# Patient Record
Sex: Female | Born: 1940 | Race: White | Hispanic: No | Smoking: Never smoker
Health system: Southern US, Community
[De-identification: ages and names within clinical notes are randomized; demographics above are authoritative.]

## PROBLEM LIST (undated history)

## (undated) DIAGNOSIS — F419 Anxiety disorder, unspecified: Secondary | ICD-10-CM

## (undated) DIAGNOSIS — J309 Allergic rhinitis, unspecified: Secondary | ICD-10-CM

## (undated) DIAGNOSIS — G43109 Migraine with aura, not intractable, without status migrainosus: Secondary | ICD-10-CM

## (undated) DIAGNOSIS — E785 Hyperlipidemia, unspecified: Secondary | ICD-10-CM

## (undated) HISTORY — DX: Allergic rhinitis, unspecified: J30.9

## (undated) HISTORY — DX: Anxiety disorder, unspecified: F41.9

## (undated) HISTORY — DX: Migraine with aura, not intractable, without status migrainosus: G43.109

## (undated) HISTORY — DX: Hyperlipidemia, unspecified: E78.5

## (undated) NOTE — *Deleted (*Deleted)
Physician Discharge Summary  Melinda Griffin AVW:[id number] DOB: June 13, 1941 DOA: 07/26/2020  PCP: Renford Dills, MD  Admit date: 07/26/2020 Discharge date: 07/27/2020  Admitted From: Home Disposition: Home with Home Health  Recommendations for Outpatient Follow-up:  1. Follow up with PCP in 1-2 weeks 2. Follow up wth Pulmonology Dr. Delton Coombes within 1-2 weeks 3. Please obtain CMP/CBC, Mag, Phos in one week 4. Please follow up on the following pending results:  Home Health: No  Equipment/Devices:***    Discharge Condition: Stable CODE STATUS: DO NOT RESUSCITATE Diet recommendation: Dysphagia 3 Diet   Brief/Interim Summary:  Melinda Griffin is a 82 y.o. female with medical history significant of anxiety, history of asthma, history of hyperlipidemia, GERD, history of migraines as well as other comorbidities as well as advanced dementia who sees a neurologist at [facility] who presents with a chief complaint of worsening hypoxia, confusion, agitation.  Patient daughter is at bedside and patient is unable to provide a subjective history at this time given her current condition advanced dementia.  Daughter states that since September she is having multiple recurrent syncopal episodes of unclear etiology.  Syncope was to be worked up in the outpatient setting with her cardiologist and Dr. Billey Gosling ordered an echocardiogram as well as carotid Doppler which were still to be done and were to be done yesterday.  Patient was found to have worsening hypoxia and daughter monitors her oxygen saturation was found to be in the low 80s.  Patient was taken to her pulmonologist who referred her to receive home oxygen DME and have home health arranged.  Patient was discharged back from her pulmonary appointment with supplemental oxygen and was called in albuterol as well as budesonide and doxycycline for possible pneumonia.  Chest x-rays obtained yesterday and shows a possible viral process.  This morning  the patient continued become very agitated and kept pulling off her supplemental oxygen via nasal cannula and when she continued be hypoxic EMS was called and EMS came and arrived and found the oxygen canister to be empty.  Patient was transported to [facility] for further evaluation given her hypoxia, confusion and agitation.  Patient has a known history of dementia and daughter states is progressively worsened in the last 4 months or so.  Patient's daughter does also state that she has a cough that is sometimes productive but most times it is not.  Patient has not had any wheezing but has been more confused and agitated and combative with the daughter.  Because of the constellation of symptoms [facility] was asked to admit this patient for acute respiratory failure with hypoxia in the setting of possible viral pneumonia and also because of her recurrent syncope of unknown etiology.  ED Course: In the ED patient had basic blood work done, a EKG done.  Chest x-ray done yesterday.  She was given a 1 L LR bolus and placed on empiric antibiotics with ceftriaxone and azithromycin.  Of note Covid testing was negative and patient received all 3 of her Covid vaccines.  Discharge Diagnoses:  Active Problems:   Acute respiratory failure (HCC)  Acute Respiratory Failure with Hypoxia with ? Viral CAP poA vs Viral Pneumonitis -Place in Obs Telemetry -Saw Pulmonology yesterday and they obtained CXR on 11/30  -CXR showed "The heart size and mediastinal contours are within normal limits. Increased prominence of interstitial lung markings is seen throughout both lungs, suspicious for viral or other atypical pneumonitis; interstitial edema is considered less likely. No evidence pulmonary consolidation or  pleural effusion." -Order for DME New O2 initiated yesterday but Home Health Arrived with an Empty O2 Cannister and Home Health never showed today -Will Treat CAP Pneumonia -Check SLP to ensure patient is not aspirating  -WBC was 9.9 and Lactic Acid was 0.9 -Will continue Empiric Abx for CAP coverage with Azithromycin and Ceftriaxone until we obtain a PCT; Was given Doxycycline by Pulmonary yesterday -Check Respiratory Virus Panel and Empiric Droplet Precautions -SpO2: 98 % O2 Flow Rate (L/min): 2 L/min -Continuous Pulse Oximetry and Maintain O2 Saturations >90% -Conitinue Supplemental O2 via [place] and wean O2 as tolerated -Singular and Breo-Ellipta were stopped at Last visit -Will start Budesonide 0.25 mg BID and Start Xopenex/Atrovent; Hold Albuterol Inhaler as patient had difficulty comprehending how to use it -If Hypoxia is not improving consider CTA of the Chest to r/o PE and Possible Pulmonary Consultation -Guaifenesin, Flutter Valve, and Incentive Spirometry -Patient sees Dr. Delton Coombes as an outpatient  -TOC Consulted for assistance with Home Health  Agitation and Worsened Encephalopathy in the setting of Hypoxia superimposed on Chronic Dementia -Given a Liter of LR in the ED  -Monitor overnight -Check U/A and Urine Cx as well as Blood Cx to r/o UTI and/or Bacteremia  -Gentle IVF hydration with NS 75 mL/hr x 1 Day  -Treat possible PNA with Abx as above  -If Encephalopathy Persists will obtain Head CT imaging; Recent Head CT Done 3 weeks ago and showed "No acute intracranial findings.Chronic microvascular angiopathy and parenchymal volume loss" -Could also be progression of Dementia -Will consult Palliative Care for Further GOC Discussion -Check TSH, RPR, B12  -Delirium Precautions -C/w Mirtazapine 15 mg po qHS and Citalopram 40 mg po Daily  -WILL NOT TRY ATIVAN or HALDOL; If patient becomes agitated will try Olanzapine 2.5 mg po DailyPRN and Soft Mitten Restraints  Recurrent Syncope -Unclear Etiology but has been happening since September  -C/w Telemetry Monitoring; No events noted on telemetry  -Troponin Flat at 3 x2  -Check ECHOCardiogram -Recent Head CT Done for Syncopal Episode Nov 9th -Check  Orthostatic VS -C/w Gentle IVF Hydration with NS at 67mL/hr -Check D-Dimer and if warranted CTA of Chest; She has a new Hypoxia and ? Related to a PE given that shes had a Hx of Recurrent Syncope -Sees Cardiology Dr. Jacques Navy as an outpatient   GERD -C/w Omeprazole 10 mg po BID substitution with Pantoprazole 40 mg po Daily   HLD -C/w Lovastatin 40 mg po qHS subsitution with Pravastatin while hospitalized   Hyperglycemia -Patient's Blood Sugar was 112 -Continue to Monitor Blood Sugars Carefully   Leukocytosis -Mild and slightly worsened -  GOC: DNR, poA -Has Advanced Directives -Daughter requests to speak with Palliative Care so will consult  Discharge Instructions   Allergies as of 07/27/2020      Reactions   Shrimp [shellfish Allergy] Anaphylaxis   Aspirin    Advised by MD previously not to take   Ceftin    Dizzy & Diarrhea   Ciprofloxacin Hcl    Dizzy, diarrhea    Med Rec must be completed prior to using this SMARTLINK***       Allergies  Allergen Reactions  . 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 Aspirin     Advised by MD previously not to take  . Ceftin     Dizzy & Diarrhea  . Ciprofloxacin Hcl     Dizzy, diarrhea    Consultations:  ***   Procedures/Studies: DG Chest 2 View  Result Date: 07/25/2020 CLINICAL DATA:  Shortness of breath. Acute respiratory failure with hypoxia. EXAM: CHEST - 2 VIEW COMPARISON:  06/14/2020 FINDINGS: The heart size and mediastinal contours are within normal limits. Increased prominence of interstitial lung markings is seen throughout both lungs, suspicious for viral or other atypical pneumonitis; interstitial edema is considered less likely. No evidence pulmonary consolidation or pleural effusion. IMPRESSION: Increased diffuse interstitial prominence, suspicious for viral or other atypical pneumonitis, with interstitial edema considered less likely. Electronically Signed   By: Danae Orleans M.D.   On: 07/25/2020 18:03    CT Head Wo Contrast  Result Date: 07/05/2020 CLINICAL DATA:  Mental status change EXAM: CT HEAD WITHOUT CONTRAST TECHNIQUE: Contiguous axial images were obtained from the base of the skull through the vertex without intravenous contrast. COMPARISON:  CT 11/04/2019 FINDINGS: Brain: No evidence of acute infarction, hemorrhage, hydrocephalus, extra-axial collection, visible mass lesion or mass effect. Symmetric prominence of the ventricles, cisterns and sulci compatible with parenchymal volume loss. Patchy and confluent areas of white matter hypoattenuation are most compatible with chronic microvascular angiopathy. Vascular: Atherosclerotic calcification of the carotid siphons and intradural vertebral arteries. No hyperdense vessel. Skull: No calvarial fracture or suspicious osseous lesion. No scalp swelling or hematoma. Sinuses/Orbits: Minimal mural thickening throughout the paranasal sinuses no layering air-fluid levels or pneumatized secretions. Mastoid air cells are well aerated. Middle ear cavities are clear. Orbital structures are unremarkable aside from prior lens extractions. Other: None. IMPRESSION: 1. No acute intracranial findings. 2. Chronic microvascular angiopathy and parenchymal volume loss. Electronically Signed   By: Kreg Shropshire M.D.   On: 07/05/2020 00:15   CT ANGIO CHEST PE W OR WO CONTRAST  Result Date: 07/27/2020 CLINICAL DATA:  Hypoxia EXAM: CT ANGIOGRAPHY CHEST WITH CONTRAST TECHNIQUE: Multidetector CT imaging of the chest was performed using the standard protocol during bolus administration of intravenous contrast. Multiplanar CT image reconstructions and MIPs were obtained to evaluate the vascular anatomy. CONTRAST:  80mL OMNIPAQUE IOHEXOL 350 MG/ML SOLN COMPARISON:  Chest radiograph July 27, 2020 FINDINGS: Cardiovascular: There is no demonstrable pulmonary embolus. There is no thoracic aortic aneurysm or dissection. There are scattered foci of calcification in visualized great  vessels. There are foci of aortic atherosclerosis as well as foci of coronary artery calcification. There is no pericardial effusion or pericardial thickening. Mediastinum/Nodes: Thyroid appears unremarkable. There is no appreciable thoracic adenopathy. No esophageal lesions are evident. Lungs/Pleura: Small granuloma noted in the right upper lobe. Mild scarring in the extreme apices. There are ill-defined areas of airspace opacity in each lower lobe region, somewhat more notable on the right than on the left. Small area of opacity in the inferior lingula, likely a small focus of pneumonia. There is bibasilar atelectasis. No appreciable pleural effusions. Upper Abdomen: Visualized upper abdominal structures appear unremarkable. 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 No blastic or lytic bone lesions. No evident chest wall lesions. Review of the MIP images confirms the above findings. IMPRESSION: 1. No evident pulmonary embolus. No thoracic aortic aneurysm or dissection. There are foci of aortic atherosclerosis as well as foci great vessel and coronary artery calcification. 2. Areas of somewhat ill-defined airspace opacity in the lower lung regions and inferior lingula, felt to represent multifocal pneumonia. Advise correlation with COVID-19 status check in this regard. No frank consolidation. 3. Small calcified granuloma right upper lobe. Scarring in the apices. 4.  No evident adenopathy. Aortic Atherosclerosis (ICD10-I70.0). Electronically Signed   By: Bretta Bang III M.D.   On: 07/27/2020 11:09   CARDIAC  EVENT MONITOR  Result Date: 07/14/2020 Indication: Syncope Monitor duration: 11 days Minimum HR (bpm): 51 Maximum HR (bpm): 141 Supraventricular Ectopy: <1% SVT: none Ventricular Ectopy: <1% NSVT: none Ventricular Tachycardia: none Pauses: none AV block: none Atrial fibrillation: none Diary events: none   Rare ectopy, no findings to support an etiology for syncope.  DG Chest  Port 1 View  Result Date: 07/27/2020 CLINICAL DATA:  Hypoxia. EXAM: PORTABLE CHEST 1 VIEW COMPARISON:  07/25/2020. FINDINGS: Mediastinum and hilar structures normal. Interim improvement of bilateral interstitial prominence. Low lung volumes with mild bibasilar atelectasis. No pleural effusion or pneumothorax. No acute bony abnormality. IMPRESSION: Interim improvement of bilateral interstitial prominence. Low lung volumes with mild bibasilar atelectasis. Electronically Signed   By: Maisie Fus  Register   On: 07/27/2020 06:04   ECHOCARDIOGRAM COMPLETE  Result Date: 07/27/2020    ECHOCARDIOGRAM REPORT   Patient Name:   Jalin HATHCOCK Burnett Date of Exam: 07/27/2020 Medical Rec #:  [id number]                 Height:       61.0 in Accession #:    [id number]                Weight:       121.3 lb Date of Birth:  1941-07-27                  BSA:          1.527 m Patient Age:    79 years                  BP:           129/56 mmHg Patient Gender: F                         HR:           78 bpm. Exam Location:  Inpatient Procedure: 2D Echo, Cardiac Doppler and Color Doppler Indications:    R55 Syncope  History:        Patient has no prior history of Echocardiogram examinations.                 Signs/Symptoms:Altered Mental Status, Shortness of Breath and                 Dyspnea; Risk Factors:Dyslipidemia. Hypoxia.  Sonographer:    Sheralyn Boatman RDCS Referring Phys: [id number] [facility] LATIF [facility]  Sonographer Comments: Technically difficult study due to poor echo windows. Image acquisition challenging due to uncooperative patient. Patient confused and would not allow me to turn her for exam. IMPRESSIONS  1. Left ventricular ejection fraction, by estimation, is 60 to 65%. The left ventricle has normal function. The left ventricle has no regional wall motion abnormalities. Left ventricular diastolic parameters are consistent with Grade I diastolic dysfunction (impaired relaxation).  2. Right ventricular systolic function is normal. The  right ventricular size is normal.  3. The mitral valve is normal in structure. Trivial mitral valve regurgitation. No evidence of mitral stenosis.  4. The aortic valve has an indeterminant number of cusps. Aortic valve regurgitation is not visualized. No aortic stenosis is present.  5. The inferior vena cava is normal in size with greater than 50% respiratory variability, suggesting right atrial pressure of 3 mmHg. FINDINGS  Left Ventricle: Left ventricular ejection fraction, by estimation, is 60 to 65%. The left ventricle has normal function. The left ventricle has no regional wall motion  abnormalities. The left ventricular internal cavity size was normal in size. There is  no left ventricular hypertrophy. Left ventricular diastolic parameters are consistent with Grade I diastolic dysfunction (impaired relaxation). Right Ventricle: The right ventricular size is normal. Right ventricular systolic function is normal. Left Atrium: Left atrial size was normal in size. Right Atrium: Right atrial size was normal in size. Pericardium: There is no evidence of pericardial effusion. Mitral Valve: The mitral valve is normal in structure. Mild mitral annular calcification. Trivial mitral valve regurgitation. No evidence of mitral valve stenosis. Tricuspid Valve: The tricuspid valve is normal in structure. Tricuspid valve regurgitation is trivial. No evidence of tricuspid stenosis. Aortic Valve: The aortic valve has an indeterminant number of cusps. Aortic valve regurgitation is not visualized. No aortic stenosis is present. Pulmonic Valve: The pulmonic valve was not well visualized. Pulmonic valve regurgitation is not visualized. No evidence of pulmonic stenosis. Aorta: The aortic root is normal in size and structure. Venous: The inferior vena cava is normal in size with greater than 50% respiratory variability, suggesting right atrial pressure of 3 mmHg.   LEFT VENTRICLE PLAX 2D LVIDd:         3.67 cm     Diastology LVIDs:          2.57 cm     LV e' medial:    7.83 cm/s LV PW:         0.96 cm     LV E/e' medial:  13.5 LV IVS:        1.00 cm     LV e' lateral:   8.05 cm/s LVOT diam:     2.10 cm     LV E/e' lateral: 13.2 LV SV:         72 LV SV Index:   47 LVOT Area:     3.46 cm  LV Volumes (MOD) LV vol d, MOD A2C: 42.9 ml LV vol d, MOD A4C: 50.2 ml LV vol s, MOD A2C: 15.9 ml LV vol s, MOD A4C: 20.6 ml LV SV MOD A2C:     27.0 ml LV SV MOD A4C:     50.2 ml LV SV MOD BP:      27.4 ml RIGHT VENTRICLE            IVC RV S prime:     9.79 cm/s  IVC diam: 1.58 cm TAPSE (M-mode): 1.8 cm LEFT ATRIUM             Index       RIGHT ATRIUM           Index LA diam:        3.40 cm 2.23 cm/m  RA Area:     10.40 cm LA Vol (A2C):   30.6 ml 20.04 ml/m RA Volume:   24.10 ml  15.78 ml/m LA Vol (A4C):   23.3 ml 15.26 ml/m LA Biplane Vol: 29.5 ml 19.32 ml/m  AORTIC VALVE LVOT Vmax:   112.00 cm/s LVOT Vmean:  69.900 cm/s LVOT VTI:    0.208 m  AORTA Ao Root diam: 3.00 cm MITRAL VALVE MV Area (PHT): 4.06 cm     SHUNTS MV Decel Time: 187 msec     Systemic VTI:  0.21 m MV E velocity: 106.00 cm/s  Systemic Diam: 2.10 cm MV A velocity: 107.00 cm/s MV E/A ratio:  0.99 Olga Millers MD Electronically signed by Olga Millers MD Signature Date/Time: 07/27/2020/11:29:30 AM    Final    VAS [place] LOWER EXTREMITY  VENOUS (DVT)  Result Date: 07/27/2020  Lower Venous DVT Study Indications: Elevated Ddimer.  Risk Factors: None identified. Comparison Study: No prior studies. Performing Technologist: Chanda Busing RVT  Examination Guidelines: A complete evaluation includes B-mode imaging, spectral Doppler, color Doppler, and power Doppler as needed of all accessible portions of each vessel. Bilateral testing is considered an integral part of a complete examination. Limited examinations for reoccurring indications may be performed as noted. The reflux portion of the exam is performed with the patient in reverse Trendelenburg.   +---------+---------------+---------+-----------+----------+--------------+ RIGHT    CompressibilityPhasicitySpontaneityPropertiesThrombus Aging +---------+---------------+---------+-----------+----------+--------------+ CFV      Full           Yes      Yes                                 +---------+---------------+---------+-----------+----------+--------------+ SFJ      Full                                                        +---------+---------------+---------+-----------+----------+--------------+ FV Prox  Full                                                        +---------+---------------+---------+-----------+----------+--------------+ FV Mid   Full                                                        +---------+---------------+---------+-----------+----------+--------------+ FV DistalFull                                                        +---------+---------------+---------+-----------+----------+--------------+ PFV      Full                                                        +---------+---------------+---------+-----------+----------+--------------+ POP      Full           Yes      Yes                                 +---------+---------------+---------+-----------+----------+--------------+ PTV      Full                                                        +---------+---------------+---------+-----------+----------+--------------+ PERO     Full                                                        +---------+---------------+---------+-----------+----------+--------------+   +---------+---------------+---------+-----------+----------+--------------+  LEFT     CompressibilityPhasicitySpontaneityPropertiesThrombus Aging +---------+---------------+---------+-----------+----------+--------------+ CFV      Full           Yes      Yes                                  +---------+---------------+---------+-----------+----------+--------------+ SFJ      Full                                                        +---------+---------------+---------+-----------+----------+--------------+ FV Prox  Full                                                        +---------+---------------+---------+-----------+----------+--------------+ FV Mid   Full                                                        +---------+---------------+---------+-----------+----------+--------------+ FV DistalFull                                                        +---------+---------------+---------+-----------+----------+--------------+ PFV      Full                                                        +---------+---------------+---------+-----------+----------+--------------+ POP      Full           Yes      Yes                                 +---------+---------------+---------+-----------+----------+--------------+ PTV      Full                                                        +---------+---------------+---------+-----------+----------+--------------+ PERO     Full                                                        +---------+---------------+---------+-----------+----------+--------------+     Summary: RIGHT: - There is no evidence of deep vein thrombosis in the lower extremity.  - No cystic structure found in the popliteal fossa.  LEFT: - There is no evidence of deep vein thrombosis in the lower extremity.  - No   cystic structure found in the popliteal fossa.  *See table(s) above for measurements and observations.    Preliminary       Subjective:   Discharge Exam: Vitals:   07/27/20 0857 07/27/20 1430  BP: (!) 123/55 (!) 123/52  Pulse: 79 82  Resp: 20 (!) 23  Temp: 98 F (36.7 C) 97.8 F (36.6 C)  SpO2: 96% 91%   Vitals:   07/27/20 0051 07/27/20 0519 07/27/20 0857 07/27/20 1430  BP: (!) 142/67 (!) 129/56 (!)  123/55 (!) 123/52  Pulse: 79 74 79 82  Resp: 16 16 20  (!) 23  Temp: 97.6 F (36.4 C) 97.6 F (36.4 C) 98 F (36.7 C) 97.8 F (36.6 C)  TempSrc: Oral Oral Oral Oral  SpO2: 100% 95% 96% 91%  Weight:      Height:        General: Pt is alert, awake, not in acute distress Cardiovascular: RRR, S1/S2 +, no rubs, no gallops Respiratory: CTA bilaterally, no wheezing, no rhonchi Abdominal: Soft, NT, ND, bowel sounds + Extremities: no edema, no cyanosis    The results of significant diagnostics from this hospitalization (including imaging, microbiology, ancillary and laboratory) are listed below for reference.     Microbiology: Recent Results (from the past 240 hour(s))  Resp Panel by RT-PCR (Flu A&B, Covid) Nasopharyngeal Swab     Status: None   Collection Time: 07/26/20  3:48 PM   Specimen: Nasopharyngeal Swab; Nasopharyngeal(NP) swabs in vial transport medium  Result Value Ref Range Status   SARS Coronavirus 2 by RT PCR NEGATIVE NEGATIVE Final    Comment: (NOTE) SARS-CoV-2 target nucleic acids are NOT DETECTED.  The SARS-CoV-2 RNA is generally detectable in upper respiratory specimens during the acute phase of infection. The lowest concentration of SARS-CoV-2 viral copies this assay can detect is 138 copies/mL. A negative result does not preclude SARS-Cov-2 infection and should not be used as the sole basis for treatment or other patient management decisions. A negative result may occur with  improper specimen collection/handling, submission of specimen other than nasopharyngeal swab, presence of viral mutation(s) within the areas targeted by this assay, and inadequate number of viral copies(<138 copies/mL). A negative result must be combined with clinical observations, patient history, and epidemiological information. The expected result is Negative.  Fact Sheet for Patients:  BloggerCourse.com  Fact Sheet for Healthcare Providers:   SeriousBroker.it  This test is no t yet approved or cleared by the [place] FDA and  has been authorized for detection and/or diagnosis of SARS-CoV-2 by FDA under an Emergency Use Authorization (EUA). This EUA will remain  in effect (meaning this test can be used) for the duration of the COVID-19 declaration under Section 564(b)(1) of the Act, 21 U.S.C.section 360bbb-3(b)(1), unless the authorization is terminated  or revoked sooner.       Influenza A by PCR NEGATIVE NEGATIVE Final   Influenza B by PCR NEGATIVE NEGATIVE Final    Comment: (NOTE) The Xpert Xpress SARS-CoV-2/FLU/RSV plus assay is intended as an aid in the diagnosis of influenza from Nasopharyngeal swab specimens and should not be used as a sole basis for treatment. Nasal washings and aspirates are unacceptable for Xpert Xpress SARS-CoV-2/FLU/RSV testing.  Fact Sheet for Patients: BloggerCourse.com  Fact Sheet for Healthcare Providers: SeriousBroker.it  This test is not yet approved or cleared by the [place] FDA and has been authorized for detection and/or diagnosis of SARS-CoV-2 by FDA under an Emergency Use Authorization (EUA). This EUA will remain in effect (meaning  this test can be used) for the duration of the COVID-19 declaration under Section 564(b)(1) of the Act, 21 U.S.C. section 360bbb-3(b)(1), unless the authorization is terminated or revoked.  Performed at [facility], [address], [place], [place] [address]   Blood culture (routine x 2)     Status: None (Preliminary result)   Collection Time: 07/26/20  5:12 PM   Specimen: BLOOD  Result Value Ref Range Status   Specimen Description   Final    BLOOD LEFT ANTECUBITAL Performed at [facility], [address] [place], [place] [address]    Special Requests   Final    BOTTLES DRAWN AEROBIC AND ANAEROBIC Blood Culture  adequate volume Performed at [facility], [address], [place], [place] [address]    Culture   Final    NO GROWTH < 12 HOURS Performed at [facility], [address] [place], [place] [address]    Report Status PENDING  Incomplete  Blood culture (routine x 2)     Status: None (Preliminary result)   Collection Time: 07/26/20  5:17 PM   Specimen: BLOOD LEFT FOREARM  Result Value Ref Range Status   Specimen Description   Final    BLOOD LEFT FOREARM Performed at [facility], [address] [place], [place] [address]    Special Requests   Final    BOTTLES DRAWN AEROBIC AND ANAEROBIC Blood Culture results may not be optimal due to an inadequate volume of blood received in culture bottles   Culture   Final    NO GROWTH < 12 HOURS Performed at [facility], [address] [place], [place] [address]    Report Status PENDING  Incomplete  Respiratory Panel by PCR     Status: None   Collection Time: 07/26/20  6:01 PM   Specimen: Nasopharyngeal Swab; Respiratory  Result Value Ref Range Status   Adenovirus NOT DETECTED NOT DETECTED Final   Coronavirus 229E NOT DETECTED NOT DETECTED Final    Comment: (NOTE) The Coronavirus on the Respiratory Panel, DOES NOT test for the novel  Coronavirus (2019 nCoV)    Coronavirus HKU1 NOT DETECTED NOT DETECTED Final   Coronavirus NL63 NOT DETECTED NOT DETECTED Final   Coronavirus OC43 NOT DETECTED NOT DETECTED Final   Metapneumovirus NOT DETECTED NOT DETECTED Final   Rhinovirus / Enterovirus NOT DETECTED NOT DETECTED Final   Influenza A NOT DETECTED NOT DETECTED Final   Influenza B NOT DETECTED NOT DETECTED Final   Parainfluenza Virus 1 NOT DETECTED NOT DETECTED Final   Parainfluenza Virus 2 NOT DETECTED NOT DETECTED Final   Parainfluenza Virus 3 NOT DETECTED NOT DETECTED Final   Parainfluenza Virus 4 NOT DETECTED NOT DETECTED Final   Respiratory Syncytial Virus NOT DETECTED NOT DETECTED Final    Bordetella pertussis NOT DETECTED NOT DETECTED Final   Chlamydophila pneumoniae NOT DETECTED NOT DETECTED Final   Mycoplasma pneumoniae NOT DETECTED NOT DETECTED Final    Comment: Performed at [facility], [address] [place], [place] [address]    Labs: BNP (last 3 results) No results for input(s): BNP in the last 8760 hours. Basic Metabolic Panel: Recent Labs  Lab 07/26/20 1540 07/27/20 0614  NA 140 139  K 3.8 3.5  CL 103 106  CO2 24 21*  GLUCOSE 112* 98  BUN 19 14  CREATININE 0.74 0.66  CALCIUM 9.2 8.4*  MG  --  1.7  PHOS  --  2.6   Liver Function Tests: Recent Labs  Lab 07/26/20 1540 07/27/20 0614  AST 18 20  ALT 16 16  ALKPHOS 69 55  BILITOT 0.7 0.5  PROT 8.1 6.7  ALBUMIN 3.5 3.0*   No results for input(s): LIPASE, AMYLASE in the last 168 hours. No results for input(s): AMMONIA in the last 168 hours. CBC: Recent Labs  Lab 07/26/20 1540 07/26/20 2200 07/27/20 0614  WBC 9.9 10.9* 11.5*  NEUTROABS 5.5  --   --   HGB 13.5 12.3 12.1  HCT 39.0 37.2 35.9*  MCV 90.3 92.8 92.5  PLT 334 312 295   Cardiac Enzymes: No results for input(s): CKTOTAL, CKMB, CKMBINDEX, TROPONINI in the last 168 hours. BNP: Invalid input(s): POCBNP CBG: Recent Labs  Lab 07/27/20 0533  GLUCAP 102*   D-Dimer Recent Labs    07/26/20 2200  DDIMER 1.03*   Hgb A1c No results for input(s): HGBA1C in the last 72 hours. Lipid Profile No results for input(s): CHOL, HDL, LDLCALC, TRIG, CHOLHDL, LDLDIRECT in the last 72 hours. Thyroid function studies Recent Labs    07/27/20 0614  TSH 0.407   Anemia work up Recent Labs    07/26/20 2200  VITAMINB12 3,633*   Urinalysis    Component Value Date/Time   COLORURINE YELLOW 07/26/2020 1705   APPEARANCEUR HAZY (A) 07/26/2020 1705   LABSPEC 1.019 07/26/2020 1705   PHURINE 5.0 07/26/2020 1705   GLUCOSEU NEGATIVE 07/26/2020 1705   HGBUR NEGATIVE 07/26/2020 1705   BILIRUBINUR NEGATIVE 07/26/2020 1705   KETONESUR 20  (A) 07/26/2020 1705   PROTEINUR NEGATIVE 07/26/2020 1705   NITRITE NEGATIVE 07/26/2020 1705   LEUKOCYTESUR LARGE (A) 07/26/2020 1705   Sepsis Labs Invalid input(s): PROCALCITONIN,  WBC,  LACTICIDVEN Microbiology Recent Results (from the past 240 hour(s))  Resp Panel by RT-PCR (Flu A&B, Covid) Nasopharyngeal Swab     Status: None   Collection Time: 07/26/20  3:48 PM   Specimen: Nasopharyngeal Swab; Nasopharyngeal(NP) swabs in vial transport medium  Result Value Ref Range Status   SARS Coronavirus 2 by RT PCR NEGATIVE NEGATIVE Final    Comment: (NOTE) SARS-CoV-2 target nucleic acids are NOT DETECTED.  The SARS-CoV-2 RNA is generally detectable in upper respiratory specimens during the acute phase of infection. The lowest concentration of SARS-CoV-2 viral copies this assay can detect is 138 copies/mL. A negative result does not preclude SARS-Cov-2 infection and should not be used as the sole basis for treatment or other patient management decisions. A negative result may occur with  improper specimen collection/handling, submission of specimen other than nasopharyngeal swab, presence of viral mutation(s) within the areas targeted by this assay, and inadequate number of viral copies(<138 copies/mL). A negative result must be combined with clinical observations, patient history, and epidemiological information. The expected result is Negative.  Fact Sheet for Patients:  BloggerCourse.com  Fact Sheet for Healthcare Providers:  SeriousBroker.it  This test is no t yet approved or cleared by the [place] FDA and  has been authorized for detection and/or diagnosis of SARS-CoV-2 by FDA under an Emergency Use Authorization (EUA). This EUA will remain  in effect (meaning this test can be used) for the duration of the COVID-19 declaration under Section 564(b)(1) of the Act, 21 U.S.C.section 360bbb-3(b)(1), unless the authorization is  terminated  or revoked sooner.       Influenza A by PCR NEGATIVE NEGATIVE Final   Influenza B by PCR NEGATIVE NEGATIVE Final    Comment: (NOTE) The Xpert Xpress SARS-CoV-2/FLU/RSV plus  assay is intended as an aid in the diagnosis of influenza from Nasopharyngeal swab specimens and should not be used as a sole basis for treatment. Nasal washings and aspirates are unacceptable for Xpert Xpress SARS-CoV-2/FLU/RSV testing.  Fact Sheet for Patients: BloggerCourse.com  Fact Sheet for Healthcare Providers: SeriousBroker.it  This test is not yet approved or cleared by the [place] FDA and has been authorized for detection and/or diagnosis of SARS-CoV-2 by FDA under an Emergency Use Authorization (EUA). This EUA will remain in effect (meaning this test can be used) for the duration of the COVID-19 declaration under Section 564(b)(1) of the Act, 21 U.S.C. section 360bbb-3(b)(1), unless the authorization is terminated or revoked.  Performed at [facility], [address], [place], [place] [address]   Blood culture (routine x 2)     Status: None (Preliminary result)   Collection Time: 07/26/20  5:12 PM   Specimen: BLOOD  Result Value Ref Range Status   Specimen Description   Final    BLOOD LEFT ANTECUBITAL Performed at [facility], [address], [place], [place] [address]    Special Requests   Final    BOTTLES DRAWN AEROBIC AND ANAEROBIC Blood Culture adequate volume Performed at [facility], [address] [place], [place] [address]    Culture   Final    NO GROWTH < 12 HOURS Performed at [facility], [address] [place], [place] [address]    Report Status PENDING  Incomplete  Blood culture (routine x 2)     Status: None (Preliminary result)   Collection Time: 07/26/20  5:17 PM   Specimen: BLOOD LEFT FOREARM  Result Value Ref Range Status   Specimen  Description   Final    BLOOD LEFT FOREARM Performed at [facility], [address], [place], [place] [address]    Special Requests   Final    BOTTLES DRAWN AEROBIC AND ANAEROBIC Blood Culture results may not be optimal due to an inadequate volume of blood received in culture bottles   Culture   Final    NO GROWTH < 12 HOURS Performed at [facility], [address], [place], [place] [address]    Report Status PENDING  Incomplete  Respiratory Panel by PCR     Status: None   Collection Time: 07/26/20  6:01 PM   Specimen: Nasopharyngeal Swab; Respiratory  Result Value Ref Range Status   Adenovirus NOT DETECTED NOT DETECTED Final   Coronavirus 229E NOT DETECTED NOT DETECTED Final    Comment: (NOTE) The Coronavirus on the Respiratory Panel, DOES NOT test for the novel  Coronavirus (2019 nCoV)    Coronavirus HKU1 NOT DETECTED NOT DETECTED Final   Coronavirus NL63 NOT DETECTED NOT DETECTED Final   Coronavirus OC43 NOT DETECTED NOT DETECTED Final   Metapneumovirus NOT DETECTED NOT DETECTED Final   Rhinovirus / Enterovirus NOT DETECTED NOT DETECTED Final   Influenza A NOT DETECTED NOT DETECTED Final   Influenza B NOT DETECTED NOT DETECTED Final   Parainfluenza Virus 1 NOT DETECTED NOT DETECTED Final   Parainfluenza Virus 2 NOT DETECTED NOT DETECTED Final   Parainfluenza Virus 3 NOT DETECTED NOT DETECTED Final   Parainfluenza Virus 4 NOT DETECTED NOT DETECTED Final   Respiratory Syncytial Virus NOT DETECTED NOT DETECTED Final   Bordetella pertussis NOT DETECTED NOT DETECTED Final   Chlamydophila pneumoniae NOT DETECTED NOT DETECTED Final   Mycoplasma pneumoniae NOT DETECTED NOT DETECTED Final  Comment: Performed at [facility], [address], [place], [place] [address]   Time coordinating discharge: 35 minutes  SIGNED:  Merlene Laughter, DO [facility] 07/27/2020, 2:44 PM Pager is on AMION  If 7PM-7AM, please contact night-coverage  [email or web address]

---

## 1973-08-26 HISTORY — PX: APPENDECTOMY: SHX54

## 1974-08-26 HISTORY — PX: TUBAL LIGATION: SHX77

## 1974-08-26 HISTORY — PX: BREAST BIOPSY: SHX20

## 1998-07-07 ENCOUNTER — Ambulatory Visit (HOSPITAL_COMMUNITY): Admission: RE | Admit: 1998-07-07 | Discharge: 1998-07-07 | Payer: Self-pay | Admitting: *Deleted

## 1999-09-21 ENCOUNTER — Ambulatory Visit (HOSPITAL_COMMUNITY): Admission: RE | Admit: 1999-09-21 | Discharge: 1999-09-21 | Payer: Self-pay | Admitting: *Deleted

## 1999-11-02 ENCOUNTER — Encounter: Admission: RE | Admit: 1999-11-02 | Discharge: 1999-11-02 | Payer: Self-pay | Admitting: Family Medicine

## 1999-11-02 ENCOUNTER — Encounter: Payer: Self-pay | Admitting: Family Medicine

## 1999-11-06 ENCOUNTER — Encounter: Admission: RE | Admit: 1999-11-06 | Discharge: 1999-11-06 | Payer: Self-pay | Admitting: Family Medicine

## 1999-11-06 ENCOUNTER — Encounter: Payer: Self-pay | Admitting: Family Medicine

## 2000-05-16 ENCOUNTER — Encounter: Payer: Self-pay | Admitting: Family Medicine

## 2000-05-16 ENCOUNTER — Encounter: Admission: RE | Admit: 2000-05-16 | Discharge: 2000-05-16 | Payer: Self-pay | Admitting: Family Medicine

## 2000-10-31 ENCOUNTER — Ambulatory Visit (HOSPITAL_COMMUNITY): Admission: RE | Admit: 2000-10-31 | Discharge: 2000-10-31 | Payer: Self-pay | Admitting: Obstetrics & Gynecology

## 2000-12-15 ENCOUNTER — Encounter: Payer: Self-pay | Admitting: Family Medicine

## 2000-12-15 ENCOUNTER — Encounter: Admission: RE | Admit: 2000-12-15 | Discharge: 2000-12-15 | Payer: Self-pay | Admitting: Family Medicine

## 2000-12-19 ENCOUNTER — Encounter: Payer: Self-pay | Admitting: Pulmonary Disease

## 2000-12-29 ENCOUNTER — Encounter: Admission: RE | Admit: 2000-12-29 | Discharge: 2000-12-29 | Payer: Self-pay | Admitting: Family Medicine

## 2000-12-29 ENCOUNTER — Encounter: Payer: Self-pay | Admitting: Family Medicine

## 2001-01-01 ENCOUNTER — Encounter: Payer: Self-pay | Admitting: Pulmonary Disease

## 2001-09-25 ENCOUNTER — Ambulatory Visit (HOSPITAL_COMMUNITY): Admission: RE | Admit: 2001-09-25 | Discharge: 2001-09-25 | Payer: Self-pay | Admitting: Obstetrics & Gynecology

## 2002-04-15 ENCOUNTER — Encounter: Admission: RE | Admit: 2002-04-15 | Discharge: 2002-04-15 | Payer: Self-pay | Admitting: Family Medicine

## 2002-04-15 ENCOUNTER — Encounter: Payer: Self-pay | Admitting: Family Medicine

## 2002-09-17 ENCOUNTER — Ambulatory Visit (HOSPITAL_COMMUNITY): Admission: RE | Admit: 2002-09-17 | Discharge: 2002-09-17 | Payer: Self-pay | Admitting: Obstetrics & Gynecology

## 2003-08-01 ENCOUNTER — Encounter: Admission: RE | Admit: 2003-08-01 | Discharge: 2003-08-01 | Payer: Self-pay | Admitting: Family Medicine

## 2003-12-29 ENCOUNTER — Encounter: Admission: RE | Admit: 2003-12-29 | Discharge: 2003-12-29 | Payer: Self-pay | Admitting: Specialist

## 2004-05-10 ENCOUNTER — Other Ambulatory Visit: Admission: RE | Admit: 2004-05-10 | Discharge: 2004-05-10 | Payer: Self-pay | Admitting: Family Medicine

## 2004-05-31 ENCOUNTER — Other Ambulatory Visit: Admission: RE | Admit: 2004-05-31 | Discharge: 2004-05-31 | Payer: Self-pay | Admitting: Family Medicine

## 2004-11-19 ENCOUNTER — Encounter: Admission: RE | Admit: 2004-11-19 | Discharge: 2004-11-19 | Payer: Self-pay | Admitting: Family Medicine

## 2004-11-27 ENCOUNTER — Ambulatory Visit (HOSPITAL_COMMUNITY): Admission: RE | Admit: 2004-11-27 | Discharge: 2004-11-27 | Payer: Self-pay | Admitting: Gastroenterology

## 2005-06-21 ENCOUNTER — Encounter: Admission: RE | Admit: 2005-06-21 | Discharge: 2005-06-21 | Payer: Self-pay | Admitting: Family Medicine

## 2005-09-18 ENCOUNTER — Ambulatory Visit: Payer: Self-pay | Admitting: Pulmonary Disease

## 2005-12-25 ENCOUNTER — Encounter: Admission: RE | Admit: 2005-12-25 | Discharge: 2005-12-25 | Payer: Self-pay | Admitting: Family Medicine

## 2007-02-04 ENCOUNTER — Encounter: Admission: RE | Admit: 2007-02-04 | Discharge: 2007-02-04 | Payer: Self-pay | Admitting: Family Medicine

## 2007-02-09 ENCOUNTER — Ambulatory Visit: Payer: Self-pay | Admitting: *Deleted

## 2008-06-01 ENCOUNTER — Encounter: Admission: RE | Admit: 2008-06-01 | Discharge: 2008-06-01 | Payer: Self-pay | Admitting: *Deleted

## 2009-01-27 ENCOUNTER — Encounter: Admission: RE | Admit: 2009-01-27 | Discharge: 2009-01-27 | Payer: Self-pay | Admitting: Internal Medicine

## 2009-08-07 ENCOUNTER — Encounter: Admission: RE | Admit: 2009-08-07 | Discharge: 2009-08-07 | Payer: Self-pay | Admitting: Internal Medicine

## 2010-01-08 ENCOUNTER — Encounter: Admission: RE | Admit: 2010-01-08 | Discharge: 2010-01-08 | Payer: Self-pay | Admitting: Internal Medicine

## 2010-01-08 ENCOUNTER — Encounter: Payer: Self-pay | Admitting: Pulmonary Disease

## 2010-05-09 ENCOUNTER — Ambulatory Visit: Payer: Self-pay | Admitting: Pulmonary Disease

## 2010-05-09 ENCOUNTER — Encounter: Payer: Self-pay | Admitting: Pulmonary Disease

## 2010-05-09 DIAGNOSIS — I10 Essential (primary) hypertension: Secondary | ICD-10-CM | POA: Insufficient documentation

## 2010-05-09 DIAGNOSIS — M1990 Unspecified osteoarthritis, unspecified site: Secondary | ICD-10-CM | POA: Insufficient documentation

## 2010-05-09 DIAGNOSIS — J309 Allergic rhinitis, unspecified: Secondary | ICD-10-CM | POA: Insufficient documentation

## 2010-06-06 ENCOUNTER — Telehealth: Payer: Self-pay | Admitting: Pulmonary Disease

## 2010-08-29 ENCOUNTER — Ambulatory Visit
Admission: RE | Admit: 2010-08-29 | Discharge: 2010-08-29 | Payer: Self-pay | Source: Home / Self Care | Attending: Pulmonary Disease | Admitting: Pulmonary Disease

## 2010-09-05 ENCOUNTER — Encounter
Admission: RE | Admit: 2010-09-05 | Discharge: 2010-09-05 | Payer: Self-pay | Source: Home / Self Care | Attending: Internal Medicine | Admitting: Internal Medicine

## 2010-09-25 NOTE — Progress Notes (Signed)
Summary: advair hfa and chlor tabs working  Phone Note Call from Patient   Caller: Patient Call For: [facility] Summary of Call: PT CALLING TO GIVE UPDATE ON MEDICATION CHANGE ( ADVAIR HFA) Initial call taken by: Rickard Patience,  June 06, 2010 11:50 AM  Follow-up for Phone Call        [email or web address]  Philipp Deputy [facility]  June 06, 2010 1:56 PM  Pt returned call. Tivis Ringer, CNA  June 06, 2010 2:21 PM  spoke with the patient and she states the Advair HFA 150 and chlor tabs are working well. It has been 3.5 weeks and her hoarseness has gone as well as her productive cough. I advised I will forward message to [facility]. Carron Curie CMA  June 06, 2010 2:50 PM   Additional Follow-up for Phone Call Additional follow up Details #1::        would like her to stay on advair hfa, and to use chlor tabs as needed for her allergies/postnasal drip would like to see her back in 3mos to check on her progress. Additional Follow-up by: Barbaraann Share MD,  June 06, 2010 4:55 PM    Additional Follow-up for Phone Call Additional follow up Details #2::    pt advised and reminder set fro 3 months. Carron Curie CMA  June 06, 2010 5:04 PM   New/Updated Medications: ADVAIR HFA 115-21 MCG/ACT AERO (FLUTICASONE-SALMETEROL) one puff twice daily  Appended Document: advair hfa and chlor tabs working correction: pt scheduled to see [facility] on 08-27-10 at 11:45.

## 2010-09-25 NOTE — Consult Note (Signed)
Summary: Education [facility], [facility]   Imported By: Sherian Rein 05/10/2010 07:07:20  _____________________________________________________________________  External Attachment:    Type:   Image     Comment:   External Document

## 2010-09-25 NOTE — Assessment & Plan Note (Signed)
Summary: self referral for sob, h/o asthma   Visit Type:  Self referral Primary Provider/Referring Provider:  Dr. Waymon Griffin [facility])   CC:  asthma and sob.  History of Present Illness: The pt is a 69y/o female who I have been asked to see for dyspnea.  She has a longstanding h/o asthma, and has been treated with advair for some time with success.  She is now having issues with her breathing whenever she is exposed to strong odors and other allergens.  She begins to feel a fullness in her throat and upper chest, gets more sob, and begins to cough to try and "get something out that is stuck in throat".  The pt has dysphonia with her advair use that comes and goes, and has postnasal drip despite using nasal ICS.  She is not taking an antihistamine for allergies.  She is very active, and has not seen any decrement in her exercise tolerance.  She has had a recent cxr in May that was normal.  She denies any reflux symptoms. Ch Current Medications (verified): 1)  Ventolin Hfa 108 (90 Base) Mcg/act Aers (Albuterol Sulfate) .... 2 Puffs Every 4 Hours As Needed 2)  Advair Diskus 250-50 Mcg/dose Aepb (Fluticasone-Salmeterol) .... One Puff Two Times A Day 3)  Fluticasone Propionate 50 Mcg/act Susp (Fluticasone Propionate) .... One Spray in Each Nostrile Two Times A Day 4)  Tavist Allergy 1.34 Mg Tabs (Clemastine Fumarate) .... Once Daily 5)  Tramadol Hcl 50 Mg Tabs (Tramadol Hcl) .... Once Daily 6)  Lovastatin 10 Mg Tabs (Lovastatin) .... Once Daily 7)  Vitamin D3 50000 Unit Caps (Cholecalciferol) .... Once A Month 8)  Calcium 500/d 500-400 Mg-Unit Chew (Calcium-Vitamin D) .... Once Daily  Allergies (verified): 1)  ! Aspirin  Past History:  Past Medical History: OSTEOARTHRITIS (ICD-715.90) HYPERLIPIDEMIA (ICD-272.4) HYPERTENSION (ICD-401.9) ASTHMA (ICD-493.90)  Past Surgical History: Reviewed history from 05/08/2010 and no changes required. Appendectomy bilateral tubal  ligation  Family History: Reviewed history from 05/08/2010 and no changes required. Asthma-Father and sister Allergies--father and sister heart disease-sister brain cancer--mother breast cancer--mother  Social History: Reviewed history from 05/08/2010 and no changes required. She is essentially a non-smoker. She ahs tried during her teenage years but has not smoked in 50 years.  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    Vital Signs:  Patient profile:   70 year old female Height:      62 inches Weight:      130.38 pounds BMI:     23.93 O2 Sat:      98 % on Room air Temp:     98.1 degrees F oral Pulse rate:   77 / minute BP sitting:   122 / 74  (right arm) Cuff size:   regular  Vitals Entered By: Carver Fila (May 09, 2010 11:01 AM)  O2 Flow:  Room air CC: asthma, sob Comments meds and allergies udpated Phone number updated Melinda Griffin  May 09, 2010 11:01 AM    Physical Exam  General:  wd female in nad Eyes:  PERRLA and EOMI.  Nose:  deviated septum to left with narrowing +inflammation of nasal mucosa no purulence seen. Mouth:  clear  Neck:  no jvd, tmg, LN Lungs:  clear to auscultation Heart:  rrr, no mrg Abdomen:  soft and nontender, bs+ Extremities:  no edema or cyanosis, pulses intact distally Neurologic:  alert and oriented, moves all 4.   Impression & Recommendations:  Problem # 1:  ASTHMA  (ICD-493.90) the pt has a h/o asthma that appears to be in good control.  She has normal spirometry today, and has no change in her excellent exercise tolerance.  I do not believe her current symptoms are coming from her chest, but rather from upper airway irritation related to her advair diskus and postnasal drip.  Will change her to advair HFA as a trial to see if helps irritation of her upper airway.  Problem # 2:  ALLERGIC RHINITIS (ICD-477.9) I have asked the pt to continue with her nasal steroid spray, and to try otc chlorpheniramine in the place of tavist.  Medications Added to Medication List This Visit: 1)  Ventolin Hfa 108 (90 Base) Mcg/act Aers (Albuterol sulfate) .... 2 puffs every 4 hours as needed 2)  Advair Diskus 250-50 Mcg/dose Aepb (Fluticasone-salmeterol) .... One puff two times a day 3)  Fluticasone Propionate 50 Mcg/act Susp (Fluticasone propionate) .... One spray in each nostrile two times a day 4)  Tavist Allergy 1.34 Mg Tabs (Clemastine fumarate) .... Once daily 5)  Tramadol Hcl 50 Mg Tabs (Tramadol hcl) .... Once daily 6)  Lovastatin 10 Mg Tabs (Lovastatin) .... Once daily 7)  Vitamin D3 50000 Unit Caps (Cholecalciferol) .... Once a month 8)  Calcium 500/d 500-400 Mg-unit Chew (Calcium-vitamin d) .... Once daily  Other Orders: New Patient Level IV ([address]) Spirometry w/Graph (94[address])  Patient Instructions: 1)  trial of chlorpheniramine 8mg  at bedtime in the place of tavist. 2)  Will try you on advair HFA in the place of the dry powder to see if makes a difference in your symptoms. 3)  please give me feedback in 3 weeks.   Immunization History:  Influenza Immunization History:    Influenza:  historical (05/26/2009)  Pneumovax Immunization History:    Pneumovax:  historical (04/27/2007)

## 2010-09-27 NOTE — Assessment & Plan Note (Signed)
Summary: rov for asthma   Primary Provider/Referring Provider:  Dr. Waymon Amato [facility])   CC:  3 month f/u. pt states her breathing has been "good'. Pt states she has no new concersn today. pt denies any cough. pt is never smoker.  History of Present Illness: the pt comes in today for f/u of her known asthma.  She is tolerating her current inhaler regimen well, with no worsening cough or dysphonia.  She feels her symptoms are under control, and has not required her rescue inhaler.  She has not had any recent acute exacerbations.  Current Medications (verified): 1)  Ventolin Hfa 108 (90 Base) Mcg/act Aers (Albuterol Sulfate) .... 2 Puffs Every 4 Hours As Needed 2)  Advair Hfa 115-21 Mcg/act Aero (Fluticasone-Salmeterol) .... One Puff Twice Daily 3)  Fluticasone Propionate 50 Mcg/act Susp (Fluticasone Propionate) .... One Spray in Each Nostrile Once Daily 4)  Tavist Allergy 1.34 Mg Tabs (Clemastine Fumarate) .... Once Daily 5)  Lovastatin 10 Mg Tabs (Lovastatin) .... Every Other Day 6)  Vitamin D3 50000 Unit Caps (Cholecalciferol) .... Once A Month 7)  Calcium 500/d 500-400 Mg-Unit Chew (Calcium-Vitamin D) .... Once Daily 8)  Probiotic  Caps (Probiotic Product) .... Once Daily 9)  Lexapro 20 Mg Tabs (Escitalopram Oxalate) .... Once Daily  Allergies (verified): 1)  ! Aspirin  Review of Systems       The patient complains of anxiety.  The patient denies shortness of breath with activity, shortness of breath at rest, productive cough, non-productive cough, coughing up blood, chest pain, irregular heartbeats, acid heartburn, indigestion, loss of appetite, weight change, abdominal pain, difficulty swallowing, sore throat, tooth/dental problems, headaches, nasal congestion/difficulty breathing through nose, sneezing, itching, ear ache, depression, hand/feet swelling, joint stiffness or pain, rash, change in color of mucus, and fever.    Vital Signs:  Patient profile:   70 year old  female Height:      62 inches Weight:      126 pounds BMI:     23.13 O2 Sat:      98 % on Room air Temp:     97.6 degrees F oral Pulse rate:   86 / minute BP sitting:   136 / 64  (left arm) Cuff size:   regular  Vitals Entered By: Carver Fila (August 29, 2010 9:59 AM)  O2 Flow:  Room air CC: 3 month f/u. pt states her breathing has been "good'. Pt states she has no new concersn today. pt denies any cough. pt is never smoker Comments meds and allergies updated Phone number updated Mindy Silva  August 29, 2010 10:00 AM    Physical Exam  General:  wd female in nad Nose:  no purulence or discharge noted. Lungs:  totally clear to auscultation Heart:  rrr, no mrg Extremities:  no edema or cyanosis  Neurologic:  alert and oriented, moves all 4.   Impression & Recommendations:  Problem # 1:  ASTHMA (ICD-493.90) the pt is doing very well on her current regimen.  She is not having dysphonia or throat irritation on the advair HFA.  She has not required her rescue inhaler since the last visit.  I have asked her to stay on her current meds, and to f/u with me in one year.  Medications Added to Medication List This Visit: 1)  Fluticasone Propionate 50 Mcg/act Susp (Fluticasone propionate) .... One spray in each nostrile once daily 2)  Lovastatin 10 Mg Tabs (Lovastatin) .... Every other day 3)  Probiotic Caps (Probiotic product) .... Once daily 4)  Lexapro 20 Mg Tabs (Escitalopram oxalate) .... Once daily  Other Orders: Est. Patient Level III ([address])  Patient Instructions: 1)  no change in current meds. 2)  followup with me in one year, or sooner if having issues.   Immunization History:  Influenza Immunization History:    Influenza:  historical (04/26/2010)

## 2011-01-11 NOTE — Op Note (Signed)
NAME:  Melinda Griffin, Melinda Griffin            ACCOUNT NO.:  [id number]   MEDICAL RECORD NO.:  [id number]          PATIENT TYPE:  AMB   LOCATION:  ENDO                         FACILITY:  [facility]   PHYSICIAN:  Bernette Redbird, M.D.   DATE OF BIRTH:  1941/04/18   DATE OF PROCEDURE:  11/27/2004  DATE OF DISCHARGE:                                 OPERATIVE REPORT   PROCEDURE:  Colonoscopy.   INDICATION:  70 year old female without worrisome risk factors or symptoms,  for first ever colonoscopy screening.   FINDINGS:  Normal exam to the terminal ileum.   PROCEDURE:  The nature, purpose and risks of the procedure had been  discussed with the patient, who provided written consent. Sedation was  fentanyl 70 mcg and Versed 7 milligrams IV without arrhythmias or  desaturation. The Olympus adjustable tension pediatric video colonoscope was  advanced with mild difficulty through an angulated sigmoid region, made  easier by turning the patient into the supine position. The cecum was  identified by clear visualization of the appendiceal orifice and ileocecal  valve and the terminal ileum was entered for short distance and appeared  normal and pullback was then performed. The quality of prep was excellent.  It is felt that all areas were well seen.   This was a normal examination. No polyps, cancer, colitis, vascular  malformations or diverticulosis were noted. No biopsies were obtained. The  patient tolerated procedure well and there no apparent complications.   IMPRESSION:  Normal colonoscopy in a standard risk individual (V76.51).   PLAN:  Consider sigmoidoscopic evaluation in five years for continued  screening.      RB/MEDQ  D:  11/27/2004  T:  11/27/2004  Job:  [id number]   cc:   Talmadge Coventry, M.D.  [address]  [place]  [place] [address]  Fax: [phone]

## 2011-01-22 ENCOUNTER — Other Ambulatory Visit: Payer: Self-pay | Admitting: Pulmonary Disease

## 2011-06-27 ENCOUNTER — Telehealth: Payer: Self-pay | Admitting: Pulmonary Disease

## 2011-06-27 NOTE — Telephone Encounter (Signed)
Called and spoke with pt. She states that she has been having some wheezing over the past few wks and increased SOB. She relates this to the change in weather and allergies. She was seen by her PCP and is currently taking prednisone taper. She states that this has really helped. She is still wheezing a little. Her PCP advised her that she may benefit from albuterol nebs and was going to give her rx for this but pt declined stating wanted to get [facility] input first. I advised may need ov since having issues with asthma. She declined at this time stating has too much going on with her schedule. KC, please advise, thanks!

## 2011-06-28 ENCOUNTER — Encounter: Payer: Self-pay | Admitting: Pulmonary Disease

## 2011-06-28 NOTE — Telephone Encounter (Signed)
Spoke with pt and notified of recs per [facility]. Pt verbalized understanding and appt was sched for 07/01/11 at 2 pm.

## 2011-06-28 NOTE — Telephone Encounter (Signed)
Let pt know that she needs ov to discuss asthma management.  Wheezing can be in upper airway or lower airway, so may or may not be due to asthma.  We do not use nebulizers for adult asthma patients, because if they need nebs, they are not on the right controller medications.  We have to decide about changing her maintenance medications if the wheezing is truly coming from her chest.

## 2011-07-01 ENCOUNTER — Encounter: Payer: Self-pay | Admitting: Pulmonary Disease

## 2011-07-01 ENCOUNTER — Ambulatory Visit (INDEPENDENT_AMBULATORY_CARE_PROVIDER_SITE_OTHER): Payer: Medicare Other | Admitting: Pulmonary Disease

## 2011-07-01 VITALS — BP 138/74 | HR 74 | Temp 97.6°F | Ht 62.0 in | Wt 128.2 lb

## 2011-07-01 DIAGNOSIS — J45901 Unspecified asthma with (acute) exacerbation: Secondary | ICD-10-CM | POA: Insufficient documentation

## 2011-07-01 MED ORDER — PREDNISONE 20 MG PO TABS: ORAL_TABLET | ORAL | Status: DC

## 2011-07-01 MED ORDER — MONTELUKAST SODIUM 10 MG PO TABS: 10.0000 mg | ORAL_TABLET | Freq: Every day | ORAL | Status: DC

## 2011-07-01 NOTE — Progress Notes (Signed)
  Subjective:    Patient ID: Melinda Griffin, female    DOB: [birth year], 70 y.o.   MRN: [id number]  HPI The patient comes in today for an acute sick visit.  She has known asthma with an allergic component, and gives a two-week history that is consistent with an acute exacerbation.  She had increasing shortness of breath, chest tightness, and also wheezing.  She was treated with a short course of low-dose prednisone by her primary physician, but has not returned to baseline.  She feels that she is only 50% better, and continues to have chest symptoms.  She believes this was triggered by her significant fall allergies, and all of the large quantities of leaves.  She denies any significant purulence.   Review of Systems  Constitutional: Negative for fever and unexpected weight change.  HENT: Negative for ear pain, nosebleeds, congestion, sore throat, rhinorrhea, sneezing, trouble swallowing, dental problem, postnasal drip and sinus pressure.   Eyes: Negative for redness and itching.  Respiratory: Positive for cough, chest tightness, shortness of breath and wheezing.   Cardiovascular: Negative for palpitations and leg swelling.  Gastrointestinal: Negative for nausea and vomiting.  Genitourinary: Negative for dysuria.  Musculoskeletal: Negative for joint swelling.  Skin: Negative for rash.  Neurological: Negative for headaches.  Hematological: Does not bruise/bleed easily.  Psychiatric/Behavioral: Negative for dysphoric mood. The patient is not nervous/anxious.        Objective:   Physical Exam Well developed female in no acute distress Nose without purulence or discharge noted Chest with scattered wheezes and rhonchi, but adequate air flow Cardiac exam with regular rate and rhythm Lower extremities without edema, no cyanosis noted Alert and oriented, moves all 4 extremities.       Assessment & Plan:

## 2011-07-01 NOTE — Assessment & Plan Note (Signed)
The patient has an acute asthma exacerbation which has persisted despite low dose prednisone taper.  I suspect from her description that it is related to her known fall allergies.  She has persistent bronchospasm on exam today, and therefore will need a higher strength prednisone taper.  We'll also add Singulair to her regimen to treat the allergic component.  I suspect that she can use this from September through December, and give herself a drug holiday the rest of the months.

## 2011-07-01 NOTE — Patient Instructions (Signed)
Stay on advair Will treat with a higher dose of prednisone to get you thru this episode Start singulair 10mg  one each day thru the middle of Dec, then can try to come off until next fall.   Keep followup apptm with me in Jan.

## 2011-07-05 ENCOUNTER — Telehealth: Payer: Self-pay | Admitting: Pulmonary Disease

## 2011-07-05 NOTE — Telephone Encounter (Signed)
singulair does not cause hoarseness. Prednisone does not cause cough. advair can cause cough and hoarseness, so make sure she is really rinsing and gargling well with water after using.  Should also gargle and swallow as the final thing Can try zyrtec 10mg  at bedtime for postnasal drip to see if helps hoarseness. Call us if issue persists.

## 2011-07-05 NOTE — Telephone Encounter (Signed)
Pt c/o increased hoarseness since Wed., 11/7 and wants to know if this can be a side effect of Singulair. She also says she is coughing more since on increased dose of the Prednisone but feels this is a good thing since the mucus is coming up and out of her chest. Rinsing well after using Advair each and every time. She denies any allergy drainage or sinus problems.Pls advise.

## 2011-07-05 NOTE — Telephone Encounter (Signed)
Spoke with pt and notified of recs per [facility]. Pt verbalized understanding and states will call us back if not improving.

## 2011-09-05 ENCOUNTER — Other Ambulatory Visit: Payer: Self-pay | Admitting: Internal Medicine

## 2011-09-11 ENCOUNTER — Other Ambulatory Visit: Payer: Self-pay | Admitting: Internal Medicine

## 2011-09-11 DIAGNOSIS — Z1231 Encounter for screening mammogram for malignant neoplasm of breast: Secondary | ICD-10-CM

## 2011-09-23 ENCOUNTER — Ambulatory Visit (INDEPENDENT_AMBULATORY_CARE_PROVIDER_SITE_OTHER): Payer: Medicare Other | Admitting: Pulmonary Disease

## 2011-09-23 ENCOUNTER — Encounter: Payer: Self-pay | Admitting: Pulmonary Disease

## 2011-09-23 VITALS — BP 120/72 | HR 76 | Temp 98.4°F | Ht 62.0 in | Wt 128.4 lb

## 2011-09-23 DIAGNOSIS — J45909 Unspecified asthma, uncomplicated: Secondary | ICD-10-CM

## 2011-09-23 NOTE — Assessment & Plan Note (Signed)
The patient is doing well on her current regimen, with the addition of Singulair for her typical winter allergies.  She is going to continue on her current medication regimen, and consider discontinuing the Singulair when the weather is warmer.  She typically does not have spring and summer allergies.  She will followup with me in one year, or sooner if having issues.

## 2011-09-23 NOTE — Progress Notes (Signed)
  Subjective:    Patient ID: Melinda Griffin, female    DOB: 1940/10/24, 71 y.o.   MRN: [id number]  HPI Patient Monday for followup of her asthma, with both intrinsic and extrinsic components.  At the last visit, she was treated with a short course of prednisone for an acute exacerbation, and also started on Singulair for her winter allergy symptoms.  She feels the Singulair has helped her quite a bit, and would like to continue this until the weather warms up.  She's had no further exacerbation, nor has she had to use her rescue inhaler.   Review of Systems  Constitutional: Negative for fever and unexpected weight change.  HENT: Negative for ear pain, nosebleeds, congestion, sore throat, rhinorrhea, sneezing, trouble swallowing, dental problem, postnasal drip and sinus pressure.   Eyes: Negative for redness and itching.  Respiratory: Negative for cough, chest tightness, shortness of breath and wheezing.   Cardiovascular: Negative for palpitations and leg swelling.  Gastrointestinal: Negative for nausea and vomiting.  Genitourinary: Negative for dysuria.  Musculoskeletal: Negative for joint swelling.  Skin: Negative for rash.  Neurological: Negative for headaches.  Hematological: Does not bruise/bleed easily.  Psychiatric/Behavioral: Negative for dysphoric mood. The patient is not nervous/anxious.        Objective:   Physical Exam Well-developed female in no acute distress Nose without purulence or discharge noted Chest totally clear to auscultation Heart exam with regular rate and rhythm Lower extremities without edema, no cyanosis Alert and oriented, moves all 4 extremities.       Assessment & Plan:

## 2011-09-23 NOTE — Patient Instructions (Signed)
Stay on asthma medications Stay on singulair thru your winter allergy symptoms. followup with me in one year, but call if having issues.

## 2011-10-03 ENCOUNTER — Ambulatory Visit: Payer: Medicare Other

## 2011-10-04 ENCOUNTER — Ambulatory Visit
Admission: RE | Admit: 2011-10-04 | Discharge: 2011-10-04 | Disposition: A | Discharge status: Home / Self Care | Payer: Medicare Other | Source: Ambulatory Visit | Attending: Internal Medicine | Admitting: Internal Medicine

## 2011-10-04 ENCOUNTER — Other Ambulatory Visit: Payer: Self-pay | Admitting: Internal Medicine

## 2011-10-04 DIAGNOSIS — Z780 Asymptomatic menopausal state: Secondary | ICD-10-CM

## 2011-10-04 DIAGNOSIS — Z1231 Encounter for screening mammogram for malignant neoplasm of breast: Secondary | ICD-10-CM

## 2011-10-08 ENCOUNTER — Other Ambulatory Visit: Payer: Medicare Other

## 2011-10-21 ENCOUNTER — Ambulatory Visit
Admission: RE | Admit: 2011-10-21 | Discharge: 2011-10-21 | Disposition: A | Discharge status: Home / Self Care | Payer: Medicare Other | Source: Ambulatory Visit | Attending: Internal Medicine | Admitting: Internal Medicine

## 2011-10-21 DIAGNOSIS — Z780 Asymptomatic menopausal state: Secondary | ICD-10-CM

## 2011-10-29 ENCOUNTER — Encounter: Payer: Self-pay | Admitting: Pulmonary Disease

## 2011-10-29 ENCOUNTER — Ambulatory Visit (INDEPENDENT_AMBULATORY_CARE_PROVIDER_SITE_OTHER): Payer: Medicare Other | Admitting: Pulmonary Disease

## 2011-10-29 ENCOUNTER — Telehealth: Payer: Self-pay | Admitting: Pulmonary Disease

## 2011-10-29 VITALS — BP 104/72 | HR 99 | Temp 98.0°F | Ht 61.0 in | Wt 125.4 lb

## 2011-10-29 DIAGNOSIS — J45901 Unspecified asthma with (acute) exacerbation: Secondary | ICD-10-CM

## 2011-10-29 MED ORDER — AZITHROMYCIN 250 MG PO TABS: ORAL_TABLET | ORAL | Status: AC

## 2011-10-29 MED ORDER — PREDNISONE 10 MG PO TABS: ORAL_TABLET | ORAL | Status: DC

## 2011-10-29 NOTE — Patient Instructions (Signed)
Prednisone 10 mg pills: 3 pills daily for 2 days, then 2 pills daily for 2 days, then 1 pill daily for 2 days, then 1/2 pill daily for 2 days Zithromax: Two pills on first day, then one pill daily for 4 days Follow up with Dr. Shelle Iron in 4 to 6 weeks

## 2011-10-29 NOTE — Progress Notes (Signed)
Chief Complaint  Patient presents with  . Acute visit    [facility]ance patient...pt c/o increased sob, runny nose, and cough with yellow mucus x 5-6 days.     History of Present Illness: Melinda Griffin is a 71 y.o. female with hx of asthma with acute bronchitis.  She is well known to Dr. Shelle Iron and has been followed by him for almost 15 years.  She got a cold from her family members about one week ago.  She has been getting worse.  She has runny nose, cough with clear to yellow sputum, and scratchy throat.  She is having trouble taking deep breaths, and has chest tightness and wheeze.  She denies fever.  She does not have much appetite or energy.  She denies nausea, diarrhea, gland swelling, skin rash, epistaxis, or hemoptysis.    Past Medical History  Diagnosis Date  . Osteoarthritis   . Hyperlipidemia   . Hypertension   . Asthma     Past Surgical History  Procedure Date  . Appendectomy   . Tubal ligation     bilateral    Allergies  Allergen Reactions  . Aspirin   . Ceftin   . Ciprofloxacin Hcl     Dizzy, diarrhea    Physical Exam:  Blood pressure 104/72, pulse 99, temperature 98 F (36.7 C), temperature source Oral, height 5\' 1"  (1.549 m), weight 125 lb 6.4 oz (56.881 kg), SpO2 96.00%. Body mass index is 23.69 kg/(m^2). Wt Readings from Last 2 Encounters:  10/29/11 125 lb 6.4 oz (56.881 kg)  09/23/11 128 lb 6.4 oz (58.242 kg)    General - No distress, speak in full sentences HEENT - clear nasal discharge, no sinus tenderness, TM clear, no oral exudate, no LAN Cardiac - s1s2 regular Chest - b/l expiratory wheeze Abdomen - soft, nontender Extremities - no edema Skin - no rashes Neurologic - normal strength Psychiatric - normal mood, behavior   Assessment/Plan:  Outpatient Encounter Prescriptions as of 10/29/2011  Medication Sig Dispense Refill  . ADVAIR HFA 115-21 MCG/ACT inhaler USE 1 PUFF TWICE A DAY  1 Inhaler  11  . albuterol (VENTOLIN HFA) 108 (90 BASE)  MCG/ACT inhaler Inhale 2 puffs into the lungs every 4 (four) hours as needed.        . Calcium Carb-Cholecalciferol (CALCIUM 500 +D) 500-400 MG-UNIT TABS Take 1 tablet by mouth daily.        . Cholecalciferol (VITAMIN D3) 50000 UNITS CAPS Take 1 capsule by mouth every 30 (thirty) days.        Marland Kitchen escitalopram (LEXAPRO) 20 MG tablet Take 20 mg by mouth daily.        . fexofenadine (ALLEGRA) 180 MG tablet Take 180 mg by mouth daily.        . fluticasone (FLONASE) 50 MCG/ACT nasal spray Place 1 spray into the nose daily.        Marland Kitchen lovastatin (MEVACOR) 10 MG tablet Take 10 mg by mouth daily.       . montelukast (SINGULAIR) 10 MG tablet Take 1 tablet (10 mg total) by mouth daily.  30 tablet  6  . PROBIOTIC CAPS Take 1 capsule by mouth daily.          Dabney Dever Pager:  [phone] 10/29/2011, 4:18 PM

## 2011-10-29 NOTE — Telephone Encounter (Signed)
Spoke with pt and she states having increased SOB and cough x several days. She states was unable to use rescue inhaler b/c she could not take in a deep enough breath. OV with VS today at 3:45 pm.

## 2011-10-29 NOTE — Assessment & Plan Note (Signed)
Likely related to acute bronchitis.  Will give course of zithromax and prednisone taper.  She is to continue her other asthma regimen.  Will schedule for f/u with Dr. Shelle Iron in 4 to 6 weeks.  Advised her she can cancel if she is feeling better, and then follow as usual.

## 2011-12-03 ENCOUNTER — Ambulatory Visit (INDEPENDENT_AMBULATORY_CARE_PROVIDER_SITE_OTHER): Payer: Medicare Other | Admitting: Pulmonary Disease

## 2011-12-03 ENCOUNTER — Encounter: Payer: Self-pay | Admitting: Pulmonary Disease

## 2011-12-03 VITALS — BP 118/58 | HR 78 | Temp 98.1°F | Ht 62.0 in | Wt 125.2 lb

## 2011-12-03 DIAGNOSIS — J45909 Unspecified asthma, uncomplicated: Secondary | ICD-10-CM

## 2011-12-03 MED ORDER — FLUTICASONE-SALMETEROL 230-21 MCG/ACT IN AERO: 1.0000 | INHALATION_SPRAY | Freq: Two times a day (BID) | RESPIRATORY_TRACT | Status: DC

## 2011-12-03 NOTE — Assessment & Plan Note (Signed)
The patient is doing well overall from an asthma standpoint, however has now had 2 exacerbations in a four-month period.  It is unclear if this is related to under controlled airway inflammation, or whether it is more associated with extrinsic factors.  Because she has had 2 flareups in a short period of time, I would like to give her a trial of increase strength of Advair.  I have also asked her to continue her Singulair through the spring allergy season but can take a holiday during the summer.  She will also need for the fall allergy season.

## 2011-12-03 NOTE — Progress Notes (Signed)
  Subjective:    Patient ID: Melinda Griffin, female    DOB: [birth year], 71 y.o.   MRN: [id number]  HPI The patient comes in to day for followup of her known asthma.  She recently had an acute exacerbation last month, and required treatment with antibiotics and a prednisone pulse.  She is back to her usual baseline, and feels that she is doing well.   Review of Systems  Constitutional: Negative for fever and unexpected weight change.  HENT: Negative for ear pain, nosebleeds, congestion, sore throat, rhinorrhea, sneezing, trouble swallowing, dental problem, postnasal drip and sinus pressure.   Eyes: Negative for redness and itching.  Respiratory: Negative for cough, chest tightness, shortness of breath and wheezing.   Cardiovascular: Negative for palpitations and leg swelling.  Gastrointestinal: Negative for nausea and vomiting.  Genitourinary: Negative for dysuria.  Musculoskeletal: Negative for joint swelling.  Skin: Negative for rash.  Neurological: Negative for headaches.  Hematological: Does not bruise/bleed easily.  Psychiatric/Behavioral: Negative for dysphoric mood. The patient is not nervous/anxious.        Objective:   Physical Exam Well-developed female in no acute distress Nose without purulence or discharge noted Chest totally clear to auscultation Cardiac exam with regular rate and rhythm Lower extremities without edema, no cyanosis Alert and oriented, moves all 4 extremities.       Assessment & Plan:

## 2011-12-03 NOTE — Patient Instructions (Signed)
Will increase advair to the 230 strength.  Use one puff in am and pm everyday.  Keep mouth rinsed Stay on singulair thru spring allergies if you feel that you are having issues.  Can give yourself a "drug holiday" thru summer if doing well.  Keep yearly followup apptm with me.

## 2012-01-21 ENCOUNTER — Other Ambulatory Visit: Payer: Self-pay | Admitting: Pulmonary Disease

## 2012-09-03 ENCOUNTER — Other Ambulatory Visit: Payer: Self-pay | Admitting: Pulmonary Disease

## 2012-09-03 MED ORDER — MONTELUKAST SODIUM 10 MG PO TABS: ORAL_TABLET | ORAL | Status: DC

## 2012-11-26 ENCOUNTER — Other Ambulatory Visit: Payer: Self-pay | Admitting: *Deleted

## 2012-11-26 ENCOUNTER — Other Ambulatory Visit: Payer: Self-pay

## 2012-11-26 ENCOUNTER — Encounter: Payer: Self-pay | Admitting: *Deleted

## 2012-11-26 DIAGNOSIS — E785 Hyperlipidemia, unspecified: Secondary | ICD-10-CM

## 2012-11-26 DIAGNOSIS — I10 Essential (primary) hypertension: Secondary | ICD-10-CM

## 2012-11-26 DIAGNOSIS — IMO0001 Reserved for inherently not codable concepts without codable children: Secondary | ICD-10-CM

## 2012-11-27 LAB — LIPID PANEL
Chol/HDL Ratio: 3.2 ratio units (ref 0.0–4.4)
Cholesterol, Total: 184 mg/dL (ref 100–199)
HDL: 58 mg/dL (ref >39)
LDL Calculated: 103 mg/dL — ABNORMAL HIGH (ref 0–99)
Triglycerides: 117 mg/dL (ref 0–149)
VLDL Cholesterol Cal: 23 mg/dL (ref 5–40)

## 2012-11-27 LAB — HEMOGLOBIN A1C
Est. average glucose Bld gHb Est-mCnc: 120 mg/dL
Hgb A1c MFr Bld: 5.8 % — ABNORMAL HIGH (ref 4.8–5.6)

## 2012-11-27 LAB — VITAMIN D 25 HYDROXY (VIT D DEFICIENCY, FRACTURES): Vit D, 25-Hydroxy: 20.8 ng/mL — ABNORMAL LOW (ref 30.0–100.0)

## 2012-11-30 ENCOUNTER — Encounter: Payer: Self-pay | Admitting: Internal Medicine

## 2012-11-30 ENCOUNTER — Ambulatory Visit (INDEPENDENT_AMBULATORY_CARE_PROVIDER_SITE_OTHER): Payer: Medicare Other | Admitting: Internal Medicine

## 2012-11-30 VITALS — BP 140/82 | HR 72 | Temp 98.0°F | Resp 16 | Wt 130.8 lb

## 2012-11-30 DIAGNOSIS — I10 Essential (primary) hypertension: Secondary | ICD-10-CM

## 2012-11-30 DIAGNOSIS — G3184 Mild cognitive impairment, so stated: Secondary | ICD-10-CM

## 2012-11-30 DIAGNOSIS — R7309 Other abnormal glucose: Secondary | ICD-10-CM

## 2012-11-30 DIAGNOSIS — F3289 Other specified depressive episodes: Secondary | ICD-10-CM

## 2012-11-30 DIAGNOSIS — E785 Hyperlipidemia, unspecified: Secondary | ICD-10-CM

## 2012-11-30 DIAGNOSIS — F32A Depression, unspecified: Secondary | ICD-10-CM | POA: Insufficient documentation

## 2012-11-30 DIAGNOSIS — R413 Other amnesia: Secondary | ICD-10-CM

## 2012-11-30 DIAGNOSIS — F329 Major depressive disorder, single episode, unspecified: Secondary | ICD-10-CM

## 2012-11-30 DIAGNOSIS — E559 Vitamin D deficiency, unspecified: Secondary | ICD-10-CM

## 2012-11-30 DIAGNOSIS — Z23 Encounter for immunization: Secondary | ICD-10-CM

## 2012-11-30 DIAGNOSIS — J309 Allergic rhinitis, unspecified: Secondary | ICD-10-CM

## 2012-11-30 DIAGNOSIS — J45909 Unspecified asthma, uncomplicated: Secondary | ICD-10-CM

## 2012-11-30 DIAGNOSIS — R739 Hyperglycemia, unspecified: Secondary | ICD-10-CM

## 2012-11-30 MED ORDER — ZOSTER VACCINE LIVE 19400 UNT/0.65ML ~~LOC~~ SOLR: 0.6500 mL | Freq: Once | SUBCUTANEOUS | Status: DC

## 2012-11-30 MED ORDER — VITAMIN D3 50 MCG (2000 UT) PO CAPS: 2000.0000 [IU] | ORAL_CAPSULE | Freq: Every day | ORAL | Status: DC

## 2012-11-30 NOTE — Progress Notes (Signed)
Patient ID: Melinda Griffin, female   DOB: [birth year], 72 y.o.   MRN: [id number]   Allergies  Allergen Reactions  . Aspirin   . Ceftin   . Ciprofloxacin Hcl     Dizzy, diarrhea    Chief Complaint  Patient presents with  . Medical Managment of Chronic Issues    HPI: Patient is a 72 y.o. Caucasian female seen in the office today for f/u on her cognitive impairment, anxiety, hypertension and hyperlipidemia.  Was seen at [facility] Dr. Glenis Griffin she was doing well--she brought the paperwork with her today.    Has been trying not to use her anxiety medicine (benzo) b/c it can affect her memory as I have been telling her for a few years--has now stopped it.    Review of Systems:  Review of Systems  Constitutional: Negative for fever, chills, weight loss and malaise/fatigue.  HENT: Negative for congestion.        No recent migraines.  Rarely even minor headaches  Eyes: Negative for blurred vision.  Respiratory: Negative for shortness of breath.   Cardiovascular: Negative for chest pain.  Gastrointestinal: Negative for heartburn, nausea, vomiting, diarrhea and constipation.  Genitourinary: Negative for dysuria, urgency and frequency.  Musculoskeletal: Negative for myalgias and falls.  Skin: Negative for rash.  Neurological: Negative for dizziness and headaches.  Psychiatric/Behavioral: Positive for memory loss. Negative for depression. The patient is nervous/anxious. The patient does not have insomnia.      Past Medical History  Diagnosis Date  . Osteoarthritis   . Hyperlipidemia   . Hypertension   . Asthma   . Anxiety   . Migraine with aura, without mention of intractable migraine without mention of status migrainosus   . Diabetes mellitus without complication    Past Surgical History  Procedure Laterality Date  . Appendectomy  1975  . Tubal ligation Bilateral 1976  . Breast biopsy  1976   Social History:   reports that she has never smoked. She does not have any  smokeless tobacco history on file. She reports that she does not drink alcohol. Her drug history is not on file.  Family History  Problem Relation Age of Onset  . Asthma Father   . Asthma Sister   . Heart disease Sister   . Cancer Mother     brain, breast    Medications: Patient's Medications  New Prescriptions   No medications on file  Previous Medications   ALBUTEROL (VENTOLIN HFA) 108 (90 BASE) MCG/ACT INHALER    Inhale 2 puffs into the lungs every 4 (four) hours as needed.     CALCIUM CARB-CHOLECALCIFEROL (CALCIUM 500 +D) 500-400 MG-UNIT TABS    Take 1 tablet by mouth daily.     CETIRIZINE (ZYRTEC) 10 MG TABLET    Take one tablet once daily for allergies   ESCITALOPRAM (LEXAPRO) 20 MG TABLET    Take 20 mg by mouth daily.     FLUTICASONE (FLONASE) 50 MCG/ACT NASAL SPRAY    Place 1 spray into the nose daily.     FLUTICASONE-SALMETEROL (ADVAIR HFA) 230-21 MCG/ACT INHALER    Inhale 1 puff into the lungs 2 (two) times daily.   LOVASTATIN (MEVACOR) 10 MG TABLET    Take 10 mg by mouth daily.    MONTELUKAST (SINGULAIR) 10 MG TABLET    TAKE 1 TABLET BY MOUTH EVERY DAY  Modified Medications   No medications on file  Discontinued Medications   CHOLECALCIFEROL (VITAMIN D3) 50000 UNITS CAPS    Take  1 capsule by mouth every 30 (thirty) days.     CLEMASTINE (TAVIST) 2.68 MG TABS    Take 2.68 mg by mouth daily.   PROBIOTIC CAPS    Take 1 capsule by mouth daily.       Physical Exam:  Filed Vitals:   11/30/12 1346  BP: 140/82  Pulse: 72  Temp: 98 F (36.7 C)  TempSrc: Oral  Resp: 16  Weight: 130 lb 12.8 oz (59.33 kg)  Physical Exam  Constitutional: She is oriented to person, place, and time. She appears well-developed and well-nourished. No distress.  HENT:  Head: Normocephalic and atraumatic.  Nose: Nose normal.  Mouth/Throat: Oropharynx is clear and moist.  Eyes: Conjunctivae and EOM are normal. Pupils are equal, round, and reactive to light.  Neck: Normal range of motion.   Cardiovascular: Normal rate, regular rhythm, normal heart sounds and intact distal pulses.   Pulmonary/Chest: Effort normal and breath sounds normal. She has no wheezes.  singulair helpful during fall time when allergies bother her  Abdominal: Soft. Bowel sounds are normal.  Musculoskeletal: Normal range of motion. She exhibits no edema and no tenderness.  Neurological: She is alert and oriented to person, place, and time. She has normal reflexes.  Skin: Skin is warm and dry.  Psychiatric: She has a normal mood and affect.  A bit anxious, talkative, pleasant    Labs reviewed: Lipid Panel:  Recent Labs  11/26/12 0859  HDL 58  LDLCALC 103*  TRIG 117  CHOLHDL 3.2  01/18/2011  CMP: glucose 90, BUN 13, Creatinine 0.70 A1C: 5.7 Lipid: Cholesterol 220, Triglycerides 209, HDL 58, LDL 120 06/07/2011 CBC: WBC 6.9, RBC 4.52, Hemoglobin 14.0 CMP: Glucose 94, BUN 13, Creatinine 0.72 Lipid: Cholesterol 200, Triglycerides 207, HDL 58, VLDL 41, LDL 101 TSH: 1.910 Vitamin D: 24.9 Vitamin B12: 367 06/04/2011 CBC: normal  CMP: glucose 94, BUN 13, Creatinine 0.72 Lipid: cholesterol 200, triglycerides 207, HDL 58, LDL 101 TSH 1.910 Vitamin D 24.9 Vitamin B12  367  Past Procedures: 1976-Breast Biopsy 1995-Flex Sig 1996-Endocervical biopsy 2006-Colonoscopy-Dr.Buccini 2008-MRI-Shoulder Pain-Dr.Ronald Griffin 2008-Cardiovascular on legs and veins 2008-[facility] 08/07/2009 Mammogram negative 08/07/2009 Bone Density low bone mass-moderate fracture risk  Assessment/Plan HYPERLIPIDEMIA Last lipids very close to goal with lovastatin.  Is hesitant to increase medication.  Cont diet and exercise, as well.    HYPERTENSION BP at goal.  Not on meds.  ALLERGIC RHINITIS Well controlled with use of singulair.  No current complaints despite severe allergy indices recently.  ASTHMA No recent exacerbations.  Doing well with singulair.  Unspecified vitamin D deficiency Levels remain  low.  Advised to take 2000 units daily vitamin D in addition to her calcium with vitamin D she already takes.  Will f/u levels before next visit.  Depression Mood is good with lexapro.  Denies any difficulties with anxiety lately either.    Mild cognitive impairment with memory loss Following annually now with Dr. Laurell Josephs at [facility], as well.  Has improved dramatically since she stopped her benzodiazepine as I recommended.  Hyperglycemia F/u hba1c and cmp before next visit.  On diet and exercise only   Labs/tests ordered:  Cbc, cmp, vitamin D, hba1c before visit in 6 mos.

## 2012-11-30 NOTE — Assessment & Plan Note (Signed)
BP at goal.  Not on meds.

## 2012-11-30 NOTE — Assessment & Plan Note (Signed)
No recent exacerbations.  Doing well with singulair.

## 2012-11-30 NOTE — Assessment & Plan Note (Signed)
Well controlled with use of singulair.  No current complaints despite severe allergy indices recently.

## 2012-11-30 NOTE — Assessment & Plan Note (Signed)
F/u hba1c and cmp before next visit.  On diet and exercise only

## 2012-11-30 NOTE — Assessment & Plan Note (Signed)
Following annually now with Dr. Laurell Josephs at [facility], as well.  Has improved dramatically since she stopped her benzodiazepine as I recommended.

## 2012-11-30 NOTE — Assessment & Plan Note (Signed)
Levels remain low.  Advised to take 2000 units daily vitamin D in addition to her calcium with vitamin D she already takes.  Will f/u levels before next visit.

## 2012-11-30 NOTE — Assessment & Plan Note (Signed)
Last lipids very close to goal with lovastatin.  Is hesitant to increase medication.  Cont diet and exercise, as well.

## 2012-11-30 NOTE — Assessment & Plan Note (Signed)
Mood is good with lexapro.  Denies any difficulties with anxiety lately either.

## 2012-12-23 ENCOUNTER — Ambulatory Visit: Payer: Medicare Other | Admitting: Pulmonary Disease

## 2013-01-01 ENCOUNTER — Ambulatory Visit: Payer: Medicare Other | Admitting: Pulmonary Disease

## 2013-01-11 ENCOUNTER — Ambulatory Visit (INDEPENDENT_AMBULATORY_CARE_PROVIDER_SITE_OTHER): Payer: Medicare Other | Admitting: Pulmonary Disease

## 2013-01-11 ENCOUNTER — Encounter: Payer: Self-pay | Admitting: Pulmonary Disease

## 2013-01-11 VITALS — BP 108/50 | HR 72 | Temp 98.1°F | Ht 61.0 in | Wt 133.0 lb

## 2013-01-11 DIAGNOSIS — J45909 Unspecified asthma, uncomplicated: Secondary | ICD-10-CM

## 2013-01-11 NOTE — Progress Notes (Signed)
  Subjective:    Patient ID: Melinda Griffin, female    DOB: [birth year], 72 y.o.   MRN: [id number]  HPI Patient comes in today for followup of her known asthma.  She has maintained on Advair HFA, and has done very well with this.  She is taking only one puff twice a day, and has also been on Singulair and Zyrtec during the allergy months.  She has had no recent acute exacerbation, and has not required her rescue inhaler.  She has been traveling quite a bit with new breathing issues.   Review of Systems  Constitutional: Negative for fever and unexpected weight change.  HENT: Negative for ear pain, nosebleeds, congestion, sore throat, rhinorrhea, sneezing, trouble swallowing, dental problem, postnasal drip and sinus pressure.   Eyes: Negative for redness and itching.  Respiratory: Negative for cough, chest tightness, shortness of breath and wheezing.   Cardiovascular: Negative for palpitations and leg swelling.  Gastrointestinal: Negative for nausea and vomiting.  Genitourinary: Negative for dysuria.  Musculoskeletal: Negative for joint swelling.  Skin: Negative for rash.  Neurological: Negative for headaches.  Hematological: Does not bruise/bleed easily.  Psychiatric/Behavioral: Negative for dysphoric mood. The patient is not nervous/anxious.        Objective:   Physical Exam Well-developed female in no acute distress Nose without purulence or discharge noted Chest with good breath sounds bilaterally, no wheezing Cardiac exam with regular rate and rhythm Lower extremities with no edema, no cyanosis Alert and oriented, moves all 4 extremities.       Assessment & Plan:

## 2013-01-11 NOTE — Assessment & Plan Note (Signed)
The patient is doing very well from an asthma standpoint, has had no recent acute exacerbation or increase rescue inhaler use.  I have asked her to continue on her current bronchodilator regimen, as well as her allergy medications during the seasons.  If doing well, she will followup with me in one year.

## 2013-01-11 NOTE — Patient Instructions (Addendum)
Continue on advair, but can increase to 2 puffs twice a day if having a bad day or if an early flareup. Continue on allergy regimen followup with me in one year.

## 2013-02-11 ENCOUNTER — Other Ambulatory Visit: Payer: Self-pay | Admitting: Internal Medicine

## 2013-02-12 ENCOUNTER — Other Ambulatory Visit: Payer: Self-pay | Admitting: Pulmonary Disease

## 2013-02-25 ENCOUNTER — Other Ambulatory Visit: Payer: Self-pay

## 2013-02-25 ENCOUNTER — Encounter: Payer: Self-pay | Admitting: Internal Medicine

## 2013-02-25 DIAGNOSIS — Z1231 Encounter for screening mammogram for malignant neoplasm of breast: Secondary | ICD-10-CM

## 2013-03-19 ENCOUNTER — Ambulatory Visit
Admission: RE | Admit: 2013-03-19 | Discharge: 2013-03-19 | Disposition: A | Discharge status: Home / Self Care | Payer: Medicare Other | Source: Ambulatory Visit

## 2013-03-19 DIAGNOSIS — Z1231 Encounter for screening mammogram for malignant neoplasm of breast: Secondary | ICD-10-CM

## 2013-04-07 ENCOUNTER — Other Ambulatory Visit: Payer: Self-pay | Admitting: Internal Medicine

## 2013-04-07 ENCOUNTER — Other Ambulatory Visit: Payer: Self-pay | Admitting: Pulmonary Disease

## 2013-05-20 ENCOUNTER — Other Ambulatory Visit: Payer: Medicare Other

## 2013-05-21 ENCOUNTER — Other Ambulatory Visit: Payer: Medicare Other

## 2013-05-21 DIAGNOSIS — E559 Vitamin D deficiency, unspecified: Secondary | ICD-10-CM

## 2013-05-21 DIAGNOSIS — R739 Hyperglycemia, unspecified: Secondary | ICD-10-CM

## 2013-05-21 DIAGNOSIS — I10 Essential (primary) hypertension: Secondary | ICD-10-CM

## 2013-05-22 LAB — CBC WITH DIFFERENTIAL/PLATELET
Basophils Absolute: 0 10*3/uL (ref 0.0–0.2)
Basos: 1 %
Eos: 9 %
Eosinophils Absolute: 0.6 10*3/uL — ABNORMAL HIGH (ref 0.0–0.4)
HCT: 40.2 % (ref 34.0–46.6)
Hemoglobin: 13.6 g/dL (ref 11.1–15.9)
Immature Grans (Abs): 0 10*3/uL (ref 0.0–0.1)
Immature Granulocytes: 0 %
Lymphocytes Absolute: 2.4 10*3/uL (ref 0.7–3.1)
Lymphs: 36 %
MCH: 30.8 pg (ref 26.6–33.0)
MCHC: 33.8 g/dL (ref 31.5–35.7)
MCV: 91 fL (ref 79–97)
Monocytes Absolute: 0.6 10*3/uL (ref 0.1–0.9)
Monocytes: 10 %
Neutrophils Absolute: 2.8 10*3/uL (ref 1.4–7.0)
Neutrophils Relative %: 44 %
RBC: 4.41 x10E6/uL (ref 3.77–5.28)
RDW: 13.3 % (ref 12.3–15.4)
WBC: 6.5 10*3/uL (ref 3.4–10.8)

## 2013-05-22 LAB — COMPREHENSIVE METABOLIC PANEL
ALT: 16 IU/L (ref 0–32)
AST: 21 IU/L (ref 0–40)
Albumin/Globulin Ratio: 1.7 (ref 1.1–2.5)
Albumin: 4.4 g/dL (ref 3.5–4.8)
Alkaline Phosphatase: 70 IU/L (ref 39–117)
BUN/Creatinine Ratio: 23 (ref 11–26)
BUN: 15 mg/dL (ref 8–27)
CO2: 27 mmol/L (ref 18–29)
Calcium: 9.7 mg/dL (ref 8.6–10.2)
Chloride: 103 mmol/L (ref 97–108)
Creatinine, Ser: 0.64 mg/dL (ref 0.57–1.00)
GFR calc Af Amer: 102 mL/min/{1.73_m2} (ref >59)
GFR calc non Af Amer: 88 mL/min/{1.73_m2} (ref >59)
Globulin, Total: 2.6 g/dL (ref 1.5–4.5)
Glucose: 82 mg/dL (ref 65–99)
Potassium: 4.4 mmol/L (ref 3.5–5.2)
Sodium: 145 mmol/L — ABNORMAL HIGH (ref 134–144)
Total Bilirubin: 0.3 mg/dL (ref 0.0–1.2)
Total Protein: 7 g/dL (ref 6.0–8.5)

## 2013-05-22 LAB — HEMOGLOBIN A1C
Est. average glucose Bld gHb Est-mCnc: 128 mg/dL
Hgb A1c MFr Bld: 6.1 % — ABNORMAL HIGH (ref 4.8–5.6)

## 2013-05-22 LAB — VITAMIN D 25 HYDROXY (VIT D DEFICIENCY, FRACTURES): Vit D, 25-Hydroxy: 31.9 ng/mL (ref 30.0–100.0)

## 2013-05-23 ENCOUNTER — Other Ambulatory Visit: Payer: Self-pay | Admitting: Internal Medicine

## 2013-05-24 ENCOUNTER — Ambulatory Visit: Payer: Medicare Other | Admitting: Internal Medicine

## 2013-05-26 ENCOUNTER — Other Ambulatory Visit: Payer: Self-pay | Admitting: Internal Medicine

## 2013-05-31 ENCOUNTER — Ambulatory Visit (INDEPENDENT_AMBULATORY_CARE_PROVIDER_SITE_OTHER): Payer: Medicare Other | Admitting: Internal Medicine

## 2013-05-31 ENCOUNTER — Encounter: Payer: Self-pay | Admitting: Internal Medicine

## 2013-05-31 VITALS — BP 120/68 | HR 70 | Temp 97.9°F | Wt 131.0 lb

## 2013-05-31 DIAGNOSIS — G3184 Mild cognitive impairment, so stated: Secondary | ICD-10-CM

## 2013-05-31 DIAGNOSIS — E559 Vitamin D deficiency, unspecified: Secondary | ICD-10-CM

## 2013-05-31 DIAGNOSIS — I10 Essential (primary) hypertension: Secondary | ICD-10-CM

## 2013-05-31 DIAGNOSIS — R739 Hyperglycemia, unspecified: Secondary | ICD-10-CM

## 2013-05-31 DIAGNOSIS — M19049 Primary osteoarthritis, unspecified hand: Secondary | ICD-10-CM

## 2013-05-31 DIAGNOSIS — R413 Other amnesia: Secondary | ICD-10-CM

## 2013-05-31 DIAGNOSIS — R7309 Other abnormal glucose: Secondary | ICD-10-CM

## 2013-05-31 DIAGNOSIS — Z23 Encounter for immunization: Secondary | ICD-10-CM

## 2013-05-31 DIAGNOSIS — M180 Bilateral primary osteoarthritis of first carpometacarpal joints: Secondary | ICD-10-CM

## 2013-05-31 DIAGNOSIS — E785 Hyperlipidemia, unspecified: Secondary | ICD-10-CM

## 2013-05-31 MED ORDER — TETANUS-DIPHTHERIA TOXOIDS TD 2-2 LF/0.5ML IM SUSP: 0.5000 mL | Freq: Once | INTRAMUSCULAR | Status: DC

## 2013-05-31 NOTE — Progress Notes (Signed)
Patient ID: Melinda Griffin, female   DOB: 11-05-1940, 72 y.o.   MRN: [id number] Location:  [facility] / [facility]   Allergies  Allergen Reactions  . Aspirin   . Ceftin   . Ciprofloxacin Hcl     Dizzy, diarrhea    Chief Complaint  Patient presents with  . Medical Managment of Chronic Issues    6 month follow-up, discuss labs (copy printed)   . Hand Problem    bilateral pain in thumbs off/on (right is worse) x 3-4 weeks, patient recalls pain onset when doing yard work (lifting heavy objects)     HPI: Patient is a 72 y.o. white female seen in the office today for f/u of chronic medical conditions and now increased pain in her thumbs bilaterally.  Not when gets up in morning or goes to be at night.  If uses thumb for a prolonged time like holding a magazine open or uses thumb to hold something together.  Started after doing yardwork with husband.    Now going to [place] next week.    Stopped her calcium chewables.  Has quit her 1 coffee and 2 diet sodas per day--now just having the coffee and one soda and zinger tea.    Has had three trips to [place] since she was here and tracing her genetics.  Has had more sweets and starchy food so hba1c slightly higher.  Not having anxiety right now.    Says she will just follow here about memory.    Review of Systems:  Review of Systems  Constitutional: Negative for fever and chills.  HENT: Negative for hearing loss.   Eyes: Negative for blurred vision.  Respiratory: Negative for shortness of breath.   Cardiovascular: Negative for chest pain.  Gastrointestinal: Negative for abdominal pain and constipation.  Genitourinary: Negative for dysuria.  Musculoskeletal: Positive for joint pain. Negative for myalgias and falls.       Bilateral thumb pain  Skin: Negative for rash.  Neurological: Negative for dizziness and headaches.  Psychiatric/Behavioral: Positive for memory loss. Negative for depression. The  patient is not nervous/anxious.     Past Medical History  Diagnosis Date  . Osteoarthritis   . Hyperlipidemia   . Hypertension   . Asthma   . Anxiety   . Migraine with aura, without mention of intractable migraine without mention of status migrainosus   . Diabetes mellitus without complication     Past Surgical History  Procedure Laterality Date  . Appendectomy  1975  . Tubal ligation Bilateral 1976  . Breast biopsy  1976    Social History:   reports that she has never smoked. She does not have any smokeless tobacco history on file. She reports that she does not drink alcohol or use illicit drugs.  Family History  Problem Relation Age of Onset  . Asthma Father   . Asthma Sister   . Heart disease Sister   . Cancer Mother     brain, breast    Medications: Patient's Medications  New Prescriptions   No medications on file  Previous Medications   ADVAIR HFA 230-21 MCG/ACT INHALER    INHALE 1 PUFF INTO THE LUNGS TWICE DAILY   CETIRIZINE (ZYRTEC) 10 MG TABLET    Take one tablet once daily for allergies   CHOLECALCIFEROL (VITAMIN D3) 2000 UNITS CAPSULE    Take 1 capsule (2,000 Units total) by mouth daily.   CITALOPRAM (CELEXA) 40 MG TABLET    TAKE 1  TABLET BY MOUTH ONCE DAILY   DIPTHERIA-TETANUS TOXOIDS (DECAVAC) 2-2 LF/0.5ML INJECTION    Inject 0.5 mLs into the muscle once.   ESCITALOPRAM (LEXAPRO) 20 MG TABLET    Take 20 mg by mouth daily.     FLUTICASONE (FLONASE) 50 MCG/ACT NASAL SPRAY    Place 1 spray into the nose daily.     LOVASTATIN (MEVACOR) 40 MG TABLET    TAKE 1 TABLET EVERY DAY TO LOWER CHLOESTEROL   MONTELUKAST (SINGULAIR) 10 MG TABLET    TAKE 1 TABLET BY MOUTH EVERY DAY   MULTIPLE VITAMINS-MINERALS (MULTI FOR HER 50+) CAPS    Take by mouth daily.   PROAIR HFA 108 (90 BASE) MCG/ACT INHALER    USE 2 PUFFS UP TO 4 TIMES A DAY TO HELP BREATHING  Modified Medications   No medications on file  Discontinued Medications   CALCIUM CARB-CHOLECALCIFEROL (CALCIUM 500  +D) 500-400 MG-UNIT TABS    Take 1 tablet by mouth daily.     LOVASTATIN (MEVACOR) 10 MG TABLET    Take 10 mg by mouth daily.    ZOSTER VACCINE LIVE, PF, (ZOSTAVAX) 16109 UNT/0.65ML INJECTION    Inject 19,400 Units into the skin once.     Physical Exam: Filed Vitals:   05/31/13 1025  BP: 120/68  Pulse: 70  Temp: 97.9 F (36.6 C)  TempSrc: Oral  Weight: 131 lb (59.421 kg)  SpO2: 98%  Physical Exam  Constitutional: She is oriented to person, place, and time. She appears well-developed and well-nourished. No distress.  HENT:  Head: Normocephalic and atraumatic.  Eyes: EOM are normal. Pupils are equal, round, and reactive to light.  Cardiovascular: Normal rate, regular rhythm, normal heart sounds and intact distal pulses.   Pulmonary/Chest: Effort normal and breath sounds normal. No respiratory distress.  Abdominal: Soft. Bowel sounds are normal. She exhibits no distension. There is no tenderness.  Musculoskeletal: Normal range of motion. She exhibits no edema and no tenderness.  Joint deformities bilateral first digits  Neurological: She is alert and oriented to person, place, and time.  Skin: Skin is warm and dry.  Psychiatric:  Anxious and talkative      Labs reviewed: Basic Metabolic Panel:  Recent Labs  60/45/40 0925  NA 145*  K 4.4  CL 103  CO2 27  GLUCOSE 82  BUN 15  CREATININE 0.64  CALCIUM 9.7   Liver Function Tests:  Recent Labs  05/21/13 0925  AST 21  ALT 16  ALKPHOS 70  BILITOT 0.3  PROT 7.0  CBC:  Recent Labs  05/21/13 0925  WBC 6.5  NEUTROABS 2.8  HGB 13.6  HCT 40.2  MCV 91   Lipid Panel:  Recent Labs  11/26/12 0859  HDL 58  LDLCALC 103*  TRIG 117  CHOLHDL 3.2   Lab Results  Component Value Date   HGBA1C 6.1* 05/21/2013   Assessment/Plan 1. Hyperglycemia -sugar average up slightly, advised to watch her sweets and starches a little bit more b/c of this -is going to [place] so I doubt these will improve by next visit, but she  says she will work on it  2. Essential hypertension, benign -bp at goal  3. Mild cognitive impairment with memory loss -follows with [facility] also, but we will recheck mmse next visit--has been much better since getting off her tramadol and benzos  4. Unspecified vitamin D deficiency -continue vitamin D, doing ok without ca w/o D  5. Hyperlipidemia LDL goal < 100 -lipids are satisfactory--right  around goal with good HDL  Labs/tests ordered:  UXL2G, cbc, cmp, flp Next appt:  6 mos

## 2013-06-06 ENCOUNTER — Other Ambulatory Visit: Payer: Self-pay | Admitting: Internal Medicine

## 2013-06-06 DIAGNOSIS — J302 Other seasonal allergic rhinitis: Secondary | ICD-10-CM

## 2013-06-14 ENCOUNTER — Other Ambulatory Visit: Payer: Self-pay | Admitting: *Deleted

## 2013-06-14 MED ORDER — TETANUS-DIPHTHERIA TOXOIDS TD 5-2 LFU IM INJ: 0.5000 mL | INJECTION | Freq: Once | INTRAMUSCULAR | Status: DC

## 2013-06-14 NOTE — Telephone Encounter (Signed)
rx for the Tenivac sent e-fax to pharmacy.

## 2013-08-06 ENCOUNTER — Other Ambulatory Visit: Payer: Self-pay | Admitting: Internal Medicine

## 2013-09-21 ENCOUNTER — Other Ambulatory Visit: Payer: Self-pay | Admitting: Internal Medicine

## 2013-10-31 ENCOUNTER — Other Ambulatory Visit: Payer: Self-pay | Admitting: Internal Medicine

## 2013-11-25 ENCOUNTER — Other Ambulatory Visit: Payer: Medicare Other

## 2013-11-25 DIAGNOSIS — I10 Essential (primary) hypertension: Secondary | ICD-10-CM

## 2013-11-25 DIAGNOSIS — E785 Hyperlipidemia, unspecified: Secondary | ICD-10-CM

## 2013-11-25 DIAGNOSIS — R739 Hyperglycemia, unspecified: Secondary | ICD-10-CM

## 2013-11-25 DIAGNOSIS — E559 Vitamin D deficiency, unspecified: Secondary | ICD-10-CM

## 2013-11-26 LAB — COMPREHENSIVE METABOLIC PANEL
ALT: 17 IU/L (ref 0–32)
AST: 20 IU/L (ref 0–40)
Albumin/Globulin Ratio: 1.7 (ref 1.1–2.5)
Albumin: 4.3 g/dL (ref 3.5–4.8)
Alkaline Phosphatase: 73 IU/L (ref 39–117)
BUN/Creatinine Ratio: 24 (ref 11–26)
BUN: 16 mg/dL (ref 8–27)
CO2: 25 mmol/L (ref 18–29)
Calcium: 9.6 mg/dL (ref 8.7–10.3)
Chloride: 102 mmol/L (ref 97–108)
Creatinine, Ser: 0.67 mg/dL (ref 0.57–1.00)
GFR calc Af Amer: 100 mL/min/{1.73_m2} (ref >59)
GFR calc non Af Amer: 87 mL/min/{1.73_m2} (ref >59)
Globulin, Total: 2.5 g/dL (ref 1.5–4.5)
Glucose: 89 mg/dL (ref 65–99)
Potassium: 4.1 mmol/L (ref 3.5–5.2)
Sodium: 143 mmol/L (ref 134–144)
Total Bilirubin: 0.3 mg/dL (ref 0.0–1.2)
Total Protein: 6.8 g/dL (ref 6.0–8.5)

## 2013-11-26 LAB — CBC WITH DIFFERENTIAL/PLATELET
Basophils Absolute: 0.1 10*3/uL (ref 0.0–0.2)
Basos: 1 %
Eos: 7 %
Eosinophils Absolute: 0.5 10*3/uL — ABNORMAL HIGH (ref 0.0–0.4)
HCT: 40.3 % (ref 34.0–46.6)
Hemoglobin: 13.9 g/dL (ref 11.1–15.9)
Immature Grans (Abs): 0 10*3/uL (ref 0.0–0.1)
Immature Granulocytes: 0 %
Lymphocytes Absolute: 2.6 10*3/uL (ref 0.7–3.1)
Lymphs: 35 %
MCH: 31 pg (ref 26.6–33.0)
MCHC: 34.5 g/dL (ref 31.5–35.7)
MCV: 90 fL (ref 79–97)
Monocytes Absolute: 0.6 10*3/uL (ref 0.1–0.9)
Monocytes: 8 %
Neutrophils Absolute: 3.5 10*3/uL (ref 1.4–7.0)
Neutrophils Relative %: 49 %
RBC: 4.48 x10E6/uL (ref 3.77–5.28)
RDW: 13.9 % (ref 12.3–15.4)
WBC: 7.3 10*3/uL (ref 3.4–10.8)

## 2013-11-26 LAB — LIPID PANEL
Chol/HDL Ratio: 2.9 ratio units (ref 0.0–4.4)
Cholesterol, Total: 179 mg/dL (ref 100–199)
HDL: 62 mg/dL (ref >39)
LDL Calculated: 95 mg/dL (ref 0–99)
Triglycerides: 111 mg/dL (ref 0–149)
VLDL Cholesterol Cal: 22 mg/dL (ref 5–40)

## 2013-11-26 LAB — HEMOGLOBIN A1C
Est. average glucose Bld gHb Est-mCnc: 126 mg/dL
Hgb A1c MFr Bld: 6 % — ABNORMAL HIGH (ref 4.8–5.6)

## 2013-11-29 ENCOUNTER — Encounter: Payer: Self-pay | Admitting: Internal Medicine

## 2013-11-29 ENCOUNTER — Ambulatory Visit (INDEPENDENT_AMBULATORY_CARE_PROVIDER_SITE_OTHER): Payer: Medicare Other | Admitting: Internal Medicine

## 2013-11-29 VITALS — BP 122/78 | HR 87 | Temp 97.1°F | Resp 20 | Ht 61.0 in | Wt 133.8 lb

## 2013-11-29 DIAGNOSIS — F411 Generalized anxiety disorder: Secondary | ICD-10-CM

## 2013-11-29 DIAGNOSIS — R7309 Other abnormal glucose: Secondary | ICD-10-CM

## 2013-11-29 DIAGNOSIS — G3184 Mild cognitive impairment, so stated: Secondary | ICD-10-CM

## 2013-11-29 DIAGNOSIS — J45909 Unspecified asthma, uncomplicated: Secondary | ICD-10-CM

## 2013-11-29 DIAGNOSIS — F419 Anxiety disorder, unspecified: Secondary | ICD-10-CM | POA: Insufficient documentation

## 2013-11-29 DIAGNOSIS — E785 Hyperlipidemia, unspecified: Secondary | ICD-10-CM | POA: Insufficient documentation

## 2013-11-29 DIAGNOSIS — Z23 Encounter for immunization: Secondary | ICD-10-CM

## 2013-11-29 DIAGNOSIS — R413 Other amnesia: Secondary | ICD-10-CM

## 2013-11-29 DIAGNOSIS — J309 Allergic rhinitis, unspecified: Secondary | ICD-10-CM

## 2013-11-29 DIAGNOSIS — R739 Hyperglycemia, unspecified: Secondary | ICD-10-CM

## 2013-11-29 NOTE — Progress Notes (Signed)
Patient ID: Melinda Griffin, female   DOB: [birth year], 73 y.o.   MRN: [id number]   Location:  [facility]  Code Status: I think she has an advance directive in her previous medical record in misys--I have asked the MAs to look for this and get it scanned into epic  Allergies  Allergen Reactions  . Aspirin   . Ceftin   . Ciprofloxacin Hcl     Dizzy, diarrhea    Chief Complaint  Patient presents with  . Follow-up    discuss labs    HPI: Patient is a 73 y.o. white female seen in the office today for f/u of her chronic medical conditions.   Feels like she has one of the best years of her life in terms of how she feels.  Family connections have been good.  Meds working well.    Reviewed her mychart with her--asked about her benign essential hypertension--bp used to be high, and now better.    Her cognition is stable--it is better since her anxiety is improved.    Had anxiety that was bad a few years back, and this is now improved--was treated with celexa for depression at that time and remains on it to help with anxiety primarily.    Asthma has been good.  Uses singulair and advair.    Also has zyrtec and singulair she uses during allergy season.  Has not had exacerbation since 2012.    She is not diabetic, but hyperglycemic.  She has normal pulses on exam and no s/s of peripheral arterial disease.  She remains active.  She is at much below average fall risk.  She does not use assistive devices.  She is up to date on colonoscopy--not due until 2024.  Review of Systems:  Review of Systems  Constitutional: Negative for fever and malaise/fatigue.  HENT: Negative for hearing loss.   Eyes: Negative for blurred vision.  Respiratory: Negative for cough and wheezing.   Cardiovascular: Negative for chest pain and leg swelling.  Gastrointestinal: Negative for nausea, vomiting, constipation, blood in stool and melena.  Genitourinary: Negative for  dysuria, urgency and frequency.  Musculoskeletal: Negative for falls and myalgias.  Skin: Negative for rash.  Neurological: Negative for dizziness, loss of consciousness, weakness and headaches.  Endo/Heme/Allergies: Does not bruise/bleed easily.  Psychiatric/Behavioral: Negative for depression and memory loss. The patient is nervous/anxious.     Past Medical History  Diagnosis Date  . Osteoarthritis   . Hyperlipidemia   . Hypertension   . Asthma   . Anxiety   . Migraine with aura, without mention of intractable migraine without mention of status migrainosus   . Diabetes mellitus without complication     Past Surgical History  Procedure Laterality Date  . Appendectomy  1975  . Tubal ligation Bilateral 1976  . Breast biopsy  1976    Social History:   reports that she has never smoked. She does not have any smokeless tobacco history on file. She reports that she does not drink alcohol or use illicit drugs.  Family History  Problem Relation Age of Onset  . Asthma Father   . Asthma Sister   . Heart disease Sister   . Cancer Mother     brain, breast  . Cancer Brother 71  . Heart disease Brother     Medications: Patient's Medications  New Prescriptions   No medications on file  Previous Medications   ADVAIR HFA 230-21 MCG/ACT INHALER  INHALE 1 PUFF INTO THE LUNGS TWICE DAILY   CETIRIZINE (ZYRTEC) 10 MG TABLET    Take one tablet once daily for allergies   CHOLECALCIFEROL (VITAMIN D3) 2000 UNITS CAPSULE    Take 1 capsule (2,000 Units total) by mouth daily.   CITALOPRAM (CELEXA) 40 MG TABLET    TAKE 1 TABLET BY MOUTH ONCE DAILY   DIPTHERIA-TETANUS TOXOIDS (DECAVAC) 2-2 LF/0.5ML INJECTION    Inject 0.5 mLs into the muscle once.   ESCITALOPRAM (LEXAPRO) 20 MG TABLET    Take 20 mg by mouth daily.     FLUTICASONE (FLONASE) 50 MCG/ACT NASAL SPRAY    USE 1 SPRAY IN EACH NOSTRIL EVERY DAY AS NEEDED FOR ALLERGIES   LOVASTATIN (MEVACOR) 40 MG TABLET    TAKE 1 TABLET EVERY DAY TO  LOWER CHLOESTEROL   MONTELUKAST (SINGULAIR) 10 MG TABLET    TAKE 1 TABLET BY MOUTH EVERY DAY   MULTIPLE VITAMINS-MINERALS (MULTI FOR HER 50+) CAPS    Take by mouth daily.   PROAIR HFA 108 (90 BASE) MCG/ACT INHALER    INHALE 2 PUFFS UP TO 4 TIMES A DAY TO HELP BREATHING  Modified Medications   No medications on file  Discontinued Medications   TETANUS & DIPHTHERIA TOXOIDS, ADULT, (TENIVAC) 5-2 LFU INJECTION    Inject 0.5 mLs into the muscle once.     Physical Exam: Filed Vitals:   11/29/13 1013  BP: 122/78  Pulse: 87  Temp: 97.1 F (36.2 C)  TempSrc: Oral  Resp: 20  Height: 5\' 1"  (1.549 m)  Weight: 133 lb 12.8 oz (60.691 kg)  SpO2: 96%  Physical Exam  Constitutional: She is oriented to person, place, and time. She appears well-developed and well-nourished. No distress.  Cardiovascular: Normal rate, regular rhythm, normal heart sounds and intact distal pulses.   Pulmonary/Chest: Effort normal and breath sounds normal. No respiratory distress. She has no wheezes.  Abdominal: Soft. Bowel sounds are normal. She exhibits no distension and no mass. There is no tenderness.  Musculoskeletal: Normal range of motion. She exhibits no edema and no tenderness.  Neurological: She is alert and oriented to person, place, and time.  Skin: Skin is warm and dry.  Psychiatric: She has a normal mood and affect.  A little anxious, talkative, very pleasant    Labs reviewed: Basic Metabolic Panel:  Recent Labs  05/21/13 0925 11/25/13 0854  NA 145* 143  K 4.4 4.1  CL 103 102  CO2 27 25  GLUCOSE 82 89  BUN 15 16  CREATININE 0.64 0.67  CALCIUM 9.7 9.6   Liver Function Tests:  Recent Labs  05/21/13 0925 11/25/13 0854  AST 21 20  ALT 16 17  ALKPHOS 70 73  BILITOT 0.3 0.3  PROT 7.0 6.8  CBC:  Recent Labs  05/21/13 0925 11/25/13 0854  WBC 6.5 7.3  NEUTROABS 2.8 3.5  HGB 13.6 13.9  HCT 40.2 40.3  MCV 91 90   Lipid Panel:  Recent Labs  11/25/13 0854  HDL 62  LDLCALC 95    TRIG 111  CHOLHDL 2.9   Lab Results  Component Value Date   HGBA1C 6.0* 11/25/2013   Past Procedures:  Reviewed cscope and mammogram and vaccinations  Assessment/Plan 1. Mild cognitive impairment with memory loss -seems primarily due to her anxiety -will cont celexa which is helping her  2. Anxiety state, unspecified -stable with celexa, off benzos with great cognitive improvement  3. Hyperlipidemia LDL goal < 100 -is at goal with mevacor, diet  and exercise--cont walking  4. Asthma, chronic -stable with advair, albuterol and singulair--not using albuterol at all  -no exacerbations since 2012  5. Allergic rhinitis, cause unspecified -cont zyrtec and singulair during allergy season  6. Need for vaccination with 13-polyvalent pneumococcal conjugate vaccine -prevnar today Labs/tests ordered: Orders Placed This Encounter  Procedures  . CBC With differential/Platelet    Standing Status: Future     Number of Occurrences:      Standing Expiration Date: 11/30/2014  . Comprehensive metabolic panel    Standing Status: Future     Number of Occurrences:      Standing Expiration Date: 11/30/2014    Order Specific Question:  Has the patient fasted?    Answer:  Yes  . Hemoglobin A1c    Standing Status: Future     Number of Occurrences:      Standing Expiration Date: 11/30/2014  . Lipid panel    Standing Status: Future     Number of Occurrences:      Standing Expiration Date: 11/30/2014    Order Specific Question:  Has the patient fasted?    Answer:  Yes    Next appt:  6 mos labs before

## 2013-12-31 ENCOUNTER — Encounter: Payer: Self-pay | Admitting: Internal Medicine

## 2013-12-31 ENCOUNTER — Ambulatory Visit (INDEPENDENT_AMBULATORY_CARE_PROVIDER_SITE_OTHER): Payer: Medicare Other | Admitting: Internal Medicine

## 2013-12-31 VITALS — BP 120/78 | HR 87 | Resp 10 | Wt 134.0 lb

## 2013-12-31 DIAGNOSIS — Z5181 Encounter for therapeutic drug level monitoring: Secondary | ICD-10-CM | POA: Insufficient documentation

## 2013-12-31 NOTE — Progress Notes (Signed)
Patient ID: Melinda Griffin, female   DOB: [birth year], 73 y.o.   MRN: [id number]   Location:  [facility]  Code Status: need to have goals of care discussion at annual exam  Allergies  Allergen Reactions  . Aspirin   . Ceftin   . Ciprofloxacin Hcl     Dizzy, diarrhea    Chief Complaint  Patient presents with  . Results    Discuss Genetic test results     HPI: Patient is a 73 y.o. white female seen in the office today for review of her genetic testing results.  She had a swab performed at her last visit.    Review of Systems:  Review of Systems  Constitutional: Negative for fever.  HENT: Positive for congestion.   Eyes: Negative for blurred vision.  Respiratory: Negative for cough, shortness of breath and wheezing.   Cardiovascular: Negative for chest pain.  Gastrointestinal: Negative for abdominal pain and constipation.  Genitourinary: Negative for dysuria.  Musculoskeletal: Negative for falls.  Neurological: Negative for dizziness and headaches.  Psychiatric/Behavioral:       Memory stable since off meds that contributed to MCI     Past Medical History  Diagnosis Date  . Osteoarthritis   . Hyperlipidemia   . Hypertension   . Asthma   . Anxiety   . Migraine with aura, without mention of intractable migraine without mention of status migrainosus   . Diabetes mellitus without complication     Past Surgical History  Procedure Laterality Date  . Appendectomy  1975  . Tubal ligation Bilateral 1976  . Breast biopsy  1976    Social History:   reports that she has never smoked. She does not have any smokeless tobacco history on file. She reports that she does not drink alcohol or use illicit drugs.  Family History  Problem Relation Age of Onset  . Asthma Father   . Asthma Sister   . Heart disease Sister   . Cancer Mother     brain, breast  . Cancer Brother 46  . Heart disease Brother     Medications: Patient's  Medications  New Prescriptions   No medications on file  Previous Medications   ADVAIR HFA 230-21 MCG/ACT INHALER    INHALE 1 PUFF INTO THE LUNGS TWICE DAILY   CETIRIZINE (ZYRTEC) 10 MG TABLET    Take one tablet once daily for allergies   CHOLECALCIFEROL (VITAMIN D3) 2000 UNITS CAPSULE    Take 1 capsule (2,000 Units total) by mouth daily.   CITALOPRAM (CELEXA) 40 MG TABLET    TAKE 1 TABLET BY MOUTH ONCE DAILY   DIPTHERIA-TETANUS TOXOIDS (DECAVAC) 2-2 LF/0.5ML INJECTION    Inject 0.5 mLs into the muscle once.   FLUTICASONE (FLONASE) 50 MCG/ACT NASAL SPRAY    USE 1 SPRAY IN EACH NOSTRIL EVERY DAY AS NEEDED FOR ALLERGIES   LOVASTATIN (MEVACOR) 40 MG TABLET    TAKE 1 TABLET EVERY DAY TO LOWER CHLOESTEROL   MONTELUKAST (SINGULAIR) 10 MG TABLET    TAKE 1 TABLET BY MOUTH EVERY DAY   MULTIPLE VITAMINS-MINERALS (MULTI FOR HER 50+) CAPS    Take by mouth daily.   PROAIR HFA 108 (90 BASE) MCG/ACT INHALER    INHALE 2 PUFFS UP TO 4 TIMES A DAY TO HELP BREATHING  Modified Medications   No medications on file  Discontinued Medications   No medications on file     Physical Exam: Filed Vitals:   12/31/13  1108  BP: 120/78  Pulse: 87  Resp: 10  Weight: 134 lb (60.782 kg)  SpO2: 96%  Physical Exam  Constitutional: She is oriented to person, place, and time. She appears well-developed and well-nourished. No distress.  Cardiovascular: Normal rate, regular rhythm and normal heart sounds.   Pulmonary/Chest: Effort normal and breath sounds normal. She has no wheezes.  Musculoskeletal: Normal range of motion.  Ambulates independently  Neurological: She is alert and oriented to person, place, and time.  Psychiatric: She has a normal mood and affect.    Labs reviewed: Basic Metabolic Panel:  Recent Labs  05/21/13 0925 11/25/13 0854  NA 145* 143  K 4.4 4.1  CL 103 102  CO2 27 25  GLUCOSE 82 89  BUN 15 16  CREATININE 0.64 0.67  CALCIUM 9.7 9.6   Liver Function Tests:  Recent Labs   05/21/13 0925 11/25/13 0854  AST 21 20  ALT 16 17  ALKPHOS 70 73  BILITOT 0.3 0.3  PROT 7.0 6.8  CBC:  Recent Labs  05/21/13 0925 11/25/13 0854  WBC 6.5 7.3  NEUTROABS 2.8 3.5  HGB 13.6 13.9  HCT 40.2 40.3  MCV 91 90   Lipid Panel:  Recent Labs  11/25/13 0854  HDL 62  LDLCALC 95  TRIG 111  CHOLHDL 2.9   Lab Results  Component Value Date   HGBA1C 6.0* 11/25/2013   Assessment/Plan 1. Encounter for therapeutic drug monitoring -she is doing wonderfully.  Genetic tests show possible interaction of lovastatin with her symbicort, but she is doing well with these.  Celexa also may be metabolized poorly in her system, but she is doing very well with it.  She does not want to change her regimen.    Labs/tests ordered:  No additional needed today Next appt:  As scheduled for 6 mos

## 2014-01-10 ENCOUNTER — Ambulatory Visit (INDEPENDENT_AMBULATORY_CARE_PROVIDER_SITE_OTHER): Payer: Medicare Other | Admitting: Pulmonary Disease

## 2014-01-10 ENCOUNTER — Encounter: Payer: Self-pay | Admitting: Pulmonary Disease

## 2014-01-10 VITALS — BP 102/62 | HR 90 | Temp 97.6°F | Ht 62.0 in | Wt 134.4 lb

## 2014-01-10 DIAGNOSIS — J45901 Unspecified asthma with (acute) exacerbation: Secondary | ICD-10-CM

## 2014-01-10 DIAGNOSIS — J45909 Unspecified asthma, uncomplicated: Secondary | ICD-10-CM

## 2014-01-10 MED ORDER — ALBUTEROL SULFATE HFA 108 (90 BASE) MCG/ACT IN AERS: INHALATION_SPRAY | RESPIRATORY_TRACT | Status: DC

## 2014-01-10 MED ORDER — PREDNISONE 10 MG PO TABS: ORAL_TABLET | ORAL | Status: DC

## 2014-01-10 NOTE — Patient Instructions (Signed)
Will treat with an 8 day course of prednisone to get you thru this episode. Increase your advair inhaler to 2 puffs twice a day Will send in a prescription for your rescue inhaler.  followup with me again in one year if doing well, but call if you are not getting better.

## 2014-01-10 NOTE — Assessment & Plan Note (Signed)
The patient is having a flare up of her asthma, with acute bronchospasm on exam today. She will need a course of prednisone to get her through this episode, and I've asked her to increase her Advair to 2 puffs twice a day for maintenance. She is to let us know if she is not getting back to baseline.

## 2014-01-10 NOTE — Progress Notes (Signed)
   Subjective:    Patient ID: Melinda Griffin, female    DOB: Jun 07, 1941, 73 y.o.   MRN: [id number]  HPI The patient comes in today for followup of her known asthma. She had been doing well until approximately 2-3 days ago when she began to notice increasing shortness of breath, chest congestion with cough of white mucus, and increased wheezing. She did not have a rescue inhaler to use, and worsened through the weekend. She did borrow a rescue inhaler from her daughter. She has not had any purulence or fever, and does not feel that she has a chest cold. She has been taking her Advair compliantly, but only one puff twice a day instead of the recommended 2 puffs twice a day.   Review of Systems  Constitutional: Negative for fever and unexpected weight change.  HENT: Positive for postnasal drip and rhinorrhea. Negative for congestion, dental problem, ear pain, nosebleeds, sinus pressure, sneezing, sore throat and trouble swallowing.   Eyes: Negative for redness and itching.  Respiratory: Positive for cough, chest tightness, shortness of breath and wheezing.   Cardiovascular: Negative for palpitations and leg swelling.  Gastrointestinal: Negative for nausea and vomiting.  Genitourinary: Negative for dysuria.  Musculoskeletal: Negative for joint swelling.  Skin: Negative for rash.  Neurological: Negative for headaches.  Hematological: Does not bruise/bleed easily.  Psychiatric/Behavioral: Negative for dysphoric mood. The patient is not nervous/anxious.        Objective:   Physical Exam Well-developed female in no acute distress Nose without purulence or discharge noted Neck without lymphadenopathy or thyromegaly Chest with diffuse wheezes and rhonchi, no crackles Cardiac exam with regular rate and rhythm Lower extremities without edema, no cyanosis Alert and oriented, moves all 4 extremities.       Assessment & Plan:

## 2014-01-11 ENCOUNTER — Telehealth: Payer: Self-pay | Admitting: Pulmonary Disease

## 2014-01-11 NOTE — Telephone Encounter (Signed)
No message needed.  Melinda Griffin

## 2014-01-18 ENCOUNTER — Other Ambulatory Visit: Payer: Self-pay | Admitting: Internal Medicine

## 2014-02-07 ENCOUNTER — Encounter: Payer: Self-pay | Admitting: Internal Medicine

## 2014-02-20 ENCOUNTER — Other Ambulatory Visit: Payer: Self-pay | Admitting: Pulmonary Disease

## 2014-03-16 ENCOUNTER — Other Ambulatory Visit: Payer: Self-pay | Admitting: Pulmonary Disease

## 2014-05-09 ENCOUNTER — Other Ambulatory Visit: Payer: Self-pay | Admitting: Internal Medicine

## 2014-06-06 ENCOUNTER — Other Ambulatory Visit: Payer: Self-pay | Admitting: Internal Medicine

## 2014-07-13 ENCOUNTER — Ambulatory Visit (INDEPENDENT_AMBULATORY_CARE_PROVIDER_SITE_OTHER): Payer: Medicare Other | Admitting: Internal Medicine

## 2014-07-13 ENCOUNTER — Encounter: Payer: Self-pay | Admitting: Internal Medicine

## 2014-07-13 VITALS — BP 120/72 | HR 84 | Temp 98.5°F | Resp 10 | Ht 62.0 in | Wt 137.0 lb

## 2014-07-13 DIAGNOSIS — I10 Essential (primary) hypertension: Secondary | ICD-10-CM

## 2014-07-13 DIAGNOSIS — M720 Palmar fascial fibromatosis [Dupuytren]: Secondary | ICD-10-CM | POA: Insufficient documentation

## 2014-07-13 NOTE — Progress Notes (Signed)
Patient ID: Melinda Griffin, female   DOB: 03-Dec-1940, 73 y.o.   MRN: [id number]    Facility  [facility]    Place of Service:   OFFICE   Allergies  Allergen Reactions  . Aspirin   . Ceftin   . Ciprofloxacin Hcl     Dizzy, diarrhea    Chief Complaint  Patient presents with  . Acute Visit    Nodules on the palm of left hand x 1 week or less     HPI:  Dupuytren's contracture: nodule left palm appeared after gardening. Minimal discomfort.   Essential hypertension: controlled    Medications: Patient's Medications  New Prescriptions   No medications on file  Previous Medications   ALBUTEROL (PROAIR HFA) 108 (90 BASE) MCG/ACT INHALER    INHALE 2 PUFFS UP TO 4 TIMES A DAY TO HELP BREATHING   CETIRIZINE (ZYRTEC) 10 MG TABLET    Take one tablet once daily for allergies   CHOLECALCIFEROL (VITAMIN D3) 2000 UNITS TABS    Take by mouth daily.   CITALOPRAM (CELEXA) 40 MG TABLET    TAKE 1 TABLET BY MOUTH EVERY DAY   FLUTICASONE (FLONASE) 50 MCG/ACT NASAL SPRAY    USE 1 SPRAY IN EACH NOSTRIL EVERY DAY AS NEEDED FOR ALLERGIES   FLUTICASONE-SALMETEROL (ADVAIR HFA) 230-21 MCG/ACT INHALER    Inhale 2 puffs into the lungs 2 (two) times daily.   LOVASTATIN (MEVACOR) 40 MG TABLET    TAKE 1 TABLET EVERY DAY TO LOWER CHLOESTEROL   MONTELUKAST (SINGULAIR) 10 MG TABLET    TAKE 1 TABLET EVERY DAY   MULTIPLE VITAMINS-MINERALS (MULTI FOR HER 50+) CAPS    Take by mouth daily.  Modified Medications   No medications on file  Discontinued Medications   CHOLECALCIFEROL (VITAMIN D3) 2000 UNITS CAPSULE    Take 1 capsule (2,000 Units total) by mouth daily.   DIPTHERIA-TETANUS TOXOIDS (DECAVAC) 2-2 LF/0.5ML INJECTION    Inject 0.5 mLs into the muscle once.   FLUTICASONE (FLONASE) 50 MCG/ACT NASAL SPRAY    USE 1 SPRAY IN EACH NOSTRIL EVERY DAY AS NEEDED FOR ALLERGIES   LOVASTATIN (MEVACOR) 40 MG TABLET    TAKE 1 TABLET EVERY DAY TO LOWER CHLOESTEROL   PREDNISONE (DELTASONE) 10 MG TABLET    Take 4 tablets daily x  2 days, then 3 tabs daily x 2 days, then 2 tabs daily x 2 days, ten 1 tab daily x 2 days, then stop.     Review of Systems  Constitutional: Negative for fever and fatigue.  HENT: Negative.   Eyes: Negative.   Respiratory: Negative.        Hx asthma  Cardiovascular: Negative for chest pain, palpitations and leg swelling.  Gastrointestinal: Negative for nausea, abdominal pain, diarrhea, constipation and abdominal distention.  Endocrine: Negative.   Genitourinary: Negative.   Musculoskeletal:       Nodules in the left palm  Skin: Negative.   Allergic/Immunologic: Negative.   Neurological: Negative.   Hematological: Negative.   Psychiatric/Behavioral: The patient is nervous/anxious.     Filed Vitals:   07/13/14 1341  BP: 120/72  Pulse: 84  Temp: 98.5 F (36.9 C)  TempSrc: Oral  Resp: 10  Height: 5\' 2"  (1.575 m)  Weight: 137 lb (62.143 kg)   Body mass index is 25.05 kg/(m^2).  Physical Exam  Constitutional: She is oriented to person, place, and time. She appears well-developed and well-nourished. No distress.  Neck: No JVD present. No thyromegaly present.  Cardiovascular: Normal rate,  regular rhythm and normal heart sounds.   Pulmonary/Chest: Effort normal and breath sounds normal. She has no wheezes. She has no rales.  Abdominal: She exhibits no distension and no mass. There is no tenderness.  Musculoskeletal: Normal range of motion.  Ambulates independently Dupuytren's contractures left palm at 3rd and 4th tendons.  Lymphadenopathy:    She has no cervical adenopathy.  Neurological: She is alert and oriented to person, place, and time.  Psychiatric: She has a normal mood and affect.     Labs reviewed: No visits with results within 3 Month(s) from this visit. Latest known visit with results is:  Appointment on 11/25/2013  Component Date Value Ref Range Status  . WBC 11/25/2013 7.3  3.4 - 10.8 x10E3/uL Final  . RBC 11/25/2013 4.48  3.77 - 5.28 x10E6/uL Final  .  Hemoglobin 11/25/2013 13.9  11.1 - 15.9 g/dL Final  . HCT 11/25/2013 40.3  34.0 - 46.6 % Final  . MCV 11/25/2013 90  79 - 97 fL Final  . MCH 11/25/2013 31.0  26.6 - 33.0 pg Final  . MCHC 11/25/2013 34.5  31.5 - 35.7 g/dL Final  . RDW 11/25/2013 13.9  12.3 - 15.4 % Final  . Neutrophils Relative % 11/25/2013 49   Final  . Lymphs 11/25/2013 35   Final  . Monocytes 11/25/2013 8   Final  . Eos 11/25/2013 7   Final  . Basos 11/25/2013 1   Final  . Neutrophils Absolute 11/25/2013 3.5  1.4 - 7.0 x10E3/uL Final  . Lymphocytes Absolute 11/25/2013 2.6  0.7 - 3.1 x10E3/uL Final  . Monocytes Absolute 11/25/2013 0.6  0.1 - 0.9 x10E3/uL Final  . Eosinophils Absolute 11/25/2013 0.5* 0.0 - 0.4 x10E3/uL Final  . Basophils Absolute 11/25/2013 0.1  0.0 - 0.2 x10E3/uL Final  . Immature Granulocytes 11/25/2013 0   Final  . Immature Grans (Abs) 11/25/2013 0.0  0.0 - 0.1 x10E3/uL Final  . Glucose 11/25/2013 89  65 - 99 mg/dL Final  . BUN 11/25/2013 16  8 - 27 mg/dL Final  . Creatinine, Ser 11/25/2013 0.67  0.57 - 1.00 mg/dL Final  . GFR calc non Af Amer 11/25/2013 87  >59 mL/min/1.73 Final  . GFR calc Af Amer 11/25/2013 100  >59 mL/min/1.73 Final  . BUN/Creatinine Ratio 11/25/2013 24  11 - 26 Final  . Sodium 11/25/2013 143  134 - 144 mmol/L Final  . Potassium 11/25/2013 4.1  3.5 - 5.2 mmol/L Final  . Chloride 11/25/2013 102  97 - 108 mmol/L Final  . CO2 11/25/2013 25  18 - 29 mmol/L Final  . Calcium 11/25/2013 9.6  8.7 - 10.3 mg/dL Final  . Total Protein 11/25/2013 6.8  6.0 - 8.5 g/dL Final  . Albumin 11/25/2013 4.3  3.5 - 4.8 g/dL Final  . Globulin, Total 11/25/2013 2.5  1.5 - 4.5 g/dL Final  . Albumin/Globulin Ratio 11/25/2013 1.7  1.1 - 2.5 Final  . Total Bilirubin 11/25/2013 0.3  0.0 - 1.2 mg/dL Final  . Alkaline Phosphatase 11/25/2013 73  39 - 117 IU/L Final  . AST 11/25/2013 20  0 - 40 IU/L Final  . ALT 11/25/2013 17  0 - 32 IU/L Final  . Cholesterol, Total 11/25/2013 179  100 - 199 mg/dL Final    . Triglycerides 11/25/2013 111  0 - 149 mg/dL Final  . HDL 11/25/2013 62  >39 mg/dL Final   Comment: According to ATP-III Guidelines, HDL-C >59 mg/dL is considered a  negative risk factor for CHD.  Marland Kitchen VLDL Cholesterol Cal 11/25/2013 22  5 - 40 mg/dL Final  . LDL Calculated 11/25/2013 95  0 - 99 mg/dL Final  . Chol/HDL Ratio 11/25/2013 2.9  0.0 - 4.4 ratio units Final   Comment:                                   T. Chol/HDL Ratio                                                                      Men  Women                                                        1/2 Avg.Risk  3.4    3.3                                                            Avg.Risk  5.0    4.4                                                         2X Avg.Risk  9.6    7.1                                                         3X Avg.Risk 23.4   11.0  . Hgb A1c MFr Bld 11/25/2013 6.0* 4.8 - 5.6 % Final   Comment:          Increased risk for diabetes: 5.7 - 6.4                                   Diabetes: >6.4                                   Glycemic control for adults with diabetes: <7.0  . Est. average glucose Bld gHb Est-m* 11/25/2013 126   Final     Assessment/Plan  1. Dupuytren's contracture - Ambulatory referral to Hand Surgery  2. Essential hypertension controlled

## 2014-08-15 ENCOUNTER — Encounter: Payer: Self-pay | Admitting: Internal Medicine

## 2014-08-15 ENCOUNTER — Ambulatory Visit (INDEPENDENT_AMBULATORY_CARE_PROVIDER_SITE_OTHER): Payer: Medicare Other | Admitting: Internal Medicine

## 2014-08-15 VITALS — BP 112/72 | HR 88 | Temp 97.3°F | Resp 10 | Ht 62.0 in | Wt 137.0 lb

## 2014-08-15 DIAGNOSIS — F4323 Adjustment disorder with mixed anxiety and depressed mood: Secondary | ICD-10-CM

## 2014-08-15 DIAGNOSIS — M720 Palmar fascial fibromatosis [Dupuytren]: Secondary | ICD-10-CM

## 2014-08-15 DIAGNOSIS — J4520 Mild intermittent asthma, uncomplicated: Secondary | ICD-10-CM

## 2014-08-15 DIAGNOSIS — G3184 Mild cognitive impairment, so stated: Secondary | ICD-10-CM

## 2014-08-15 DIAGNOSIS — R739 Hyperglycemia, unspecified: Secondary | ICD-10-CM

## 2014-08-15 DIAGNOSIS — I10 Essential (primary) hypertension: Secondary | ICD-10-CM

## 2014-08-15 NOTE — Progress Notes (Signed)
Patient ID: Melinda Griffin, female   DOB: 02/10/1941, 73 y.o.   MRN: [id number]   Location:  [facility]  Code Status: DNR, advance directive is NOT on record  Allergies  Allergen Reactions  . Aspirin   . Ceftin   . Ciprofloxacin Hcl     Dizzy, diarrhea    Chief Complaint  Patient presents with  . Medical Management of Chronic Issues    6 month follow-up     HPI: Patient is a 73 y.o. white female seen in the office today for med mgt chronic diseases including mild cognitive impairment, migraines, asthma, anxiety, osteoarthritis, hyperlipidemia and hypertension.    Came to see Dr. Nyoka Cowden with Dupuytren's contracture--sent her to hand center--Dr. Fredna Dow (sp?) agreed.  Using a brace on her hand now.  She was told only treatment would be invasive.  Unless it actually contracts and she can't put her hand flat anymore, he would not do any invasive treatment.  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  Bothers her most when she reaches for her seatbelt.  Has hard brace for at night and as needed.  Is irritating her wrist area so she is going to call and ask them about that.    Allergies have done well.    No med changes.    Has been seeing Dr. Gwenette Greet for a long time.  She only goes every year.  No flare-ups in fall.  Has used rescue inhaler.    Memory doing fine.  If she does the slightest thing off, her husband thinks she is losing her mind.  She misplaced her keys b/c she didn't put them in the right pocket.    Review of Systems:  Review of Systems  Constitutional: Negative for fever and malaise/fatigue.  HENT: Negative for congestion.   Eyes: Negative for blurred vision.  Respiratory: Negative for shortness of breath.   Cardiovascular: Negative for chest pain and leg swelling.  Gastrointestinal: Negative for abdominal pain, constipation, blood in stool and melena.  Genitourinary: Negative for dysuria.  Musculoskeletal:  Negative for falls.       Left hand and thumb pain  Skin: Negative for rash.  Neurological: Negative for dizziness, loss of consciousness and weakness.  Endo/Heme/Allergies: Does not bruise/bleed easily.  Psychiatric/Behavioral: Positive for memory loss. Negative for depression. The patient is not nervous/anxious.        Mild cognitive impairment only, fully functional    Past Medical History  Diagnosis Date  . Osteoarthritis   . Hyperlipidemia   . Hypertension   . Asthma   . Anxiety   . Migraine with aura, without mention of intractable migraine without mention of status migrainosus   . Diabetes mellitus without complication     Past Surgical History  Procedure Laterality Date  . Appendectomy  1975  . Tubal ligation Bilateral 1976  . Breast biopsy  1976    Social History:   reports that she has never smoked. She does not have any smokeless tobacco history on file. She reports that she does not drink alcohol or use illicit drugs.  Family History  Problem Relation Age of Onset  . Asthma Father   . Asthma Sister   . Heart disease Sister   . Cancer Mother     brain, breast  . Cancer Brother 84  . Heart disease Brother     Medications: Patient's Medications  New Prescriptions   No medications on file  Previous Medications   ALBUTEROL (PROAIR HFA) 108 (90 BASE) MCG/ACT INHALER    INHALE 2 PUFFS UP TO 4 TIMES A DAY TO HELP BREATHING   CETIRIZINE (ZYRTEC) 10 MG TABLET    Take one tablet once daily for allergies   CHOLECALCIFEROL (VITAMIN D3) 2000 UNITS TABS    Take by mouth daily.   CITALOPRAM (CELEXA) 40 MG TABLET    TAKE 1 TABLET BY MOUTH EVERY DAY   FLUTICASONE (FLONASE) 50 MCG/ACT NASAL SPRAY    USE 1 SPRAY IN EACH NOSTRIL EVERY DAY AS NEEDED FOR ALLERGIES   FLUTICASONE-SALMETEROL (ADVAIR HFA) 230-21 MCG/ACT INHALER    Inhale 2 puffs into the lungs 2 (two) times daily.   LOVASTATIN (MEVACOR) 40 MG TABLET    TAKE 1 TABLET EVERY DAY TO LOWER CHLOESTEROL   MONTELUKAST  (SINGULAIR) 10 MG TABLET    TAKE 1 TABLET EVERY DAY   MULTIPLE VITAMINS-MINERALS (MULTI FOR HER 50+) CAPS    Take by mouth daily.  Modified Medications   No medications on file  Discontinued Medications   No medications on file     Physical Exam: Filed Vitals:   08/15/14 0959  BP: 112/72  Pulse: 88  Temp: 97.3 F (36.3 C)  TempSrc: Oral  Resp: 10  Height: 5\' 2"  (1.575 m)  Weight: 137 lb (62.143 kg)  SpO2: 94%  Physical Exam  Constitutional: She is oriented to person, place, and time. She appears well-developed and well-nourished. No distress.  Cardiovascular: Normal rate, regular rhythm, normal heart sounds and intact distal pulses.   Pulmonary/Chest: Effort normal and breath sounds normal. No respiratory distress. She has no wheezes.  Abdominal: Soft. Bowel sounds are normal. She exhibits no distension and no mass. There is no tenderness.  Musculoskeletal: Normal range of motion. She exhibits tenderness.  Of left palm over nodules and at base of thumb  Neurological: She is alert and oriented to person, place, and time.  Skin: Skin is warm and dry.     Labs reviewed: Basic Metabolic Panel:  Recent Labs  11/25/13 0854  NA 143  K 4.1  CL 102  CO2 25  GLUCOSE 89  BUN 16  CREATININE 0.67  CALCIUM 9.6   Liver Function Tests:  Recent Labs  11/25/13 0854  AST 20  ALT 17  ALKPHOS 73  BILITOT 0.3  PROT 6.8   No results for input(s): LIPASE, AMYLASE in the last 8760 hours. No results for input(s): AMMONIA in the last 8760 hours. CBC:  Recent Labs  11/25/13 0854  WBC 7.3  NEUTROABS 3.5  HGB 13.9  HCT 40.3  MCV 90   Lipid Panel:  Recent Labs  11/25/13 0854  HDL 62  LDLCALC 95  TRIG 111  CHOLHDL 2.9   Lab Results  Component Value Date   HGBA1C 6.0* 11/25/2013   Assessment/Plan 1. Hyperglycemia - will check f/u labs today--admits to some cheating when traveling to [place] and [facility] - Hemoglobin A1c  - CBC With differential/Platelet;  Future - Comprehensive metabolic panel; Future - Hemoglobin A1c; Future - Lipid panel; Future  2. Dupuytren's contracture -cont use of brace on left hand--soft in days and hard at night -she is going to contact orthopedics due to concerns about soreness of wrist when using her left wrist  3. Essential hypertension -bp well controlled  4. Mild cognitive impairment with memory loss -has been stable since off benzos -cont to monitor  5. Asthma, chronic, mild intermittent, uncomplicated -doing well -sees Dr. Gwenette Greet annually -  cont singulair, advair,  -cont flonase and zyrtec for allergies  6. Adjustment disorder with mixed anxiety and depressed mood -cont celexa -does not tolerate benzos well--they cause confusion  Labs/tests ordered:   Orders Placed This Encounter  Procedures  . Hemoglobin A1c  . CBC With differential/Platelet    Standing Status: Future     Number of Occurrences:      Standing Expiration Date: 08/16/2015  . Comprehensive metabolic panel    Standing Status: Future     Number of Occurrences:      Standing Expiration Date: 08/16/2015    Order Specific Question:  Has the patient fasted?    Answer:  Yes  . Hemoglobin A1c    Standing Status: Future     Number of Occurrences:      Standing Expiration Date: 08/16/2015  . Lipid panel    Standing Status: Future     Number of Occurrences:      Standing Expiration Date: 08/16/2015    Order Specific Question:  Has the patient fasted?    Answer:  Yes    Next appt:  6 mos with labs before  Brittanni Cariker L. Alessa Mazur, D.O. [facility] [address], [place] [address] Cell Phone (Mon-Fri 8am-5pm):  [phone] On Call:  [phone] & follow prompts after 5pm & weekends Office Phone:  [phone] Office Fax:  [phone]

## 2014-08-16 LAB — HEMOGLOBIN A1C
Est. average glucose Bld gHb Est-mCnc: 126 mg/dL
Hgb A1c MFr Bld: 6 % — ABNORMAL HIGH (ref 4.8–5.6)

## 2014-09-11 ENCOUNTER — Other Ambulatory Visit: Payer: Self-pay | Admitting: Internal Medicine

## 2014-10-25 ENCOUNTER — Telehealth: Payer: Self-pay | Admitting: *Deleted

## 2014-10-25 NOTE — Telephone Encounter (Signed)
Are the [facility] physicians going to send her records to [facility]?  They will need them to know what to follow up about--usually these studies aren't transferable between practices.  I can write the referral if she knows all of that is worked out.  She otherwise has no ongoing need to see them.

## 2014-10-25 NOTE — Telephone Encounter (Signed)
Patient called and stated that in the past she has follow up with a [facility] neurologist due to a study she participated in 2 years ago. Insurance will no longer pay for her to see [facility] so she needs a referral to [facility] to follow up. Please Advise.

## 2014-10-26 ENCOUNTER — Other Ambulatory Visit: Payer: Self-pay

## 2014-10-26 DIAGNOSIS — Z1231 Encounter for screening mammogram for malignant neoplasm of breast: Secondary | ICD-10-CM

## 2014-10-26 NOTE — Telephone Encounter (Signed)
Patient Notified and she will call [facility] to make sure they will transfer the records and call us back to let us know.

## 2014-11-28 ENCOUNTER — Ambulatory Visit
Admission: RE | Admit: 2014-11-28 | Discharge: 2014-11-28 | Disposition: A | Discharge status: Home / Self Care | Payer: Medicare Other | Source: Ambulatory Visit

## 2014-11-28 DIAGNOSIS — Z1231 Encounter for screening mammogram for malignant neoplasm of breast: Secondary | ICD-10-CM

## 2014-11-30 ENCOUNTER — Other Ambulatory Visit: Payer: Self-pay | Admitting: Nurse Practitioner

## 2014-12-12 ENCOUNTER — Other Ambulatory Visit: Payer: Self-pay | Admitting: Internal Medicine

## 2015-01-09 ENCOUNTER — Encounter: Payer: Self-pay | Admitting: Diagnostic Neuroimaging

## 2015-01-09 ENCOUNTER — Ambulatory Visit (INDEPENDENT_AMBULATORY_CARE_PROVIDER_SITE_OTHER): Payer: Medicare Other | Admitting: Diagnostic Neuroimaging

## 2015-01-09 VITALS — BP 141/67 | HR 80 | Ht 62.0 in | Wt 138.2 lb

## 2015-01-09 DIAGNOSIS — G3184 Mild cognitive impairment, so stated: Secondary | ICD-10-CM | POA: Diagnosis not present

## 2015-01-09 NOTE — Progress Notes (Signed)
[facility]  PATIENT: Melinda Griffin DOB: September 25, 1940  REFERRING CLINICIAN: Mariea Clonts  / Lavone Neri HISTORY FROM: patient (outside records reviewed) REASON FOR VISIT: new consult    HISTORICAL  CHIEF COMPLAINT:  Chief Complaint  Patient presents with  . New Evaluation    mild cognitive impairement     HISTORY OF PRESENT ILLNESS:   74 year old female here for evaluation of memory problems. Patient developed some mild memory problems around 2010, with some difficulty remembering names, word finding, taking more time to prepare meals. However she did not have significant difficulty with most of her activities of daily living. Patient minimizes any significant memory problems. Apparently her family was more concerned. Through the help of her daughter, patient enrolled into some type of research study at the [facility] with Dr. Alford Highland (2012), who evaluated patient with PET scan and neuropsychological testing. Patient was found to have some mild cognitive deficiencies mainly in areas of memory and executive functioning. She was diagnosed with mild cognitive impairment. She was managed conservatively. Patient followed up with Dr. Lavone Neri in 2014 at [facility], who also agreed with the diagnosis.  Since that time patient has retired earlier in 2016, on her own terms. She was doing well with her work-related performance. She does retired to spend more time with her family enjoy vacation and travel.   REVIEW OF SYSTEMS: Full 14 system review of systems performed and notable only for anxiety, diagnosed 2012, on citalopram and intermittent alprazolam.  ALLERGIES: Allergies  Allergen Reactions  . Aspirin   . Ceftin   . Ciprofloxacin Hcl     Dizzy, diarrhea    HOME MEDICATIONS: Outpatient Prescriptions Prior to Visit  Medication Sig Dispense Refill  . albuterol (PROAIR HFA) 108 (90 BASE) MCG/ACT inhaler INHALE 2 PUFFS UP TO 4 TIMES A DAY TO HELP BREATHING 1 Inhaler 6  . cetirizine  (ZYRTEC) 10 MG tablet Take one tablet once daily for allergies    . Cholecalciferol (VITAMIN D3) 2000 UNITS TABS Take by mouth daily.    . citalopram (CELEXA) 40 MG tablet TAKE 1 TABLET BY MOUTH ONCE DAILY 30 tablet 2  . fluticasone (FLONASE) 50 MCG/ACT nasal spray USE 1 SPRAY IN EACH NOSTRIL EVERY DAY AS NEEDED FOR ALLERGIES 16 g 5  . fluticasone-salmeterol (ADVAIR HFA) 230-21 MCG/ACT inhaler Inhale 2 puffs into the lungs 2 (two) times daily. 1 Inhaler 6  . lovastatin (MEVACOR) 40 MG tablet TAKE 1 TABLET EVERY DAY TO LOWER CHLOESTEROL 30 tablet 5  . montelukast (SINGULAIR) 10 MG tablet TAKE 1 TABLET EVERY DAY 30 tablet 11  . Multiple Vitamins-Minerals (MULTI FOR HER 50+) CAPS Take by mouth daily.    Marland Kitchen lovastatin (MEVACOR) 40 MG tablet TAKE 1 TABLET EVERY DAY TO LOWER CHLOESTEROL (Patient not taking: Reported on 01/09/2015) 30 tablet 2   No facility-administered medications prior to visit.    PAST MEDICAL HISTORY: Past Medical History  Diagnosis Date  . Osteoarthritis   . Hyperlipidemia   . Hypertension   . Asthma   . Anxiety   . Migraine with aura, without mention of intractable migraine without mention of status migrainosus   . Diabetes mellitus without complication     PAST SURGICAL HISTORY: Past Surgical History  Procedure Laterality Date  . Appendectomy  1975  . Tubal ligation Bilateral 1976  . Breast biopsy  1976    FAMILY HISTORY: Family History  Problem Relation Age of Onset  . Asthma Father   . Asthma Sister   .  Heart disease Sister   . Cancer Mother     brain, breast  . Cancer Brother 52  . Heart disease Brother     SOCIAL HISTORY:  History   Social History  . Marital Status: Married    Spouse Name: [name]  . Number of Children: 2  . Years of Education: college    Occupational History  . Not on file.   Social History Main Topics  . Smoking status: Never Smoker   . Smokeless tobacco: Not on file  . Alcohol Use: No  . Drug Use: No  . Sexual  Activity: Not on file   Other Topics Concern  . Not on file   Social History Narrative   Lives at home with Husband   Drinks 1 cup of coffee a day and 1 diet soda a day      PHYSICAL EXAM  Filed Vitals:   01/09/15 1119  BP: 141/67  Pulse: 80  Height: 5\' 2"  (1.575 m)  Weight: 138 lb 3.2 oz (62.687 kg)    Body mass index is 25.27 kg/(m^2).   Visual Acuity Screening   Right eye Left eye Both eyes  Without correction: 20/50 20/30   With correction:       MMSE - Mini Mental State Exam 01/09/2015  Orientation to time 5  Orientation to Place 5  Registration 3  Attention/ Calculation 4  Recall 1  Language- name 2 objects 2  Language- repeat 0  Language- follow 3 step command 3  Language- read & follow direction 1  Write a sentence 1  Copy design 1  Total score 26    GENERAL EXAM: Patient is in no distress; well developed, nourished and groomed; neck is supple  CARDIOVASCULAR: Regular rate and rhythm, no murmurs, no carotid bruits  NEUROLOGIC: MENTAL STATUS: awake, alert, oriented to person, place and time, recent and remote memory intact, normal attention and concentration, language fluent, comprehension intact, naming intact, fund of knowledge appropriate; NO FRONTAL RELEASE SIGNS;  CRANIAL NERVE: no papilledema on fundoscopic exam, pupils equal and reactive to light, visual fields full to confrontation, extraocular muscles intact, no nystagmus, facial sensation and strength symmetric, hearing intact, palate elevates symmetrically, uvula midline, shoulder shrug symmetric, tongue midline. MOTOR: normal bulk and tone, full strength in the BUE, BLE; MILD POSTURAL TREMOR SENSORY: normal and symmetric to light touch, pinprick, temperature, vibration  COORDINATION: finger-nose-finger, fine finger movements normal REFLEXES: deep tendon reflexes present and symmetric GAIT/STATION: narrow based gait; able to walk tandem; romberg is negative    DIAGNOSTIC DATA (LABS,  IMAGING, TESTING) - I reviewed patient records, labs, notes, testing and imaging myself where available.  Lab Results  Component Value Date   WBC 7.3 11/25/2013   HGB 13.9 11/25/2013   HCT 40.3 11/25/2013   MCV 90 11/25/2013      Component Value Date/Time   NA 143 11/25/2013 0854   K 4.1 11/25/2013 0854   CL 102 11/25/2013 0854   CO2 25 11/25/2013 0854   GLUCOSE 89 11/25/2013 0854   BUN 16 11/25/2013 0854   CREATININE 0.67 11/25/2013 0854   CALCIUM 9.6 11/25/2013 0854   PROT 6.8 11/25/2013 0854   AST 20 11/25/2013 0854   ALT 17 11/25/2013 0854   ALKPHOS 73 11/25/2013 0854   BILITOT 0.3 11/25/2013 0854   GFRNONAA 87 11/25/2013 0854   GFRAA 100 11/25/2013 0854   Lab Results  Component Value Date   CHOL 179 11/25/2013   HDL 62 11/25/2013  [place] 95 11/25/2013   TRIG 111 11/25/2013   CHOLHDL 2.9 11/25/2013   Lab Results  Component Value Date   HGBA1C 6.0* 08/15/2014   No results found for: VITAMINB12 No results found for: TSH   05/28/11 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 Moderate inflamm changes of the paranasal sinuses.     ASSESSMENT AND PLAN  74 y.o. year old female here with mild memory problems since 2010, still independent with ADLs. No significant progression since 2012. Most likely represents mild cognitive impairment. I concur with prior assessments by Dr. Alford Highland and Dr. Lavone Neri. I reviewed and summarized their results in the HPI.   Dx: Amnestic MCI (mild cognitive impairment with memory loss)    PLAN: - Advised patient to continue focusing on healthy nutrition, daily physical activity, mental and socially stimulating activities - May follow up as needed  Return if symptoms worsen or fail to improve, for return to PCP.  I spent 30 minutes of face to face time with patient. Greater than 50% of time was spent in counseling and coordination of care with patient.     Penni Bombard, MD 03/14/9469, 96:28 PM Certified in Neurology,  Neurophysiology and Neuroimaging  [facility] [address] [place], [place] [address] [phone]

## 2015-01-10 ENCOUNTER — Encounter: Payer: Self-pay | Admitting: Diagnostic Neuroimaging

## 2015-03-05 ENCOUNTER — Other Ambulatory Visit: Payer: Self-pay | Admitting: Internal Medicine

## 2015-03-25 ENCOUNTER — Other Ambulatory Visit: Payer: Self-pay | Admitting: Internal Medicine

## 2015-03-29 ENCOUNTER — Telehealth: Payer: Self-pay | Admitting: Pulmonary Disease

## 2015-03-29 MED ORDER — MONTELUKAST SODIUM 10 MG PO TABS: 10.0000 mg | ORAL_TABLET | Freq: Every day | ORAL | Status: DC

## 2015-03-29 NOTE — Telephone Encounter (Signed)
lmtcb X1 for pt. 1 month supply sent to below listed pharmacy until pt is seen in office.

## 2015-03-30 NOTE — Telephone Encounter (Signed)
Spoke with the pt and she has already picked up rx  Nothing further needed

## 2015-03-30 NOTE — Telephone Encounter (Signed)
[id number] 

## 2015-04-14 DIAGNOSIS — R208 Other disturbances of skin sensation: Secondary | ICD-10-CM | POA: Diagnosis not present

## 2015-04-14 DIAGNOSIS — L820 Inflamed seborrheic keratosis: Secondary | ICD-10-CM | POA: Diagnosis not present

## 2015-04-18 ENCOUNTER — Institutional Professional Consult (permissible substitution): Payer: Medicare Other | Admitting: Pulmonary Disease

## 2015-05-08 ENCOUNTER — Telehealth: Payer: Self-pay | Admitting: *Deleted

## 2015-05-08 NOTE — Telephone Encounter (Signed)
Patient notified, left message on her voicemail.

## 2015-05-08 NOTE — Telephone Encounter (Signed)
Patient called insisting a Rx for a pneumonia shot so she can get it from her pharmacy. I show patient having her Pneumonia injections. Please Advise.

## 2015-05-08 NOTE — Telephone Encounter (Signed)
If she has had both injections since she turned 74 yo, she does not need anymore.  Tell her that is what I said.

## 2015-05-11 ENCOUNTER — Encounter: Payer: Self-pay | Admitting: Pulmonary Disease

## 2015-05-11 ENCOUNTER — Ambulatory Visit (INDEPENDENT_AMBULATORY_CARE_PROVIDER_SITE_OTHER): Payer: Medicare Other | Admitting: Pulmonary Disease

## 2015-05-11 ENCOUNTER — Telehealth: Payer: Self-pay | Admitting: Pulmonary Disease

## 2015-05-11 VITALS — BP 130/62 | HR 77 | Ht 62.0 in | Wt 139.4 lb

## 2015-05-11 DIAGNOSIS — J4520 Mild intermittent asthma, uncomplicated: Secondary | ICD-10-CM

## 2015-05-11 DIAGNOSIS — Z8669 Personal history of other diseases of the nervous system and sense organs: Secondary | ICD-10-CM | POA: Insufficient documentation

## 2015-05-11 DIAGNOSIS — J309 Allergic rhinitis, unspecified: Secondary | ICD-10-CM

## 2015-05-11 MED ORDER — MONTELUKAST SODIUM 10 MG PO TABS: 10.0000 mg | ORAL_TABLET | Freq: Every day | ORAL | Status: DC

## 2015-05-11 NOTE — Patient Instructions (Signed)
1.  Continue taking your Advair inhaler 2 puffs once daily as you have been. Around December-January you can try going to one puff daily. 2.  I am not adjusting your Singulair or Flonase nasal spray. 3.  We will repeat breathing tests at your next appointment to monitor your lung function. 4.  I will see you back in 6 months or sooner if needed. Please call my office if you have any new breathing problems.

## 2015-05-11 NOTE — Progress Notes (Signed)
Subjective:    Patient ID: Melinda Griffin, female    DOB: [birth year], 74 y.o.   MRN: [id number]  C.C.:  Follow-up for Asthma & Allergic Rhinitis.  HPI Asthma: Patient previously treated with Advair 500/50 in 2002 but had a raspy voice with this medication dose. She reports cologne, perfume, odors, and smoke incites coughing episodes & difficulty breathing historically. She also notices increased dyspnea and coughing in the Fall of the year. She takes Singulair during this time of the year. No recent coughing or wheezing. No nocturnal awakenings recently. She has been using 2 inhalations of Advair HFA 230/21 once daily all Summer. She reports very rare need for her rescue inhaler. No exacerbations since last appointment.   Allergic Rhinitis:  Patient does have dogs and a cat. She denies any problems with sinus congestion or drainage in the Spring. She does take Singulair particularly in the Fall. She takes Zyrtec on a daily basis. She also uses Flonase on a seasonal basis - Fall.  Review of Systems No chest pain or pressure. No reflux, dyspepsia, or morning brash water taste.   Allergies  Allergen Reactions  . Aspirin   . Ceftin   . Ciprofloxacin Hcl     Dizzy, diarrhea   Current Outpatient Prescriptions on File Prior to Visit  Medication Sig Dispense Refill  . albuterol (PROAIR HFA) 108 (90 BASE) MCG/ACT inhaler INHALE 2 PUFFS UP TO 4 TIMES A DAY TO HELP BREATHING (Patient taking differently: INHALE 2 PUFFS UP TO 4 TIMES A DAY as neededTO HELP BREATHING) 1 Inhaler 6  . Biotin 5000 MCG CAPS Take by mouth. W/ vit c and vit d    . cetirizine (ZYRTEC) 10 MG tablet Take one tablet once daily for allergies    . Cholecalciferol (VITAMIN D3) 2000 UNITS TABS Take by mouth daily.    . citalopram (CELEXA) 40 MG tablet TAKE 1 TABLET BY MOUTH ONCE DAILY 30 tablet 2  . fluticasone (FLONASE) 50 MCG/ACT nasal spray USE 1 SPRAY IN EACH NOSTRIL EVERY DAY AS NEEDED FOR ALLERGIES 16 g 5  .  fluticasone-salmeterol (ADVAIR HFA) 230-21 MCG/ACT inhaler Inhale 2 puffs into the lungs 2 (two) times daily. 1 Inhaler 6  . lovastatin (MEVACOR) 40 MG tablet TAKE 1 TABLET EVERY DAY TO LOWER CHLOESTEROL 30 tablet 5  . Multiple Vitamins-Minerals (MULTI FOR HER 50+) CAPS Take by mouth daily.    . montelukast (SINGULAIR) 10 MG tablet Take 1 tablet (10 mg total) by mouth daily. (Patient not taking: Reported on 05/11/2015) 30 tablet 0   No current facility-administered medications on file prior to visit.   Past Medical History  Diagnosis Date  . Hyperlipidemia   . Asthma   . Anxiety   . Migraine with aura, without mention of intractable migraine without mention of status migrainosus   . Allergic rhinitis    Past Surgical History  Procedure Laterality Date  . Appendectomy  1975  . Tubal ligation Bilateral 1976  . Breast biopsy  1976   Family History  Problem Relation Age of Onset  . Asthma Father   . Asthma Sister   . Heart disease Sister   . Cancer Mother     brain, breast  . Cancer Brother 54  . Heart disease Brother   . Hypertension Brother    Social History   Social History  . Marital Status: Married    Spouse Name: [name]  . Number of Children: 2  . Years of Education: college  Social History Main Topics  . Smoking status: Never Smoker   . Smokeless tobacco: None  . Alcohol Use: 0.0 oz/week    0 Standard drinks or equivalent per week     Comment: Rare wine - 1 glass/mo  . Drug Use: No  . Sexual Activity: Not Asked   Other Topics Concern  . 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 She previously lived in [place]. Previously has traveled to [place], [place], [place], [place], [place], [place], [place], [place], [place], [place], & [place]. She has traveled all the way up the [facility] to [place]. Hasn't traveled much in the [place] except for [place]. Prior travel to [place] &  [place]. She currently has 2 dogs & 1 cat. No bird exposure. Previously worked as a Freight forwarder in [employer]. No mold or hot tub exposure. She does have a swimming pool & coy pond.       Objective:   Physical Exam Blood pressure 130/62, pulse 77, height 5\' 2"  (1.575 m), weight 139 lb 6.4 oz (63.231 kg), SpO2 96 %. General:  Awake. Alert. No acute distress.   Integument:  Warm & dry. No rash on exposed skin. No bruising. HEENT:  Moist mucus membranes. No oral ulcers. No scleral injection or icterus. Minimal nasal turbinate swelling. PERRL. Cardiovascular:  Regular rate. No edema. No appreciable JVD.  Pulmonary:  Good aeration bilaterally. Mild inspiratory squeaks and lower lung zones. Symmetric chest wall expansion. No accessory muscle use on room air. Neurological:  CN 2-12 grossly in tact. No meningismus. Moving all 4 extremities equally. Symmetric patellar deep tendon reflexes.  PFT 05/09/10: FVC 2.24 L (82%) FEV1 1.64 L (80%) FEV1/FVC 0.97 FEF 25-75 1.18 L (68%) 12/19/00: FVC 3.40 L (122%) FEV1 2.16 L (97%) FEV1/FVC 0.64 FEF 25-75 0.43 L (19%)  LABS 11/25/13 CBC: 7.3/30.9/40.3/? BMP: 143/4.1/102/25/16/0.67/89/9.6 LFT: 4.3/?/0.3/73/20/17    Assessment & Plan:  74 year old female with underlying asthma & allergic rhinitis. Both of these seem to be exacerbated by inhaled chemical fumes as well as the fall season. Otherwise, she seems to be well-controlled. I believe it would be of benefit to try to decrease her dose of inhaled corticosteroid to prevent potential side effects. I instructed the patient to notify my office if she had any new breathing problems before her next appointment as I would be happy to see her sooner.  1. Mild, intermittent extrinsic asthma: Continuing patient on Singulair, Zyrtec, & Advair HFA as prescribed. In December-January she will attempt to go to one puff once daily of her Advair. Plan to repeat full pulmonary function testing at next appointment and  possibly even de-escalate inhaled medication from there. 2. Allergic rhinitis: Continuing Flonase, Zyrtec, & Singulair. No changes. 3. Health maintenance: Reports she did receive the influenza vaccine already this year. She also reports she has received Prevnar and Pneumovax vaccines. 4. Follow-up: Patient to return to clinic in 6 months or sooner if needed.

## 2015-05-11 NOTE — Telephone Encounter (Signed)
LABS 11/25/13 CBC: 7.3/30.9/40.3/? BMP: 143/4.1/102/25/16/0.67/89/9.6 LFT: 4.3/?/0.3/73/20/17

## 2015-05-16 ENCOUNTER — Other Ambulatory Visit: Payer: Self-pay | Admitting: *Deleted

## 2015-05-16 MED ORDER — FLUTICASONE-SALMETEROL 230-21 MCG/ACT IN AERO: 2.0000 | INHALATION_SPRAY | Freq: Two times a day (BID) | RESPIRATORY_TRACT | Status: DC

## 2015-05-31 ENCOUNTER — Other Ambulatory Visit: Payer: Self-pay | Admitting: Internal Medicine

## 2015-06-26 ENCOUNTER — Encounter (HOSPITAL_COMMUNITY): Payer: Self-pay

## 2015-06-26 ENCOUNTER — Emergency Department (HOSPITAL_COMMUNITY)
Admission: EM | Admit: 2015-06-26 | Discharge: 2015-06-26 | Disposition: A | Discharge status: Home / Self Care | Payer: Medicare Other | Source: Home / Self Care | Attending: Family Medicine | Admitting: Family Medicine

## 2015-06-26 ENCOUNTER — Emergency Department (INDEPENDENT_AMBULATORY_CARE_PROVIDER_SITE_OTHER): Payer: Medicare Other

## 2015-06-26 DIAGNOSIS — S61219A Laceration without foreign body of unspecified finger without damage to nail, initial encounter: Secondary | ICD-10-CM | POA: Diagnosis not present

## 2015-06-26 DIAGNOSIS — S61412A Laceration without foreign body of left hand, initial encounter: Secondary | ICD-10-CM | POA: Diagnosis not present

## 2015-06-26 MED ORDER — BUPIVACAINE HCL (PF) 0.5 % IJ SOLN
INTRAMUSCULAR | Status: AC
  Filled 2015-06-26: qty 10

## 2015-06-26 NOTE — ED Provider Notes (Signed)
CSN: [id number]     Arrival date & time 06/26/15  1529 History   First MD Initiated Contact with Patient 06/26/15 1555     Chief Complaint  Patient presents with  . Fall  . Hand Injury   (Consider location/radiation/quality/duration/timing/severity/associated sxs/prior Treatment) Patient is a 74 y.o. female presenting with fall and hand injury. The history is provided by the patient and the spouse.  Fall This is a new problem. The current episode started 1 to 2 hours ago. The problem has not changed since onset.Associated symptoms comments: [place] on steps at home with pain and lac to left 5th finger..  Hand Injury   Past Medical History  Diagnosis Date  . Hyperlipidemia   . Asthma   . Anxiety   . Migraine with aura, without mention of intractable migraine without mention of status migrainosus   . Allergic rhinitis    Past Surgical History  Procedure Laterality Date  . Appendectomy  1975  . Tubal ligation Bilateral 1976  . Breast biopsy  1976   Family History  Problem Relation Age of Onset  . Asthma Father   . Asthma Sister   . Heart disease Sister   . Cancer Mother     brain, breast  . Cancer Brother 64  . Heart disease Brother   . Hypertension Brother    Social History  Substance Use Topics  . Smoking status: Never Smoker   . Smokeless tobacco: None  . Alcohol Use: 0.0 oz/week    0 Standard drinks or equivalent per week     Comment: Rare wine - 1 glass/mo   OB History    No data available     Review of Systems  Constitutional: Negative.   HENT: Negative.   Musculoskeletal: Negative.   Skin: Positive for wound.  All other systems reviewed and are negative.   Allergies  Aspirin; Ceftin; and Ciprofloxacin hcl  Home Medications   Prior to Admission medications   Medication Sig Start Date End Date Taking? Authorizing Provider  albuterol (PROAIR HFA) 108 (90 BASE) MCG/ACT inhaler INHALE 2 PUFFS UP TO 4 TIMES A DAY TO HELP BREATHING Patient taking  differently: INHALE 2 PUFFS UP TO 4 TIMES A DAY as neededTO HELP BREATHING 01/10/14   Kathee Delton, MD  Biotin 5000 MCG CAPS Take by mouth. W/ vit c and vit d    Historical Provider, MD  cetirizine (ZYRTEC) 10 MG tablet Take one tablet once daily for allergies    Historical Provider, MD  Cholecalciferol (VITAMIN D3) 2000 UNITS TABS Take by mouth daily.    Historical Provider, MD  citalopram (CELEXA) 40 MG tablet TAKE 1 TABLET BY MOUTH ONCE DAILY 03/06/15   Tiffany L Reed, DO  fluticasone (FLONASE) 50 MCG/ACT nasal spray USE 1 SPRAY IN EACH NOSTRIL EVERY DAY AS NEEDED FOR ALLERGIES 01/18/14   Tiffany L Reed, DO  fluticasone-salmeterol (ADVAIR HFA) 230-21 MCG/ACT inhaler Inhale 2 puffs into the lungs 2 (two) times daily. 05/16/15   Javier Glazier, MD  lovastatin (MEVACOR) 40 MG tablet TAKE 1 TABLET EVERY DAY TO LOWER CHLOESTEROL 09/21/13   Tiffany L Reed, DO  lovastatin (MEVACOR) 40 MG tablet APPOINTMENT OVERDUE, 1 by mouth every day to lower cholesterol 05/31/15   Tiffany L Reed, DO  montelukast (SINGULAIR) 10 MG tablet Take 1 tablet (10 mg total) by mouth daily. 05/11/15   Javier Glazier, MD  Multiple Vitamins-Minerals (MULTI FOR HER 50+) CAPS Take by mouth daily.    Historical Provider,  MD   Meds Ordered and Administered this Visit  Medications - No data to display  BP 135/54 mmHg  Pulse 71  Temp(Src) 98.3 F (36.8 C) (Oral)  SpO2 95% No data found.   Physical Exam  Constitutional: She is oriented to person, place, and time. She appears well-developed and well-nourished. No distress.  Musculoskeletal: She exhibits tenderness.       Hands: Neurological: She is alert and oriented to person, place, and time.  Skin: Skin is warm and dry. Laceration noted.     Nursing note and vitals reviewed.   ED Course  .Marland KitchenLaceration Repair Date/Time: 06/26/2015 5:36 PM Performed by: Billy Fischer Authorized by: Ihor Gully D Consent: Verbal consent obtained. Consent given by:  patient Patient identity confirmed: verbally with patient Body area: upper extremity Location details: left small finger Laceration length: 4 cm Foreign bodies: no foreign bodies Tendon involvement: superficial Nerve involvement: none Vascular damage: no Anesthesia: digital block Local anesthetic: bupivacaine 0.5% without epinephrine Patient sedated: no Preparation: Patient was prepped and draped in the usual sterile fashion. Irrigation solution: saline Amount of cleaning: standard Debridement: minimal Degree of undermining: none Skin closure: 5-0 nylon Number of sutures: 15 Technique: simple and running Approximation: close Approximation difficulty: simple Dressing: antibiotic ointment and 4x4 sterile gauze Patient tolerance: Patient tolerated the procedure well with no immediate complications   (including critical care time)  Labs Review Labs Reviewed - No data to display  Imaging Review Dg Hand Complete Left  06/26/2015  CLINICAL DATA:  Laceration of left hand along the outer aspect of the fifth finger. Initial encounter. EXAM: LEFT HAND - COMPLETE 3+ VIEW COMPARISON:  None. FINDINGS: There is no evidence of acute fracture or dislocation. There is no evidence of arthropathy or other focal bone abnormality. No soft tissue foreign body identified. IMPRESSION: No evidence of acute fracture or soft tissue foreign body. Electronically Signed   By: Aletta Edouard M.D.   On: 06/26/2015 16:11     Visual Acuity Review  Right Eye Distance:   Left Eye Distance:   Bilateral Distance:    Right Eye Near:   Left Eye Near:    Bilateral Near:         MDM   1. Finger laceration, initial encounter    Lac repair to finger .   Billy Fischer, MD 06/26/15 [phone]

## 2015-06-26 NOTE — ED Notes (Signed)
Going up outside steps, lost balance, stumbled and caught her left hand on corner of step . Laceration of 5 th finger , minimal bleeding at present, abrasion to left knee. Denies LOC

## 2015-06-26 NOTE — Discharge Instructions (Signed)
Keep covered as is and return on wed for recheck

## 2015-06-28 ENCOUNTER — Emergency Department (INDEPENDENT_AMBULATORY_CARE_PROVIDER_SITE_OTHER)
Admission: EM | Admit: 2015-06-28 | Discharge: 2015-06-28 | Disposition: A | Discharge status: Home / Self Care | Payer: Medicare Other | Source: Home / Self Care | Attending: Family Medicine | Admitting: Family Medicine

## 2015-06-28 ENCOUNTER — Encounter (HOSPITAL_COMMUNITY): Payer: Self-pay | Admitting: Emergency Medicine

## 2015-06-28 DIAGNOSIS — Z4801 Encounter for change or removal of surgical wound dressing: Secondary | ICD-10-CM | POA: Diagnosis not present

## 2015-06-28 DIAGNOSIS — Z5189 Encounter for other specified aftercare: Secondary | ICD-10-CM

## 2015-06-28 MED ORDER — POVIDONE-IODINE 10 % EX SOLN
CUTANEOUS | Status: AC
  Filled 2015-06-28: qty 118

## 2015-06-28 NOTE — ED Notes (Signed)
Follow up to visit on 10/31.  Patient fell and received cut to left little and ring finger

## 2015-06-28 NOTE — Discharge Instructions (Signed)
Change bandage daily, return in 1 week.

## 2015-06-28 NOTE — ED Provider Notes (Signed)
CSN: [id number]     Arrival date & time 06/28/15  1300 History   First MD Initiated Contact with Patient 06/28/15 1320     Chief Complaint  Patient presents with  . Wound Check   (Consider location/radiation/quality/duration/timing/severity/associated sxs/prior Treatment) Patient is a 74 y.o. female presenting with wound check. The history is provided by the patient.  Wound Check This is a recurrent problem. The current episode started 2 days ago. The problem has been rapidly improving. Associated symptoms comments: Lac check to left 5th finger. nvt intact, dsg changed, betadine soaked..    Past Medical History  Diagnosis Date  . Hyperlipidemia   . Asthma   . Anxiety   . Migraine with aura, without mention of intractable migraine without mention of status migrainosus   . Allergic rhinitis    Past Surgical History  Procedure Laterality Date  . Appendectomy  1975  . Tubal ligation Bilateral 1976  . Breast biopsy  1976   Family History  Problem Relation Age of Onset  . Asthma Father   . Asthma Sister   . Heart disease Sister   . Cancer Mother     brain, breast  . Cancer Brother 63  . Heart disease Brother   . Hypertension Brother    Social History  Substance Use Topics  . Smoking status: Never Smoker   . Smokeless tobacco: None  . Alcohol Use: 0.0 oz/week    0 Standard drinks or equivalent per week     Comment: Rare wine - 1 glass/mo   OB History    No data available     Review of Systems  Constitutional: Negative.   Musculoskeletal: Negative.   Skin: Positive for wound. Negative for color change.  All other systems reviewed and are negative.   Allergies  Aspirin; Ceftin; and Ciprofloxacin hcl  Home Medications   Prior to Admission medications   Medication Sig Start Date End Date Taking? Authorizing Provider  albuterol (PROAIR HFA) 108 (90 BASE) MCG/ACT inhaler INHALE 2 PUFFS UP TO 4 TIMES A DAY TO HELP BREATHING Patient taking differently: INHALE 2 PUFFS  UP TO 4 TIMES A DAY as neededTO HELP BREATHING 01/10/14   Kathee Delton, MD  Biotin 5000 MCG CAPS Take by mouth. W/ vit c and vit d    Historical Provider, MD  cetirizine (ZYRTEC) 10 MG tablet Take one tablet once daily for allergies    Historical Provider, MD  Cholecalciferol (VITAMIN D3) 2000 UNITS TABS Take by mouth daily.    Historical Provider, MD  citalopram (CELEXA) 40 MG tablet TAKE 1 TABLET BY MOUTH ONCE DAILY 03/06/15   Tiffany L Reed, DO  fluticasone (FLONASE) 50 MCG/ACT nasal spray USE 1 SPRAY IN EACH NOSTRIL EVERY DAY AS NEEDED FOR ALLERGIES 01/18/14   Tiffany L Reed, DO  fluticasone-salmeterol (ADVAIR HFA) 230-21 MCG/ACT inhaler Inhale 2 puffs into the lungs 2 (two) times daily. 05/16/15   Javier Glazier, MD  lovastatin (MEVACOR) 40 MG tablet TAKE 1 TABLET EVERY DAY TO LOWER CHLOESTEROL 09/21/13   Tiffany L Reed, DO  lovastatin (MEVACOR) 40 MG tablet APPOINTMENT OVERDUE, 1 by mouth every day to lower cholesterol 05/31/15   Tiffany L Reed, DO  montelukast (SINGULAIR) 10 MG tablet Take 1 tablet (10 mg total) by mouth daily. 05/11/15   Javier Glazier, MD  Multiple Vitamins-Minerals (MULTI FOR HER 50+) CAPS Take by mouth daily.    Historical Provider, MD   Meds Ordered and Administered this Visit  Medications -  No data to display  BP 128/77 mmHg  Pulse 71  Temp(Src) 97.4 F (36.3 C) (Oral)  Resp 16  SpO2 96% No data found.   Physical Exam  Constitutional: She is oriented to person, place, and time. She appears well-developed and well-nourished. No distress.  Neurological: She is alert and oriented to person, place, and time.  Skin: Skin is warm and dry.  Healing well sutures intact, nvt intact.  Nursing note and vitals reviewed.   ED Course  Procedures (including critical care time)  Labs Review Labs Reviewed - No data to display  Imaging Review No results found.   Visual Acuity Review  Right Eye Distance:   Left Eye Distance:   Bilateral Distance:    Right  Eye Near:   Left Eye Near:    Bilateral Near:         MDM   1. Encounter for wound re-check    Wound care and dsg change given.    Billy Fischer, MD 06/28/15 2024

## 2015-06-28 NOTE — ED Provider Notes (Signed)
CSN: [id number]     Arrival date & time 06/28/15  1300 History   First MD Initiated Contact with Patient 06/28/15 1320     Chief Complaint  Patient presents with  . Wound Check   (Consider location/radiation/quality/duration/timing/severity/associated sxs/prior Treatment) HPI  Past Medical History  Diagnosis Date  . Hyperlipidemia   . Asthma   . Anxiety   . Migraine with aura, without mention of intractable migraine without mention of status migrainosus   . Allergic rhinitis    Past Surgical History  Procedure Laterality Date  . Appendectomy  1975  . Tubal ligation Bilateral 1976  . Breast biopsy  1976   Family History  Problem Relation Age of Onset  . Asthma Father   . Asthma Sister   . Heart disease Sister   . Cancer Mother     brain, breast  . Cancer Brother 57  . Heart disease Brother   . Hypertension Brother    Social History  Substance Use Topics  . Smoking status: Never Smoker   . Smokeless tobacco: None  . Alcohol Use: 0.0 oz/week    0 Standard drinks or equivalent per week     Comment: Rare wine - 1 glass/mo   OB History    No data available     Review of Systems  Allergies  Aspirin; Ceftin; and Ciprofloxacin hcl  Home Medications   Prior to Admission medications   Medication Sig Start Date End Date Taking? Authorizing Provider  albuterol (PROAIR HFA) 108 (90 BASE) MCG/ACT inhaler INHALE 2 PUFFS UP TO 4 TIMES A DAY TO HELP BREATHING Patient taking differently: INHALE 2 PUFFS UP TO 4 TIMES A DAY as neededTO HELP BREATHING 01/10/14   Kathee Delton, MD  Biotin 5000 MCG CAPS Take by mouth. W/ vit c and vit d    Historical Provider, MD  cetirizine (ZYRTEC) 10 MG tablet Take one tablet once daily for allergies    Historical Provider, MD  Cholecalciferol (VITAMIN D3) 2000 UNITS TABS Take by mouth daily.    Historical Provider, MD  citalopram (CELEXA) 40 MG tablet TAKE 1 TABLET BY MOUTH ONCE DAILY 03/06/15   Tiffany L Reed, DO  fluticasone (FLONASE) 50  MCG/ACT nasal spray USE 1 SPRAY IN EACH NOSTRIL EVERY DAY AS NEEDED FOR ALLERGIES 01/18/14   Tiffany L Reed, DO  fluticasone-salmeterol (ADVAIR HFA) 230-21 MCG/ACT inhaler Inhale 2 puffs into the lungs 2 (two) times daily. 05/16/15   Javier Glazier, MD  lovastatin (MEVACOR) 40 MG tablet TAKE 1 TABLET EVERY DAY TO LOWER CHLOESTEROL 09/21/13   Tiffany L Reed, DO  lovastatin (MEVACOR) 40 MG tablet APPOINTMENT OVERDUE, 1 by mouth every day to lower cholesterol 05/31/15   Tiffany L Reed, DO  montelukast (SINGULAIR) 10 MG tablet Take 1 tablet (10 mg total) by mouth daily. 05/11/15   Javier Glazier, MD  Multiple Vitamins-Minerals (MULTI FOR HER 50+) CAPS Take by mouth daily.    Historical Provider, MD   Meds Ordered and Administered this Visit  Medications - No data to display  BP 128/77 mmHg  Pulse 71  Temp(Src) 97.4 F (36.3 C) (Oral)  Resp 16  SpO2 96% No data found.   Physical Exam  ED Course  Procedures (including critical care time)  Labs Review Labs Reviewed - No data to display  Imaging Review Dg Hand Complete Left  06/26/2015  CLINICAL DATA:  Laceration of left hand along the outer aspect of the fifth finger. Initial encounter. EXAM: LEFT HAND -  COMPLETE 3+ VIEW COMPARISON:  None. FINDINGS: There is no evidence of acute fracture or dislocation. There is no evidence of arthropathy or other focal bone abnormality. No soft tissue foreign body identified. IMPRESSION: No evidence of acute fracture or soft tissue foreign body. Electronically Signed   By: Aletta Edouard M.D.   On: 06/26/2015 16:11     Visual Acuity Review  Right Eye Distance:   Left Eye Distance:   Bilateral Distance:    Right Eye Near:   Left Eye Near:    Bilateral Near:         MDM  No diagnosis found.     Lysbeth Penner, FNP 06/28/15 2020

## 2015-07-01 ENCOUNTER — Other Ambulatory Visit: Payer: Self-pay | Admitting: Internal Medicine

## 2015-07-05 ENCOUNTER — Emergency Department (INDEPENDENT_AMBULATORY_CARE_PROVIDER_SITE_OTHER)
Admission: EM | Admit: 2015-07-05 | Discharge: 2015-07-05 | Disposition: A | Discharge status: Home / Self Care | Payer: Medicare Other | Source: Home / Self Care | Attending: Family Medicine | Admitting: Family Medicine

## 2015-07-05 ENCOUNTER — Encounter (HOSPITAL_COMMUNITY): Payer: Self-pay | Admitting: *Deleted

## 2015-07-05 DIAGNOSIS — Z4802 Encounter for removal of sutures: Secondary | ICD-10-CM | POA: Diagnosis not present

## 2015-07-05 NOTE — ED Notes (Signed)
Here  For  A  Recheck  Of   l   Small  Finger         Possible     Suture        Removal

## 2015-07-05 NOTE — ED Provider Notes (Signed)
CSN: [id number]     Arrival date & time 07/05/15  1300 History   First MD Initiated Contact with Patient 07/05/15 1321     Chief Complaint  Patient presents with  . Follow-up   (Consider location/radiation/quality/duration/timing/severity/associated sxs/prior Treatment) Patient is a 74 y.o. female presenting with suture removal. The history is provided by the patient.  Suture / Staple Removal This is a new problem. The current episode started more than 1 week ago. The problem has been resolved (healed with no problems.).    Past Medical History  Diagnosis Date  . Hyperlipidemia   . Asthma   . Anxiety   . Migraine with aura, without mention of intractable migraine without mention of status migrainosus   . Allergic rhinitis    Past Surgical History  Procedure Laterality Date  . Appendectomy  1975  . Tubal ligation Bilateral 1976  . Breast biopsy  1976   Family History  Problem Relation Age of Onset  . Asthma Father   . Asthma Sister   . Heart disease Sister   . Cancer Mother     brain, breast  . Cancer Brother 38  . Heart disease Brother   . Hypertension Brother    Social History  Substance Use Topics  . Smoking status: Never Smoker   . Smokeless tobacco: None  . Alcohol Use: 0.0 oz/week    0 Standard drinks or equivalent per week     Comment: Rare wine - 1 glass/mo   OB History    No data available     Review of Systems  Skin: Positive for wound.  All other systems reviewed and are negative.   Allergies  Aspirin; Ceftin; and Ciprofloxacin hcl  Home Medications   Prior to Admission medications   Medication Sig Start Date End Date Taking? Authorizing Provider  albuterol (PROAIR HFA) 108 (90 BASE) MCG/ACT inhaler INHALE 2 PUFFS UP TO 4 TIMES A DAY TO HELP BREATHING Patient taking differently: INHALE 2 PUFFS UP TO 4 TIMES A DAY as neededTO HELP BREATHING 01/10/14   Kathee Delton, MD  Biotin 5000 MCG CAPS Take by mouth. W/ vit c and vit d    Historical  Provider, MD  cetirizine (ZYRTEC) 10 MG tablet Take one tablet once daily for allergies    Historical Provider, MD  Cholecalciferol (VITAMIN D3) 2000 UNITS TABS Take by mouth daily.    Historical Provider, MD  citalopram (CELEXA) 40 MG tablet TAKE 1 TABLET BY MOUTH ONCE DAILY 03/06/15   Tiffany L Reed, DO  fluticasone (FLONASE) 50 MCG/ACT nasal spray USE 1 SPRAY IN EACH NOSTRIL EVERY DAY AS NEEDED FOR ALLERGIES 01/18/14   Tiffany L Reed, DO  fluticasone-salmeterol (ADVAIR HFA) 230-21 MCG/ACT inhaler Inhale 2 puffs into the lungs 2 (two) times daily. 05/16/15   Javier Glazier, MD  lovastatin (MEVACOR) 40 MG tablet TAKE 1 TABLET EVERY DAY TO LOWER CHLOESTEROL 09/21/13   Tiffany L Reed, DO  lovastatin (MEVACOR) 40 MG tablet TAKE 1 TABLET EVERY DAY TO LOWER CHLOESTEROL *NEEDS OFFICE VISIT* 07/03/15   Tiffany L Reed, DO  montelukast (SINGULAIR) 10 MG tablet Take 1 tablet (10 mg total) by mouth daily. 05/11/15   Javier Glazier, MD  Multiple Vitamins-Minerals (MULTI FOR HER 50+) CAPS Take by mouth daily.    Historical Provider, MD   Meds Ordered and Administered this Visit  Medications - No data to display  BP 160/97 mmHg  Pulse 76  Temp(Src) 97.9 F (36.6 C) (Oral)  Resp  16  SpO2 98% No data found.   Physical Exam  Constitutional: She is oriented to person, place, and time. She appears well-developed and well-nourished. No distress.  Neurological: She is alert and oriented to person, place, and time.  Skin: Skin is warm and dry.  Left 5th finger lac well healed sutures removed.  Nursing note and vitals reviewed.   ED Course  Procedures (including critical care time)  Labs Review Labs Reviewed - No data to display  Imaging Review No results found.   Visual Acuity Review  Right Eye Distance:   Left Eye Distance:   Bilateral Distance:    Right Eye Near:   Left Eye Near:    Bilateral Near:         MDM   1. Encounter for removal of sutures        Billy Fischer,  MD 07/05/15 1408

## 2015-07-05 NOTE — Discharge Instructions (Signed)
Return as needed

## 2015-07-11 ENCOUNTER — Other Ambulatory Visit: Payer: Medicare Other

## 2015-07-11 DIAGNOSIS — R739 Hyperglycemia, unspecified: Secondary | ICD-10-CM | POA: Diagnosis not present

## 2015-07-12 LAB — CBC WITH DIFFERENTIAL
Basophils Absolute: 0.1 10*3/uL (ref 0.0–0.2)
Basos: 1 %
EOS (ABSOLUTE): 0.6 10*3/uL — ABNORMAL HIGH (ref 0.0–0.4)
Eos: 9 %
Hematocrit: 40.4 % (ref 34.0–46.6)
Hemoglobin: 13.7 g/dL (ref 11.1–15.9)
Immature Grans (Abs): 0 10*3/uL (ref 0.0–0.1)
Immature Granulocytes: 0 %
Lymphocytes Absolute: 2.2 10*3/uL (ref 0.7–3.1)
Lymphs: 31 %
MCH: 31 pg (ref 26.6–33.0)
MCHC: 33.9 g/dL (ref 31.5–35.7)
MCV: 91 fL (ref 79–97)
Monocytes Absolute: 0.7 10*3/uL (ref 0.1–0.9)
Monocytes: 9 %
Neutrophils Absolute: 3.5 10*3/uL (ref 1.4–7.0)
Neutrophils: 50 %
RBC: 4.42 x10E6/uL (ref 3.77–5.28)
RDW: 13.8 % (ref 12.3–15.4)
WBC: 7 10*3/uL (ref 3.4–10.8)

## 2015-07-12 LAB — COMPREHENSIVE METABOLIC PANEL
ALT: 17 IU/L (ref 0–32)
AST: 23 IU/L (ref 0–40)
Albumin/Globulin Ratio: 1.8 (ref 1.1–2.5)
Albumin: 4.3 g/dL (ref 3.5–4.8)
Alkaline Phosphatase: 73 IU/L (ref 39–117)
BUN/Creatinine Ratio: 18 (ref 11–26)
BUN: 13 mg/dL (ref 8–27)
Bilirubin Total: 0.4 mg/dL (ref 0.0–1.2)
CO2: 23 mmol/L (ref 18–29)
Calcium: 9.6 mg/dL (ref 8.7–10.3)
Chloride: 102 mmol/L (ref 97–106)
Creatinine, Ser: 0.72 mg/dL (ref 0.57–1.00)
GFR calc Af Amer: 94 mL/min/{1.73_m2} (ref >59)
GFR calc non Af Amer: 82 mL/min/{1.73_m2} (ref >59)
Globulin, Total: 2.4 g/dL (ref 1.5–4.5)
Glucose: 96 mg/dL (ref 65–99)
Potassium: 4.4 mmol/L (ref 3.5–5.2)
Sodium: 140 mmol/L (ref 136–144)
Total Protein: 6.7 g/dL (ref 6.0–8.5)

## 2015-07-12 LAB — LIPID PANEL
Chol/HDL Ratio: 3.4 ratio units (ref 0.0–4.4)
Cholesterol, Total: 193 mg/dL (ref 100–199)
HDL: 57 mg/dL (ref >39)
LDL Calculated: 103 mg/dL — ABNORMAL HIGH (ref 0–99)
Triglycerides: 163 mg/dL — ABNORMAL HIGH (ref 0–149)
VLDL Cholesterol Cal: 33 mg/dL (ref 5–40)

## 2015-07-12 LAB — HEMOGLOBIN A1C
Est. average glucose Bld gHb Est-mCnc: 134 mg/dL
Hgb A1c MFr Bld: 6.3 % — ABNORMAL HIGH (ref 4.8–5.6)

## 2015-07-13 ENCOUNTER — Ambulatory Visit (INDEPENDENT_AMBULATORY_CARE_PROVIDER_SITE_OTHER): Payer: Medicare Other | Admitting: Internal Medicine

## 2015-07-13 ENCOUNTER — Encounter: Payer: Self-pay | Admitting: Internal Medicine

## 2015-07-13 VITALS — BP 136/72 | HR 93 | Temp 98.0°F | Resp 18 | Ht 62.0 in | Wt 136.8 lb

## 2015-07-13 DIAGNOSIS — I10 Essential (primary) hypertension: Secondary | ICD-10-CM

## 2015-07-13 DIAGNOSIS — S61219D Laceration without foreign body of unspecified finger without damage to nail, subsequent encounter: Secondary | ICD-10-CM | POA: Diagnosis not present

## 2015-07-13 DIAGNOSIS — M810 Age-related osteoporosis without current pathological fracture: Secondary | ICD-10-CM | POA: Diagnosis not present

## 2015-07-13 DIAGNOSIS — R739 Hyperglycemia, unspecified: Secondary | ICD-10-CM | POA: Diagnosis not present

## 2015-07-13 DIAGNOSIS — W19XXXD Unspecified fall, subsequent encounter: Secondary | ICD-10-CM

## 2015-07-13 DIAGNOSIS — J4520 Mild intermittent asthma, uncomplicated: Secondary | ICD-10-CM

## 2015-07-13 DIAGNOSIS — G3184 Mild cognitive impairment, so stated: Secondary | ICD-10-CM

## 2015-07-13 DIAGNOSIS — E785 Hyperlipidemia, unspecified: Secondary | ICD-10-CM

## 2015-07-13 NOTE — Progress Notes (Signed)
Patient ID: Melinda Griffin, female   DOB: Oct 01, 1940, 74 y.o.   MRN: [id number]   Location: [place] Provider: Rexene Edison. Mariea Clonts, D.O., C.M.D.  Goals of Care: Advanced Directive information Does patient have an advance directive?: Yes, Type of Advance Directive: [place];Living will, Does patient want to make changes to advanced directive?: No - Patient declined Daughter, [name] Liccionne; Abbe Amsterdam, her husband is second Risk analyst Complaint  Patient presents with  . Medical Management of Chronic Issues    6 mo f/u - labs , check cut finger on left little finger    HPI: Patient is a 74 y.o. female seen in the office today for med mgt of chronic diseases.  She went to open the sliding door of the storage building.  She started to fall and stuck her arms out.  Fell on her belly and blood was gushing out of her left arm.  Kept bleeding at urgent care at [facility].  Tried to stop bleeding.  Skin was laid back away from her lower palm.  xrays w/o fracture.  Dr. Delilah Shan was calming her down and she had excruciating pain.  Had 15 stiches in 5th finger of left hand.  Had small cut on 4th finger also.  Got stitches out last week which hurt more.  Had it wrapped, but now has been washing it with natural soap and putting alcohol on it.  It turns out a metal piece on a storage bin for some wood was sticking up and was the cause.    Other than that, her husband finished XRT for prostate ca 3 wks before her fall.   He's now on lupron. Was 40 days of early am treatments.  Admits she was eating cookies out and about, but never bought at home.    Cholesterol:  LDL trended up along with TG.  Was not exercising and eating well like she had and she's now back to her routine.    Hyperglycemia:  Sugar average also trended up from cookies and decreased exercise.  CMP was normal.  CBC normal also.  Last bone density was 2013.  Takes vitamin D 2000 units daily.  Agrees to get rechecked.    Review of  Systems:  Review of Systems  Constitutional: Negative for fever and chills.  HENT: Negative for congestion and hearing loss.   Eyes: Negative for blurred vision.  Respiratory: Negative for shortness of breath.   Cardiovascular: Negative for chest pain and leg swelling.  Gastrointestinal: Negative for abdominal pain.  Genitourinary: Negative for dysuria.  Musculoskeletal: Positive for falls.  Skin: Negative for rash.       Laceration of left fifth digit is healing  Neurological: Negative for dizziness, loss of consciousness, weakness and headaches.  Endo/Heme/Allergies: Does not bruise/bleed easily.  Psychiatric/Behavioral: Positive for memory loss. Negative for depression. The patient is not nervous/anxious and does not have insomnia.     Past Medical History  Diagnosis Date  . Hyperlipidemia   . Asthma   . Anxiety   . Migraine with aura, without mention of intractable migraine without mention of status migrainosus   . Allergic rhinitis     Past Surgical History  Procedure Laterality Date  . Appendectomy  1975  . Tubal ligation Bilateral 1976  . Breast biopsy  1976    Allergies  Allergen Reactions  . Aspirin     Advised by MD previously not to take  . Ceftin     Dizzy & Diarrhea  .  Ciprofloxacin Hcl     Dizzy, diarrhea      Medication List       This list is accurate as of: 07/13/15  4:00 PM.  Always use your most recent med list.               albuterol 108 (90 BASE) MCG/ACT inhaler  Commonly known as:  PROAIR HFA  INHALE 2 PUFFS UP TO 4 TIMES A DAY TO HELP BREATHING     Biotin 5000 MCG Caps  Take by mouth. W/ vit c and vit d     cetirizine 10 MG tablet  Commonly known as:  ZYRTEC  Take one tablet once daily for allergies     citalopram 40 MG tablet  Commonly known as:  CELEXA  TAKE 1 TABLET BY MOUTH ONCE DAILY     fluticasone 50 MCG/ACT nasal spray  Commonly known as:  FLONASE  USE 1 SPRAY IN EACH NOSTRIL EVERY DAY AS NEEDED FOR ALLERGIES      fluticasone-salmeterol 230-21 MCG/ACT inhaler  Commonly known as:  ADVAIR HFA  Inhale 2 puffs into the lungs 2 (two) times daily.     lovastatin 40 MG tablet  Commonly known as:  MEVACOR  TAKE 1 TABLET EVERY DAY TO LOWER CHLOESTEROL     montelukast 10 MG tablet  Commonly known as:  SINGULAIR  Take 1 tablet (10 mg total) by mouth daily.     MULTI FOR HER 50+ Caps  Take by mouth daily.     Vitamin D3 2000 UNITS Tabs  Take by mouth daily.        Health Maintenance  Topic Date Due  . INFLUENZA VACCINE  03/26/2016  . TETANUS/TDAP  07/20/2023  . DEXA SCAN  Completed  . ZOSTAVAX  Completed  . PNA vac Low Risk Adult  Completed    Physical Exam: Filed Vitals:   07/13/15 1519  BP: 136/72  Pulse: 93  Temp: 98 F (36.7 C)  TempSrc: Oral  Resp: 18  Height: 5\' 2"  (1.575 m)  Weight: 136 lb 12.8 oz (62.052 kg)  SpO2: 94%   Body mass index is 25.01 kg/(m^2). Physical Exam  Constitutional: She is oriented to person, place, and time. She appears well-developed and well-nourished. No distress.  Cardiovascular: Normal rate, regular rhythm, normal heart sounds and intact distal pulses.   Pulmonary/Chest: Effort normal and breath sounds normal.  Abdominal: Soft. Bowel sounds are normal. She exhibits no distension and no mass.  Musculoskeletal: Normal range of motion.  Neurological: She is alert and oriented to person, place, and time.  Skin: Skin is warm and dry.  Dry scaly skin of fifth digit of left hand at site of laceration, sensation intact  Psychiatric: She has a normal mood and affect.    Labs reviewed: Basic Metabolic Panel:  Recent Labs  07/11/15 0947  NA 140  K 4.4  CL 102  CO2 23  GLUCOSE 96  BUN 13  CREATININE 0.72  CALCIUM 9.6   Liver Function Tests:  Recent Labs  07/11/15 0947  AST 23  ALT 17  ALKPHOS 73  BILITOT 0.4  PROT 6.7  ALBUMIN 4.3   No results for input(s): LIPASE, AMYLASE in the last 8760 hours. No results for input(s): AMMONIA in  the last 8760 hours. CBC:  Recent Labs  07/11/15 0947  WBC 7.0  NEUTROABS 3.5  HCT 40.4   Lipid Panel:  Recent Labs  07/11/15 0947  CHOL 193  HDL 57  LDLCALC 103*  TRIG 163*  CHOLHDL 3.4   Lab Results  Component Value Date   HGBA1C 6.3* 07/11/2015    Procedures since last visit: Reviewed urgent care records from fall  Assessment/Plan 1. Senile osteoporosis - only on vitamin D and has been slack on weightbearing exercise, now getting back to it - recheck bone density--will consider prolia or alternative if worsened - DG Bone Density; Future  2. Fall, subsequent encounter -mechanical -with finger injury -normally not a high fall risk  3. Finger laceration, subsequent encounter -s/p 14 sutures -is slowly healing -is up to date on tdap  4. Essential hypertension -bp at goal w/o meds  5. Asthma, chronic, mild intermittent, uncomplicated -cont singulair during proper season and proair prn  6. Mild cognitive impairment with memory loss -has been stable for years since benzodiazepines stopped  7. Hyperglycemia -is getting back on her diet and exercise as hba1c trended upward  8. Hyperlipidemia -lipids have bumped up since her husband has been ill  Labs/tests ordered:  Orders Placed This Encounter  Procedures  . DG Bone Density    Standing Status: Future     Number of Occurrences:      Standing Expiration Date: 09/11/2016    Order Specific Question:  Reason for Exam (SYMPTOM  OR DIAGNOSIS REQUIRED)    Answer:  senile osteoporosis; postmenopausal estrogen deficiency    Order Specific Question:  Preferred imaging location?    Answer:  [facility]  . CBC with Differential/Platelet    Standing Status: Future     Number of Occurrences:      Standing Expiration Date: 01/10/2016  . Comprehensive metabolic panel    Standing Status: Future     Number of Occurrences:      Standing Expiration Date: 01/10/2016    Order Specific Question:  Has the patient  fasted?    Answer:  Yes  . Hemoglobin A1c    Standing Status: Future     Number of Occurrences:      Standing Expiration Date: 01/10/2016  . Lipid panel    Standing Status: Future     Number of Occurrences:      Standing Expiration Date: 01/10/2016    Order Specific Question:  Has the patient fasted?    Answer:  Yes    Next appt:  3 mos with labs before  Baldomero Mirarchi L. Mahari Vankirk, D.O. [facility] [address], [place] [address] Cell Phone (Mon-Fri 8am-5pm):  [phone] On Call:  [phone] & follow prompts after 5pm & weekends Office Phone:  [phone] Office Fax:  [phone]

## 2015-07-13 NOTE — Patient Instructions (Signed)
Please bring [place] a copy of your hcpoa/living will.

## 2015-07-25 ENCOUNTER — Other Ambulatory Visit: Payer: Self-pay | Admitting: *Deleted

## 2015-07-25 MED ORDER — ALBUTEROL SULFATE HFA 108 (90 BASE) MCG/ACT IN AERS: 2.0000 | INHALATION_SPRAY | Freq: Four times a day (QID) | RESPIRATORY_TRACT | Status: DC | PRN

## 2015-07-29 ENCOUNTER — Other Ambulatory Visit: Payer: Self-pay | Admitting: Internal Medicine

## 2015-08-11 DIAGNOSIS — S64497A Injury of digital nerve of left little finger, initial encounter: Secondary | ICD-10-CM | POA: Diagnosis not present

## 2015-08-23 DIAGNOSIS — M25642 Stiffness of left hand, not elsewhere classified: Secondary | ICD-10-CM | POA: Diagnosis not present

## 2015-08-23 DIAGNOSIS — R201 Hypoesthesia of skin: Secondary | ICD-10-CM | POA: Diagnosis not present

## 2015-08-23 DIAGNOSIS — S64497A Injury of digital nerve of left little finger, initial encounter: Secondary | ICD-10-CM | POA: Diagnosis not present

## 2015-08-26 ENCOUNTER — Other Ambulatory Visit: Payer: Self-pay | Admitting: Internal Medicine

## 2015-08-29 ENCOUNTER — Inpatient Hospital Stay: Admission: RE | Admit: 2015-08-29 | Payer: Medicare Other | Source: Ambulatory Visit

## 2015-08-31 DIAGNOSIS — S64497A Injury of digital nerve of left little finger, initial encounter: Secondary | ICD-10-CM | POA: Diagnosis not present

## 2015-08-31 DIAGNOSIS — R201 Hypoesthesia of skin: Secondary | ICD-10-CM | POA: Diagnosis not present

## 2015-08-31 DIAGNOSIS — M25642 Stiffness of left hand, not elsewhere classified: Secondary | ICD-10-CM | POA: Diagnosis not present

## 2015-09-06 ENCOUNTER — Encounter: Payer: Self-pay | Admitting: Internal Medicine

## 2015-09-07 DIAGNOSIS — R201 Hypoesthesia of skin: Secondary | ICD-10-CM | POA: Diagnosis not present

## 2015-09-07 DIAGNOSIS — S64497A Injury of digital nerve of left little finger, initial encounter: Secondary | ICD-10-CM | POA: Diagnosis not present

## 2015-09-07 DIAGNOSIS — M25642 Stiffness of left hand, not elsewhere classified: Secondary | ICD-10-CM | POA: Diagnosis not present

## 2015-09-13 DIAGNOSIS — S64497A Injury of digital nerve of left little finger, initial encounter: Secondary | ICD-10-CM | POA: Diagnosis not present

## 2015-09-20 DIAGNOSIS — M25642 Stiffness of left hand, not elsewhere classified: Secondary | ICD-10-CM | POA: Diagnosis not present

## 2015-09-20 DIAGNOSIS — S64497A Injury of digital nerve of left little finger, initial encounter: Secondary | ICD-10-CM | POA: Diagnosis not present

## 2015-09-20 DIAGNOSIS — R201 Hypoesthesia of skin: Secondary | ICD-10-CM | POA: Diagnosis not present

## 2015-09-27 ENCOUNTER — Ambulatory Visit
Admission: RE | Admit: 2015-09-27 | Discharge: 2015-09-27 | Disposition: A | Discharge status: Home / Self Care | Payer: Medicare Other | Source: Ambulatory Visit | Attending: Internal Medicine | Admitting: Internal Medicine

## 2015-09-27 DIAGNOSIS — M8589 Other specified disorders of bone density and structure, multiple sites: Secondary | ICD-10-CM | POA: Diagnosis not present

## 2015-09-27 DIAGNOSIS — M810 Age-related osteoporosis without current pathological fracture: Secondary | ICD-10-CM

## 2015-10-04 ENCOUNTER — Other Ambulatory Visit: Payer: Self-pay | Admitting: *Deleted

## 2015-10-04 MED ORDER — LOVASTATIN 40 MG PO TABS: ORAL_TABLET | ORAL | Status: DC

## 2015-10-04 NOTE — Telephone Encounter (Signed)
[facility] 

## 2015-10-12 DIAGNOSIS — S61217D Laceration without foreign body of left little finger without damage to nail, subsequent encounter: Secondary | ICD-10-CM | POA: Diagnosis not present

## 2015-10-17 ENCOUNTER — Other Ambulatory Visit: Payer: Medicare Other

## 2015-10-18 ENCOUNTER — Other Ambulatory Visit: Payer: Medicare Other

## 2015-10-18 DIAGNOSIS — E785 Hyperlipidemia, unspecified: Secondary | ICD-10-CM | POA: Diagnosis not present

## 2015-10-18 DIAGNOSIS — R739 Hyperglycemia, unspecified: Secondary | ICD-10-CM

## 2015-10-18 DIAGNOSIS — I10 Essential (primary) hypertension: Secondary | ICD-10-CM | POA: Diagnosis not present

## 2015-10-19 ENCOUNTER — Encounter: Payer: Self-pay | Admitting: Internal Medicine

## 2015-10-19 ENCOUNTER — Ambulatory Visit (INDEPENDENT_AMBULATORY_CARE_PROVIDER_SITE_OTHER): Payer: Medicare Other | Admitting: Internal Medicine

## 2015-10-19 VITALS — BP 134/60 | HR 92 | Temp 98.0°F | Resp 20 | Ht 62.0 in | Wt 135.8 lb

## 2015-10-19 DIAGNOSIS — S61219S Laceration without foreign body of unspecified finger without damage to nail, sequela: Secondary | ICD-10-CM | POA: Diagnosis not present

## 2015-10-19 DIAGNOSIS — E785 Hyperlipidemia, unspecified: Secondary | ICD-10-CM | POA: Diagnosis not present

## 2015-10-19 DIAGNOSIS — J4520 Mild intermittent asthma, uncomplicated: Secondary | ICD-10-CM | POA: Diagnosis not present

## 2015-10-19 DIAGNOSIS — G3184 Mild cognitive impairment, so stated: Secondary | ICD-10-CM

## 2015-10-19 DIAGNOSIS — R739 Hyperglycemia, unspecified: Secondary | ICD-10-CM

## 2015-10-19 DIAGNOSIS — I10 Essential (primary) hypertension: Secondary | ICD-10-CM | POA: Diagnosis not present

## 2015-10-19 DIAGNOSIS — M810 Age-related osteoporosis without current pathological fracture: Secondary | ICD-10-CM

## 2015-10-19 LAB — COMPREHENSIVE METABOLIC PANEL
ALT: 21 IU/L (ref 0–32)
AST: 21 IU/L (ref 0–40)
Albumin/Globulin Ratio: 1.8 (ref 1.1–2.5)
Albumin: 4.3 g/dL (ref 3.5–4.8)
Alkaline Phosphatase: 70 IU/L (ref 39–117)
BUN/Creatinine Ratio: 17 (ref 11–26)
BUN: 11 mg/dL (ref 8–27)
Bilirubin Total: 0.3 mg/dL (ref 0.0–1.2)
CO2: 25 mmol/L (ref 18–29)
Calcium: 9.5 mg/dL (ref 8.7–10.3)
Chloride: 101 mmol/L (ref 96–106)
Creatinine, Ser: 0.63 mg/dL (ref 0.57–1.00)
GFR calc Af Amer: 101 mL/min/{1.73_m2} (ref >59)
GFR calc non Af Amer: 87 mL/min/{1.73_m2} (ref >59)
Globulin, Total: 2.4 g/dL (ref 1.5–4.5)
Glucose: 85 mg/dL (ref 65–99)
Potassium: 4.4 mmol/L (ref 3.5–5.2)
Sodium: 142 mmol/L (ref 134–144)
Total Protein: 6.7 g/dL (ref 6.0–8.5)

## 2015-10-19 LAB — CBC WITH DIFFERENTIAL/PLATELET
Basophils Absolute: 0.1 10*3/uL (ref 0.0–0.2)
Basos: 1 %
EOS (ABSOLUTE): 0.5 10*3/uL — ABNORMAL HIGH (ref 0.0–0.4)
Eos: 7 %
Hematocrit: 41.3 % (ref 34.0–46.6)
Hemoglobin: 14.1 g/dL (ref 11.1–15.9)
Immature Grans (Abs): 0 10*3/uL (ref 0.0–0.1)
Immature Granulocytes: 0 %
Lymphocytes Absolute: 1.9 10*3/uL (ref 0.7–3.1)
Lymphs: 29 %
MCH: 31.3 pg (ref 26.6–33.0)
MCHC: 34.1 g/dL (ref 31.5–35.7)
MCV: 92 fL (ref 79–97)
Monocytes Absolute: 0.6 10*3/uL (ref 0.1–0.9)
Monocytes: 8 %
Neutrophils Absolute: 3.6 10*3/uL (ref 1.4–7.0)
Neutrophils: 55 %
Platelets: 293 10*3/uL (ref 150–379)
RBC: 4.51 x10E6/uL (ref 3.77–5.28)
RDW: 13.7 % (ref 12.3–15.4)
WBC: 6.5 10*3/uL (ref 3.4–10.8)

## 2015-10-19 LAB — LIPID PANEL
Chol/HDL Ratio: 3.5 ratio units (ref 0.0–4.4)
Cholesterol, Total: 198 mg/dL (ref 100–199)
HDL: 56 mg/dL (ref >39)
LDL Calculated: 97 mg/dL (ref 0–99)
Triglycerides: 226 mg/dL — ABNORMAL HIGH (ref 0–149)
VLDL Cholesterol Cal: 45 mg/dL — ABNORMAL HIGH (ref 5–40)

## 2015-10-19 LAB — HEMOGLOBIN A1C
Est. average glucose Bld gHb Est-mCnc: 126 mg/dL
Hgb A1c MFr Bld: 6 % — ABNORMAL HIGH (ref 4.8–5.6)

## 2015-10-19 MED ORDER — ALENDRONATE SODIUM 70 MG PO TABS: 70.0000 mg | ORAL_TABLET | ORAL | Status: DC

## 2015-10-19 NOTE — Patient Instructions (Addendum)
Try to cut back on your bread intake and continue with decreased sweets intake.    Osteoporosis Osteoporosis is the thinning and loss of density in the bones. Osteoporosis makes the bones more brittle, fragile, and likely to break (fracture). Over time, osteoporosis can cause the bones to become so weak that they fracture after a simple fall. The bones most likely to fracture are the bones in the hip, wrist, and spine. CAUSES  The exact cause is not known. RISK FACTORS Anyone can develop osteoporosis. You may be at greater risk if you have a family history of the condition or have poor nutrition. You may also have a higher risk if you are:   Female.   77 years old or older.  A smoker.  Not physically active.   White or Asian.  Slender. SIGNS AND SYMPTOMS  A fracture might be the first sign of the disease, especially if it results from a fall or injury that would not usually cause a bone to break. Other signs and symptoms include:   Low back and neck pain.  Stooped posture.  Height loss. DIAGNOSIS  To make a diagnosis, your health care provider may:  Take a medical history.  Perform a physical exam.  Order tests, such as:  A bone mineral density test.  A dual-energy X-ray absorptiometry test. TREATMENT  The goal of osteoporosis treatment is to strengthen your bones to reduce your risk of a fracture. Treatment may involve:  Making lifestyle changes, such as:  Eating a diet rich in calcium.  Doing weight-bearing and muscle-strengthening exercises.  Stopping tobacco use.  Limiting alcohol intake.  Taking medicine to slow the process of bone loss or to increase bone density.  Monitoring your levels of calcium and vitamin D. HOME CARE INSTRUCTIONS  Include calcium and vitamin D in your diet. Calcium is important for bone health, and vitamin D helps the body absorb calcium.  Perform weight-bearing and muscle-strengthening exercises as directed by your health  care provider.  Do not use any tobacco products, including cigarettes, chewing tobacco, and electronic cigarettes. If you need help quitting, ask your health care provider.  Limit your alcohol intake.  Take medicines only as directed by your health care provider.  Keep all follow-up visits as directed by your health care provider. This is important.  Take precautions at home to lower your risk of falling, such as:  Keeping rooms well lit and clutter free.  Installing safety rails on stairs.  Using rubber mats in the bathroom and other areas that are often wet or slippery. SEEK IMMEDIATE MEDICAL CARE IF:  You fall or injure yourself.    This information is not intended to replace advice given to you by your health care provider. Make sure you discuss any questions you have with your health care provider.   Document Released: 05/22/2005 Document Revised: 09/02/2014 Document Reviewed: 01/20/2014 Elsevier Interactive Patient Education [employer].

## 2015-10-19 NOTE — Progress Notes (Signed)
Patient ID: Melinda Griffin, female   DOB: 10/15/1940, 75 y.o.   MRN: [id number]  [facility] clinic Provider: Khira Cudmore L. Mariea Clonts, D.O., C.M.D.  Code Status: DNR Goals of Care:  Advanced Directives 10/19/2015  Does patient have an advance directive? Yes  Type of Advance Directive -  Does patient want to make changes to advanced directive? -  Copy of advanced directive(s) in chart? No - copy requested   Chief Complaint  Patient presents with  . Medical Management of Chronic Issues    3 mo f/u    HPI: Patient is a 75 y.o. female seen today for medical management of chronic diseases.    Finger recovery:  Had been in PT for six weeks every wed.  Saw Dr. Fredna Dow three times.  Did exercises and was making good progress.  At the beginning, Dr. Fredna Dow wanted to consider things to do for nerve damage, but it had gotten much better and she didn't seem to have any problem from it with nerve damage.  Finger is crooked, but is able to do all of her activities.  Is stiff in the mornings, but gets better as she does her exercise.  She has full sensation of the finger, no pain, but a little sensitive.    No more falls since then.  Saw Dr. Ashok Cordia re: her asthma and advair was reduced to 1 twice daily during off season.  Needs repeat PFTs next month.  Uses albuterol about once a day.  Anxiety:  Is learning to cope with her nerves and takes her celexa w/o problems. Grandchildren are stressors b/c they are disabled.  Her husband is getting lupron injections.  Had XRT.    Depression scale negative.    Exercise:  Has not done a lot of exercise over the winter--does use treadmill some.  Is trying to work back up to 1.5 hrs.  Has lost a few lbs.  Walks every morning and some outside with nicer weather lately.  Diet:  Is working on cutting down on sweets (almost cut out) but does love bread/starchy and has had trouble reducing that.    Osteoporosis:  Takes her vitamin D 3000 units daily, does weightbearing exercise.   Eats milk products.  Discussed starting fosamax weekly.    Past Medical History  Diagnosis Date  . Hyperlipidemia   . Asthma   . Anxiety   . Migraine with aura, without mention of intractable migraine without mention of status migrainosus   . Allergic rhinitis     Past Surgical History  Procedure Laterality Date  . Appendectomy  1975  . Tubal ligation Bilateral 1976  . Breast biopsy  1976    Allergies  Allergen Reactions  . Aspirin     Advised by MD previously not to take  . Ceftin     Dizzy & Diarrhea  . Ciprofloxacin Hcl     Dizzy, diarrhea      Medication List       This list is accurate as of: 10/19/15 11:44 AM.  Always use your most recent med list.               albuterol 108 (90 Base) MCG/ACT inhaler  Commonly known as:  PROAIR HFA  Inhale 2 puffs into the lungs every 6 (six) hours as needed for wheezing or shortness of breath.     Biotin 5000 MCG Caps  Take by mouth. W/ vit c and vit d     cetirizine 10 MG tablet  Commonly  known as:  ZYRTEC  Take one tablet once daily for allergies     citalopram 40 MG tablet  Commonly known as:  CELEXA  TAKE 1 TABLET BY MOUTH ONCE DAILY     fluticasone 50 MCG/ACT nasal spray  Commonly known as:  FLONASE  USE 1 SPRAY IN EACH NOSTRIL EVERY DAY AS NEEDED FOR ALLERGIES     fluticasone-salmeterol 230-21 MCG/ACT inhaler  Commonly known as:  ADVAIR HFA  Inhale 2 puffs into the lungs 2 (two) times daily.     lovastatin 40 MG tablet  Commonly known as:  MEVACOR  Take one tablet by mouth once daily to lower cholesterol     montelukast 10 MG tablet  Commonly known as:  SINGULAIR  Take 1 tablet (10 mg total) by mouth daily.     MULTI FOR HER 50+ Caps  Take by mouth daily.     Vitamin D3 2000 units Tabs  Take 3,000 Units by mouth daily.       Review of Systems:  Review of Systems  Constitutional: Negative for activity change, appetite change and unexpected weight change.  HENT: Negative for sinus pressure.     Respiratory: Negative for chest tightness, shortness of breath and wheezing.   Gastrointestinal: Negative for abdominal pain and constipation.  Genitourinary: Negative for difficulty urinating.  Musculoskeletal: Negative for joint swelling.  Neurological: Negative for dizziness and weakness.  Psychiatric/Behavioral: Negative for suicidal ideas and confusion. The patient is nervous/anxious.     Health Maintenance  Topic Date Due  . INFLUENZA VACCINE  03/26/2016  . TETANUS/TDAP  07/20/2023  . DEXA SCAN  Completed  . ZOSTAVAX  Completed  . PNA vac Low Risk Adult  Completed    Physical Exam: Filed Vitals:   10/19/15 1104  BP: 134/60  Pulse: 92  Temp: 98 F (36.7 C)  TempSrc: Oral  Resp: 20  Height: 5\' 2"  (1.575 m)  Weight: 135 lb 12.8 oz (61.598 kg)  SpO2: 95%   Body mass index is 24.83 kg/(m^2). Physical Exam  Constitutional: She is oriented to person, place, and time. She appears well-developed and well-nourished. No distress.  Cardiovascular: Normal rate, regular rhythm, normal heart sounds and intact distal pulses.   Pulmonary/Chest: Effort normal and breath sounds normal. No respiratory distress.  Abdominal: Bowel sounds are normal.  Musculoskeletal: Normal range of motion.  Fifth finger recovering well  Neurological: She is alert and oriented to person, place, and time.  Skin: Skin is warm and dry.  Psychiatric: She has a normal mood and affect.    Labs reviewed: Basic Metabolic Panel:  Recent Labs  07/11/15 0947 10/18/15 1036  NA 140 142  K 4.4 4.4  CL 102 101  CO2 23 25  GLUCOSE 96 85  BUN 13 11  CREATININE 0.72 0.63  CALCIUM 9.6 9.5   Liver Function Tests:  Recent Labs  07/11/15 0947 10/18/15 1036  AST 23 21  ALT 17 21  ALKPHOS 73 70  BILITOT 0.4 0.3  PROT 6.7 6.7  ALBUMIN 4.3 4.3   No results for input(s): LIPASE, AMYLASE in the last 8760 hours. No results for input(s): AMMONIA in the last 8760 hours. CBC:  Recent Labs   07/11/15 0947 10/18/15 1036  WBC 7.0 6.5  NEUTROABS 3.5 3.6  HCT 40.4 41.3  MCV 91 92  PLT  --  293   Lipid Panel:  Recent Labs  07/11/15 0947 10/18/15 1036  CHOL 193 198  HDL 57 56  LDLCALC 103* 97  TRIG 163* 226*  CHOLHDL 3.4 3.5   Lab Results  Component Value Date   HGBA1C 6.0* 10/18/2015    Procedures since last visit: Dg Bone Density  09/27/2015  EXAM: DUAL X-RAY ABSORPTIOMETRY (DXA) FOR BONE MINERAL DENSITY IMPRESSION: Referring Physician:  Hollace Kinnier PATIENT: Name: MAKINA, SHOOTS Patient ID: [id number] Birth Date: 02-Mar-1941 Height: 60.7 in. Sex: Female Measured: 09/27/2015 Weight: 135.0 lbs. Indications: Advanced Age, Caucasian, Celexa, Depression, Estrogen Deficient, Height Loss (781.91), Postmenopausal Fractures: Treatments: Vitamin D (E933.5) ASSESSMENT: The BMD measured at Femur Neck Left is 0.683 g/cm2 with a T-score of -2.6. This patient is considered osteoporotic according to [facility]) criteria. There has been a statistically significant change in BMD of Lumbar spine since prior exam dated 10/21/2011. Site Region Measured Date Measured Age YA BMD Significant CHANGE T-score DualFemur Neck Left 09/27/2015    76.4         -2.6    0.683 g/cm2 AP Spine  L1-L4     09/27/2015    [age]         -1.5    1.010 g/cm2 * World Health Organization [facility]) criteria for post-menopausal, Caucasian Women: Normal       T-score at or above -1 SD Osteopenia   T-score between -1 and -2.5 SD Osteoporosis T-score at or below -2.5 SD RECOMMENDATION: [facility] recommends that FDA-approved medical therapies be considered in postmenopausal women and men age 77 or older with a: 1. Hip or vertebral (clinical or morphometric) fracture. 2. T-score of <-2.5 at the spine or hip. 3. Ten-year fracture probability by FRAX of 3% or greater for hip fracture or 20% or greater for major osteoporotic fracture. All treatment decisions require clinical judgment and  consideration of individual patient factors, including patient preferences, co-morbidities, previous drug use, risk factors not captured in the FRAX model (e.g. falls, vitamin D deficiency, increased bone turnover, interval significant decline in bone density) and possible under - or over-estimation of fracture risk by FRAX. All patients should ensure an adequate intake of dietary calcium (1200 mg/d) and vitamin D (800 IU daily) unless contraindicated. FOLLOW-UP: People with diagnosed cases of osteoporosis or at high risk for fracture should have regular bone mineral density tests. For patients eligible for Medicare, routine testing is allowed once every 2 years. The testing frequency can be increased to one year for patients who have rapidly progressing disease, those who are receiving or discontinuing medical therapy to restore bone mass, or have additional risk factors. I have reviewed this report, and agree with the above findings. [facility] Electronically Signed   By: David  Martinique M.D.   On: 09/27/2015 16:31    Assessment/Plan 1. Asthma, chronic, mild intermittent, uncomplicated - using more advair during seasons when her allergies are worse, less when they are better, continues singulair also and prn albuterol inhaler - CBC with Differential/Platelet; Future  2. Essential hypertension -bp at goal, not on meds  3. Finger laceration, sequela - finger is crooked, but has great sensation and function and she doesn't want anything else done with it--completed therapy and still does exercises  4. Mild cognitive impairment with memory loss -participated in a study on this -she continues to do very well off of benzos  5. Hyperglycemia -cont to work on diet and exercise, but this is much better than last check in Nov last year - Hemoglobin A1c; Future - CBC with Differential/Platelet; Future - Comprehensive metabolic panel; Future  6. Hyperlipidemia - TG elevated--counseled on  dietary  changes to work on this, cont exercise, cont statin therapy - Lipid panel; Future - CBC with Differential/Platelet; Future - Comprehensive metabolic panel; Future  7. Senile osteoporosis - last bone density was just last month which showed osteporosis so started on fosamax -cont vitamin D, calcium rich diet and weightbearing exercise - alendronate (FOSAMAX) 70 MG tablet; Take 1 tablet (70 mg total) by mouth every 7 (seven) days. Take with a full glass of water on an empty stomach.  Dispense: 4 tablet; Refill: 11   Labs/tests ordered:   Orders Placed This Encounter  Procedures  . Lipid panel    Standing Status: Future     Number of Occurrences:      Standing Expiration Date: 10/18/2016    Order Specific Question:  Has the patient fasted?    Answer:  Yes  . Hemoglobin A1c    Standing Status: Future     Number of Occurrences:      Standing Expiration Date: 10/18/2016  . CBC with Differential/Platelet    Standing Status: Future     Number of Occurrences:      Standing Expiration Date: 10/18/2016  . Comprehensive metabolic panel    Standing Status: Future     Number of Occurrences:      Standing Expiration Date: 10/18/2016    Order Specific Question:  Has the patient fasted?    Answer:  Yes   Next appt:  6 mos for annual with labs before   [place]. Willmer Fellers, D.O. [facility] [address], [place] [address] Cell Phone (Mon-Fri 8am-5pm):  [phone] On Call:  [phone] & follow prompts after 5pm & weekends Office Phone:  [phone] Office Fax:  [phone]

## 2015-11-07 ENCOUNTER — Ambulatory Visit (INDEPENDENT_AMBULATORY_CARE_PROVIDER_SITE_OTHER): Payer: Medicare Other | Admitting: Pulmonary Disease

## 2015-11-07 ENCOUNTER — Encounter: Payer: Self-pay | Admitting: Pulmonary Disease

## 2015-11-07 VITALS — BP 122/60 | HR 76 | Ht 61.5 in | Wt 134.0 lb

## 2015-11-07 DIAGNOSIS — J309 Allergic rhinitis, unspecified: Secondary | ICD-10-CM | POA: Diagnosis not present

## 2015-11-07 DIAGNOSIS — J4520 Mild intermittent asthma, uncomplicated: Secondary | ICD-10-CM

## 2015-11-07 LAB — PULMONARY FUNCTION TEST
DL/VA % PRED: 123 %
DL/VA: 5.49 ml/min/mmHg/L
DLCO UNC % PRED: 91 %
DLCO UNC: 19.09 ml/min/mmHg
DLCO cor % pred: 92 %
DLCO cor: 19.4 ml/min/mmHg
FEF 25-75 PRE: 1.38 L/s
FEF 25-75 Post: 1.53 L/sec
FEF2575-%Change-Post: 11 %
FEF2575-%PRED-POST: 106 %
FEF2575-%Pred-Pre: 95 %
FEV1-%CHANGE-POST: 1 %
FEV1-%PRED-POST: 102 %
FEV1-%PRED-PRE: 100 %
FEV1-PRE: 1.83 L
FEV1-Post: 1.86 L
FEV1FVC-%Change-Post: 0 %
FEV1FVC-%Pred-Pre: 100 %
FEV6-%CHANGE-POST: 4 %
FEV6-%PRED-POST: 105 %
FEV6-%PRED-PRE: 100 %
FEV6-POST: 2.44 L
FEV6-PRE: 2.34 L
FEV6FVC-%CHANGE-POST: 2 %
FEV6FVC-%PRED-PRE: 101 %
FEV6FVC-%Pred-Post: 104 %
FVC-%CHANGE-POST: 1 %
FVC-%Pred-Post: 100 %
FVC-%Pred-Pre: 99 %
FVC-Post: 2.47 L
FVC-Pre: 2.43 L
POST FEV6/FVC RATIO: 99 %
PRE FEV6/FVC RATIO: 97 %
Post FEV1/FVC ratio: 75 %
Pre FEV1/FVC ratio: 76 %
RV % PRED: 94 %
RV: 2.09 L
TLC % pred: 93 %
TLC: 4.38 L

## 2015-11-07 NOTE — Progress Notes (Signed)
Subjective:    Patient ID: Melinda Griffin, female    DOB: [birth year], 75 y.o.   MRN: [id number]  C.C.:  Follow-up for Mild, Intermittent Asthma & Allergic Rhinitis.  HPI Mild, Intermittent Asthma: Patient instructed to try using 1 inhalation of her Advair HFA once daily in December. Hasn't used her rescue inhaler in some time. No exacerbations since last appointment. Also compliant with Singulair. She continues to help her husband with yard work. Denies any coughing or wheezing. No nocturnal awakenings.   Allergic Rhinitis:  No sinus congestion, pressure, or drainage. Compliant with Singulair, Zyrtec, & Flonase.  Review of Systems No chest pain or pressure. No fever, chills, or sweats. No reflux or dyspepsia. No abdominal pain.   Allergies  Allergen Reactions  . Aspirin     Advised by MD previously not to take  . Ceftin     Dizzy & Diarrhea  . Ciprofloxacin Hcl     Dizzy, diarrhea   Current Outpatient Prescriptions on File Prior to Visit  Medication Sig Dispense Refill  . albuterol (PROAIR HFA) 108 (90 BASE) MCG/ACT inhaler Inhale 2 puffs into the lungs every 6 (six) hours as needed for wheezing or shortness of breath. 1 Inhaler 3  . Biotin 5000 MCG CAPS Take by mouth. W/ vit c and vit d    . cetirizine (ZYRTEC) 10 MG tablet Take one tablet once daily for allergies    . Cholecalciferol (VITAMIN D3) 2000 UNITS TABS Take 3,000 Units by mouth daily.     . citalopram (CELEXA) 40 MG tablet TAKE 1 TABLET BY MOUTH ONCE DAILY 30 tablet 2  . fluticasone (FLONASE) 50 MCG/ACT nasal spray USE 1 SPRAY IN EACH NOSTRIL EVERY DAY AS NEEDED FOR ALLERGIES 16 g 5  . fluticasone-salmeterol (ADVAIR HFA) 230-21 MCG/ACT inhaler Inhale 2 puffs into the lungs 2 (two) times daily. 1 Inhaler 6  . lovastatin (MEVACOR) 40 MG tablet Take one tablet by mouth once daily to lower cholesterol 30 tablet 1  . montelukast (SINGULAIR) 10 MG tablet Take 1 tablet (10 mg total) by mouth daily. 30 tablet 6  . Multiple  Vitamins-Minerals (MULTI FOR HER 50+) CAPS Take by mouth daily.    Marland Kitchen alendronate (FOSAMAX) 70 MG tablet Take 1 tablet (70 mg total) by mouth every 7 (seven) days. Take with a full glass of water on an empty stomach. (Patient not taking: Reported on 11/07/2015) 4 tablet 11   No current facility-administered medications on file prior to visit.   Past Medical History  Diagnosis Date  . Hyperlipidemia   . Asthma   . Anxiety   . Migraine with aura, without mention of intractable migraine without mention of status migrainosus   . Allergic rhinitis    Past Surgical History  Procedure Laterality Date  . Appendectomy  1975  . Tubal ligation Bilateral 1976  . Breast biopsy  1976   Family History  Problem Relation Age of Onset  . Asthma Father   . Asthma Sister   . Heart disease Sister   . Cancer Mother     brain, breast  . Cancer Brother 14  . Heart disease Brother   . Hypertension Brother    Social History   Social History  . Marital Status: Married    Spouse Name: [name]  . Number of Children: 2  . Years of Education: college    Social History Main Topics  . Smoking status: Never Smoker   . Smokeless tobacco: None  . Alcohol Use:  0.0 oz/week    0 Standard drinks or equivalent per week     Comment: Rare wine - 1 glass/mo  . Drug Use: No  . Sexual Activity: Not Asked   Other Topics Concern  . 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 She previously lived in [place]. Previously has traveled to [place], [place], [place], [place], [place], [place], [place], [place], [place], [place], & [place]. She has traveled all the way up the [facility] to [place]. Hasn't traveled much in the [place] except for [place]. Prior travel to [place] & [place]. She currently has 2 dogs & 1 cat. No bird exposure. Previously worked as a Freight forwarder in [employer]. No mold or hot tub exposure. She does have  a swimming pool & coy pond.       Objective:   Physical Exam BP 122/60 mmHg  Pulse 76  Ht 5' 1.5" (1.562 m)  Wt 134 lb (60.782 kg)  BMI 24.91 kg/m2  SpO2 98% General:  Awake. Alert. Appears comfortable. Integument:  Warm & dry. No rash on exposed skin.  HEENT:  Moist mucus membranes. No oral ulcers. No nasal turbinate swelling. Cardiovascular:  Regular rate. No edema. No appreciable JVD.  Pulmonary:  Clear bilaterally to auscultation. Normal work of breathing on room air. Musculoskeletal: No joint deformity or effusion appreciated. No synovial thickening appreciated.  PFT 11/07/15: FVC 2.43 L (99%) FEV1 1.83 L (100%) FEV1/FVC 0.76 FEF 25-75 1.38 L (95%) no bronchodilator response TLC 4.38 L (93%) RV 94% ERV 122% DLCO corrected 92% (hemoglobin 12.9) 05/09/10: FVC 2.24 L (82%) FEV1 1.64 L (80%) FEV1/FVC 0.97 FEF 25-75 1.18 L (68%) 12/19/00: FVC 3.40 L (122%) FEV1 2.16 L (97%) FEV1/FVC 0.64 FEF 25-75 0.43 L (19%)  LABS 11/25/13 CBC: 7.3/30.9/40.3/? BMP: 143/4.1/102/25/16/0.67/89/9.6 LFT: 4.3/?/0.3/73/20/17    Assessment & Plan:  75 year old female with mild, intermittent asthma as well as allergic rhinitis. Spirometry today shows no significant bronchodilator challenge and no evidence for fixed airway obstruction. Lung volumes and carbon monoxide diffusion capacity are all normal. Patient's asthma seems to be very well-controlled at this time. Completely asymptomatic with regards to her allergic rhinitis. Symptoms seem predominantly occur in the fall. We did discuss further de-escalating the dose of her inhaled steroid medication with the patient is reluctant to do so given her good health. I instructed the patient to contact my office if she had any new breathing problems before next appointment.  1. Mild, intermittent extrinsic asthma: Continuin Singulair & Zyrtec without change. Patient continuing on Advair HFA 1 inhalation once daily. Advised she could increase to one inhalation twice  daily if she developed any breathing problems and contact my office. Plan to repeat spirometry with bronchodilator challenge at next to ensure continued maximal bronchodilatation. 2. Allergic rhinitis:  Well-controlled. Continuing Flonase, Zyrtec, & Singulair. 3. Health maintenance: CVAT influenza vaccine Septel 2015, & Pneumovax September 2008. 4. Follow-up: Patient to return to clinic in 6 months or sooner if needed.  Sonia Baller Ashok Cordia, M.D. [facility] Pager:  [phone] After 3pm or if no response, call [phone] 1:44 PM 11/07/2015

## 2015-11-07 NOTE — Patient Instructions (Signed)
   Continue using your Advair 1 inhalation once daily as you are now. If you start to have any new breathing problems increase to 1 inhalation twice daily and call me.  We will repeat breathing tests at your next appointment.  I will see you back in 6 months or sooner if needed.  TESTS ORDERED: 1. Spirometry with bronchodilator challenge at next appointment

## 2015-11-07 NOTE — Progress Notes (Signed)
PFT done today. 

## 2015-11-25 ENCOUNTER — Other Ambulatory Visit: Payer: Self-pay | Admitting: Internal Medicine

## 2015-12-01 ENCOUNTER — Other Ambulatory Visit: Payer: Self-pay | Admitting: Internal Medicine

## 2016-02-28 DIAGNOSIS — S64497D Injury of digital nerve of left little finger, subsequent encounter: Secondary | ICD-10-CM | POA: Diagnosis not present

## 2016-03-08 DIAGNOSIS — R208 Other disturbances of skin sensation: Secondary | ICD-10-CM | POA: Diagnosis not present

## 2016-03-08 DIAGNOSIS — L570 Actinic keratosis: Secondary | ICD-10-CM | POA: Diagnosis not present

## 2016-03-08 DIAGNOSIS — L821 Other seborrheic keratosis: Secondary | ICD-10-CM | POA: Diagnosis not present

## 2016-03-28 ENCOUNTER — Ambulatory Visit (INDEPENDENT_AMBULATORY_CARE_PROVIDER_SITE_OTHER): Payer: Medicare Other | Admitting: Internal Medicine

## 2016-03-28 ENCOUNTER — Encounter: Payer: Self-pay | Admitting: Internal Medicine

## 2016-03-28 VITALS — BP 120/70 | HR 85 | Temp 97.9°F | Ht 62.0 in | Wt 131.0 lb

## 2016-03-28 DIAGNOSIS — I10 Essential (primary) hypertension: Secondary | ICD-10-CM | POA: Diagnosis not present

## 2016-03-28 DIAGNOSIS — M810 Age-related osteoporosis without current pathological fracture: Secondary | ICD-10-CM

## 2016-03-28 DIAGNOSIS — J45909 Unspecified asthma, uncomplicated: Secondary | ICD-10-CM | POA: Insufficient documentation

## 2016-03-28 DIAGNOSIS — E785 Hyperlipidemia, unspecified: Secondary | ICD-10-CM | POA: Diagnosis not present

## 2016-03-28 DIAGNOSIS — J4520 Mild intermittent asthma, uncomplicated: Secondary | ICD-10-CM | POA: Diagnosis not present

## 2016-03-28 DIAGNOSIS — R739 Hyperglycemia, unspecified: Secondary | ICD-10-CM | POA: Diagnosis not present

## 2016-03-28 DIAGNOSIS — M24542 Contracture, left hand: Secondary | ICD-10-CM

## 2016-03-28 NOTE — Progress Notes (Signed)
Location:  [facility] Provider:  Darneisha Windhorst L. Mariea Clonts, D.O., C.M.D.  Code Status: DNR Goals of Care:  Advanced Directives 03/28/2016  Does patient have an advance directive? No  Type of Advance Directive -  Does patient want to make changes to advanced directive? -  Copy of advanced directive(s) in chart? -  said she has them but not filed here--requested she bring [place] a copy--her daughters have them   Chief Complaint  Patient presents with  . Medical Management of Chronic Issues    68mth follow-up    HPI: Patient is a 75 y.o. [place] seen today for medical management of chronic diseases.    She is in a different stage of her life and having to readjust.  Her husband had XRT and is still getting lupron.  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  Intended to have her own business again. She's an Advertising account executive.  Has traveled for 13 years.  She's never been one to stay at home.  Was still trying to travel/work when her husband had his cancer diagnosis.  Has been trying to retire as of January of this year.  She's just not used to doing the domestic thing.  Doesn't have that money around for fun stuff.  Her husband is developing more medical problems.  She actually had a day where she had no plans for a change.  Is doing girl things with her daughters.  She is going with one of them to [facility], just them.  Going with another daughter to visit her other daughter in [place].  Whole family visiting for her bday 89.    Still seeing memory clinic annually and doing fine.  Saw dermatology last month.  Had looked at her age spots and other places on her skin.  Had a small place on her right hand that he took off.    Mammo next month.    Left pinky didn't get completely straight.  Had only OT for it.  Dr. Fredna Dow said he wasn't sure it would be straight if he did more surgery anyway.    No recent wheezing or dyspnea with her asthma.    Past Medical History:  Diagnosis Date  . Allergic  rhinitis   . Anxiety   . Asthma   . Hyperlipidemia   . Migraine with aura, without mention of intractable migraine without mention of status migrainosus     Past Surgical History:  Procedure Laterality Date  . APPENDECTOMY  1975  . BREAST BIOPSY  1976  . TUBAL LIGATION Bilateral 1976    Allergies  Allergen Reactions  . Aspirin     Advised by MD previously not to take  . Ceftin     Dizzy & Diarrhea  . Ciprofloxacin Hcl     Dizzy, diarrhea      Medication List       Accurate as of 03/28/16  3:05 PM. Always use your most recent med list.          albuterol 108 (90 Base) MCG/ACT inhaler Commonly known as:  PROAIR HFA Inhale 2 puffs into the lungs every 6 (six) hours as needed for wheezing or shortness of breath.   Biotin 5000 MCG Caps Take by mouth. W/ vit c and vit d   cetirizine 10 MG tablet Commonly known as:  ZYRTEC Take one tablet once daily for allergies   citalopram 40 MG tablet Commonly known as:  CELEXA TAKE 1 TABLET BY MOUTH ONCE  DAILY   fluticasone 50 MCG/ACT nasal spray Commonly known as:  FLONASE USE 1 SPRAY IN EACH NOSTRIL EVERY DAY AS NEEDED FOR ALLERGIES   fluticasone-salmeterol 230-21 MCG/ACT inhaler Commonly known as:  ADVAIR HFA Inhale 2 puffs into the lungs 2 (two) times daily.   lovastatin 40 MG tablet Commonly known as:  MEVACOR TAKE 1 TABLET BY MOUTH EVERY DAY TO LOWER CHLOESTEROL   montelukast 10 MG tablet Commonly known as:  SINGULAIR TAKE 1 TABLET (10 MG TOTAL) BY MOUTH DAILY.   MULTI FOR HER 50+ Caps Take by mouth daily.   Vitamin D3 2000 units Tabs Take 3,000 Units by mouth daily.       Review of Systems:  Review of Systems  Constitutional: Positive for weight loss. Negative for chills, fever and malaise/fatigue.  HENT: Negative for congestion and hearing loss.   Eyes: Negative for blurred vision.  Respiratory: Negative for cough and shortness of breath.   Cardiovascular: Negative for chest pain and leg swelling.    Gastrointestinal: Negative for abdominal pain, blood in stool, constipation and melena.  Genitourinary: Negative for dysuria, frequency and urgency.  Musculoskeletal: Negative for falls and myalgias.  Skin: Negative for itching and rash.  Neurological: Negative for dizziness, loss of consciousness, weakness and headaches.  Psychiatric/Behavioral: Negative for depression and memory loss. The patient is nervous/anxious.     Health Maintenance  Topic Date Due  . INFLUENZA VACCINE  03/26/2016  . TETANUS/TDAP  07/20/2023  . DEXA SCAN  Completed  . ZOSTAVAX  Completed  . PNA vac Low Risk Adult  Completed    Physical Exam: Vitals:   03/28/16 1432  BP: 120/70  Pulse: 85  Temp: 97.9 F (36.6 C)  TempSrc: Oral  SpO2: 96%  Weight: 131 lb (59.4 kg)  Height: 5\' 2"  (1.575 m)   Body mass index is 23.96 kg/m. Physical Exam  Constitutional: She is oriented to person, place, and time. She appears well-developed and well-nourished. No distress.  Cardiovascular: Normal rate, regular rhythm, normal heart sounds and intact distal pulses.   Pulmonary/Chest: Effort normal and breath sounds normal. No respiratory distress.  Abdominal: Bowel sounds are normal. She exhibits no distension.  Musculoskeletal: She exhibits deformity. She exhibits no edema or tenderness.  Contracture of left fifth digit s/p fall and sutures  Neurological: She is alert and oriented to person, place, and time. No cranial nerve deficit.  Skin: Skin is warm and dry.    Labs reviewed: Basic Metabolic Panel:  Recent Labs  07/11/15 0947 10/18/15 1036  NA 140 142  K 4.4 4.4  CL 102 101  CO2 23 25  GLUCOSE 96 85  BUN 13 11  CREATININE 0.72 0.63  CALCIUM 9.6 9.5   Liver Function Tests:  Recent Labs  07/11/15 0947 10/18/15 1036  AST 23 21  ALT 17 21  ALKPHOS 73 70  BILITOT 0.4 0.3  PROT 6.7 6.7  ALBUMIN 4.3 4.3   No results for input(s): LIPASE, AMYLASE in the last 8760 hours. No results for input(s):  AMMONIA in the last 8760 hours. CBC:  Recent Labs  07/11/15 0947 10/18/15 1036  WBC 7.0 6.5  NEUTROABS 3.5 3.6  HCT 40.4 41.3  MCV 91 92  PLT  --  293   Lipid Panel:  Recent Labs  07/11/15 0947 10/18/15 1036  CHOL 193 198  HDL 57 56  LDLCALC 103* 97  TRIG 163* 226*  CHOLHDL 3.4 3.5   Lab Results  Component Value Date   HGBA1C  6.0 (H) 10/18/2015    Assessment/Plan 1. Contracture of finger joint, left -ongoing, but she opted not to have further surgery  2. Asthma, chronic, mild intermittent, uncomplicated - stable, summer is a good season for her - CBC with Differential/Platelet; Future  3. Essential hypertension - bp well controlled - CBC with Differential/Platelet; Future - Basic metabolic panel; Future  4. Senile osteoporosis -cont vitamin D and weightbearing exercise, fosamax weekly was ordered at her last visit--oddly, it's not on her med list and I don't see any notes about her calling and saying she could not take it, will need to f/u this  5. Hyperglycemia - diet and exercise regimen improved--see what labs look like - Hemoglobin A1c; Future  6. Hyperlipidemia -has been working on improving her diet since retirement and is walking and swimming at home - Lipid panel; Future  Labs/tests ordered:  Orders Placed This Encounter  Procedures  . CBC with Differential/Platelet    Standing Status:   Future    Standing Expiration Date:   08/23/2016  . Basic metabolic panel    Standing Status:   Future    Standing Expiration Date:   08/23/2016    Order Specific Question:   Has the patient fasted?    Answer:   Yes  . Hemoglobin A1c    Standing Status:   Future    Standing Expiration Date:   08/23/2016  . Lipid panel    Standing Status:   Future    Standing Expiration Date:   08/23/2016    Order Specific Question:   Has the patient fasted?    Answer:   Yes   Next appt:  6 mos annual exam   Saory Carriero L. Pharrell Ledford, D.O. [facility] [address], [place] [address] Cell Phone (Mon-Fri 8am-5pm):  [phone] On Call:  [phone] & follow prompts after 5pm & weekends Office Phone:  [phone] Office Fax:  [phone]

## 2016-04-01 ENCOUNTER — Telehealth: Payer: Self-pay | Admitting: *Deleted

## 2016-04-01 NOTE — Telephone Encounter (Signed)
Spoke with patient to confirm she is no longer taking fosamax, per pt she only took it 1 week because it upset her stomach. Please advise

## 2016-04-01 NOTE — Telephone Encounter (Signed)
Noted.  If she's unable to take orally, I recommend she get the reclast infusion annually for her osteoporosis.

## 2016-04-01 NOTE — Telephone Encounter (Signed)
Patient does not want to do the reclast, is the prolia an option? Or what else can she use?

## 2016-04-01 NOTE — Telephone Encounter (Signed)
Called to speak with patient about this and at this time she doesn't want to talk about this, pt will wait to see Dr. Mariea Clonts to discuss.

## 2016-04-01 NOTE — Telephone Encounter (Signed)
Message left on triage voicemail. Patient is returning call. Please call back

## 2016-04-01 NOTE — Telephone Encounter (Signed)
Could also do prolia injections here in the office twice a year if she is ok with that.

## 2016-04-05 NOTE — Telephone Encounter (Signed)
.  left message to have patient return my call.  

## 2016-04-09 ENCOUNTER — Other Ambulatory Visit: Payer: Medicare Other

## 2016-04-10 ENCOUNTER — Encounter: Payer: Self-pay | Admitting: Internal Medicine

## 2016-04-10 ENCOUNTER — Other Ambulatory Visit: Payer: Medicare Other

## 2016-04-10 DIAGNOSIS — R739 Hyperglycemia, unspecified: Secondary | ICD-10-CM | POA: Diagnosis not present

## 2016-04-10 DIAGNOSIS — I10 Essential (primary) hypertension: Secondary | ICD-10-CM

## 2016-04-10 DIAGNOSIS — J4520 Mild intermittent asthma, uncomplicated: Secondary | ICD-10-CM

## 2016-04-10 DIAGNOSIS — E785 Hyperlipidemia, unspecified: Secondary | ICD-10-CM

## 2016-04-10 LAB — LIPID PANEL
Cholesterol: 161 mg/dL (ref 125–200)
HDL: 60 mg/dL (ref >=46)
LDL Cholesterol: 69 mg/dL (ref <130)
Total CHOL/HDL Ratio: 2.7 Ratio (ref <=5.0)
Triglycerides: 160 mg/dL — ABNORMAL HIGH (ref <150)
VLDL: 32 mg/dL — ABNORMAL HIGH (ref <30)

## 2016-04-10 LAB — CBC WITH DIFFERENTIAL/PLATELET
Basophils Absolute: 66 cells/uL (ref 0–200)
Basophils Relative: 1 %
Eosinophils Absolute: 726 cells/uL — ABNORMAL HIGH (ref 15–500)
Eosinophils Relative: 11 %
HCT: 40.8 % (ref 35.0–45.0)
Hemoglobin: 13.7 g/dL (ref 11.7–15.5)
Lymphocytes Relative: 34 %
Lymphs Abs: 2244 cells/uL (ref 850–3900)
MCH: 30.9 pg (ref 27.0–33.0)
MCHC: 33.6 g/dL (ref 32.0–36.0)
MCV: 92.1 fL (ref 80.0–100.0)
MPV: 11.2 fL (ref 7.5–12.5)
Monocytes Absolute: 594 cells/uL (ref 200–950)
Monocytes Relative: 9 %
Neutro Abs: 2970 cells/uL (ref 1500–7800)
Neutrophils Relative %: 45 %
Platelets: 249 10*3/uL (ref 140–400)
RBC: 4.43 MIL/uL (ref 3.80–5.10)
RDW: 13.5 % (ref 11.0–15.0)
WBC: 6.6 10*3/uL (ref 3.8–10.8)

## 2016-04-10 LAB — BASIC METABOLIC PANEL
BUN: 14 mg/dL (ref 7–25)
CO2: 28 mmol/L (ref 20–31)
Calcium: 9.3 mg/dL (ref 8.6–10.4)
Chloride: 105 mmol/L (ref 98–110)
Creat: 0.77 mg/dL (ref 0.60–0.93)
Glucose, Bld: 88 mg/dL (ref 65–99)
Potassium: 4.3 mmol/L (ref 3.5–5.3)
Sodium: 141 mmol/L (ref 135–146)

## 2016-04-11 ENCOUNTER — Encounter: Payer: Self-pay | Admitting: *Deleted

## 2016-04-11 LAB — HEMOGLOBIN A1C
Hgb A1c MFr Bld: 5.7 % — ABNORMAL HIGH (ref <5.7)
Mean Plasma Glucose: 117 mg/dL

## 2016-04-12 NOTE — Telephone Encounter (Signed)
letter mailed to patients home address with results.

## 2016-05-07 ENCOUNTER — Ambulatory Visit: Payer: Medicare Other | Admitting: Pulmonary Disease

## 2016-05-23 DIAGNOSIS — M25511 Pain in right shoulder: Secondary | ICD-10-CM | POA: Diagnosis not present

## 2016-05-23 DIAGNOSIS — M47812 Spondylosis without myelopathy or radiculopathy, cervical region: Secondary | ICD-10-CM | POA: Diagnosis not present

## 2016-05-28 ENCOUNTER — Other Ambulatory Visit: Payer: Self-pay | Admitting: Internal Medicine

## 2016-05-28 DIAGNOSIS — Z1231 Encounter for screening mammogram for malignant neoplasm of breast: Secondary | ICD-10-CM

## 2016-06-06 ENCOUNTER — Ambulatory Visit
Admission: RE | Admit: 2016-06-06 | Discharge: 2016-06-06 | Disposition: A | Discharge status: Home / Self Care | Payer: Medicare Other | Source: Ambulatory Visit | Attending: Internal Medicine | Admitting: Internal Medicine

## 2016-06-06 DIAGNOSIS — Z1231 Encounter for screening mammogram for malignant neoplasm of breast: Secondary | ICD-10-CM | POA: Diagnosis not present

## 2016-06-13 ENCOUNTER — Encounter: Payer: Self-pay | Admitting: *Deleted

## 2016-06-20 DIAGNOSIS — H2513 Age-related nuclear cataract, bilateral: Secondary | ICD-10-CM | POA: Diagnosis not present

## 2016-06-21 ENCOUNTER — Other Ambulatory Visit: Payer: Self-pay | Admitting: Pulmonary Disease

## 2016-07-25 DIAGNOSIS — H903 Sensorineural hearing loss, bilateral: Secondary | ICD-10-CM | POA: Diagnosis not present

## 2016-10-01 ENCOUNTER — Telehealth: Payer: Self-pay | Admitting: Internal Medicine

## 2016-10-01 NOTE — Telephone Encounter (Signed)
called pt per her request to Rudene Re about upcoming AWV appt that was scheduled back in Nov when I spoke to her. no answer. left msg letting pt know I was returning her call per her request to Rudene Re. VDM (DD)

## 2016-10-07 ENCOUNTER — Ambulatory Visit (INDEPENDENT_AMBULATORY_CARE_PROVIDER_SITE_OTHER): Payer: Medicare Other | Admitting: Internal Medicine

## 2016-10-07 ENCOUNTER — Ambulatory Visit: Payer: Medicare Other

## 2016-10-07 ENCOUNTER — Encounter: Payer: Self-pay | Admitting: Internal Medicine

## 2016-10-07 VITALS — BP 132/60 | HR 68 | Temp 97.9°F | Ht 62.0 in | Wt 133.0 lb

## 2016-10-07 DIAGNOSIS — G3184 Mild cognitive impairment, so stated: Secondary | ICD-10-CM | POA: Diagnosis not present

## 2016-10-07 DIAGNOSIS — M810 Age-related osteoporosis without current pathological fracture: Secondary | ICD-10-CM

## 2016-10-07 DIAGNOSIS — J301 Allergic rhinitis due to pollen: Secondary | ICD-10-CM | POA: Diagnosis not present

## 2016-10-07 DIAGNOSIS — J4520 Mild intermittent asthma, uncomplicated: Secondary | ICD-10-CM | POA: Diagnosis not present

## 2016-10-07 DIAGNOSIS — E7800 Pure hypercholesterolemia, unspecified: Secondary | ICD-10-CM

## 2016-10-07 DIAGNOSIS — Z0000 Encounter for general adult medical examination without abnormal findings: Secondary | ICD-10-CM

## 2016-10-07 DIAGNOSIS — R739 Hyperglycemia, unspecified: Secondary | ICD-10-CM

## 2016-10-07 DIAGNOSIS — F3341 Major depressive disorder, recurrent, in partial remission: Secondary | ICD-10-CM

## 2016-10-07 NOTE — Progress Notes (Signed)
Provider:  Rexene Edison. Mariea Clonts, D.O., C.M.D. Location:   [facility]   Place of Service:   clinic  Previous PCP: Hollace Kinnier, DO Patient Care Team: Gayland Curry, DO as PCP - General (Geriatric Medicine)  Extended Emergency Contact Information Primary Emergency Contact: [place] Phone: [phone] Relation: Other  Code Status: DNR Goals of Care: Advanced Directive information Advanced Directives 10/07/2016  Does Patient Have a Medical Advance Directive? No;Yes  Type of Advance Directive [place]  Does patient want to make changes to medical advance directive? -  Copy of [place] in Chart? No - copy requested    Chief Complaint  Patient presents with  . Annual Exam    HPI: Patient is a 76 y.o. female seen today for an annual physical exam. Wellness exam with Delrae Rend will need to be rescheduled.  MMSE done today with 25/30 and failed clock drawing (last 26/30 in 2016).  Sees memory clinic at [facility] and reports stability.  Clock today was quite odd with numbers on it backwards around the clock and not set properly.     Had bone density in feb 2017 and fosamax was recommended.  Pt not on it today.  Is on vitamin D and does weightbearing exercise.  She opted not to take the fosamax due to reading the package insert.  Encouraged her to take the fosamax or an alternative.  Says she will reconsider in 2019 after next test.   Having a lot of family stress today.  Has a sister who is having a serious surgery and she worries about her.  She has two learning disabled grandsons and her daughter is a single parent.  Was having neck and shoulder pain when she went to see Raliegh Ip (Dr. Alfonso Ramus)  .  Had been in left shoulder from neck and now in right shoulder from neck and down shoulder blade.  Using tylenol as needed every 8 hrs.  Did moist heat and did not need to do her PT.  Still walking on the treadmill.    Allergies/asthma:  More word  finding difficulty telling me about this today--no problems.    Past Medical History:  Diagnosis Date  . Allergic rhinitis   . Anxiety   . Asthma   . Hyperlipidemia   . Migraine with aura, without mention of intractable migraine without mention of status migrainosus    Past Surgical History:  Procedure Laterality Date  . APPENDECTOMY  1975  . BREAST BIOPSY  1976  . TUBAL LIGATION Bilateral 1976    reports that she has never smoked. She has never used smokeless tobacco. She reports that she drinks alcohol. She reports that she does not use drugs.  Functional Status Survey:  independent in ADLs  Family History  Problem Relation Age of Onset  . Asthma Father   . Asthma Sister   . Heart disease Sister   . Cancer Mother     brain, breast  . Cancer Brother 71  . Heart disease Brother   . Hypertension Brother     Health Maintenance  Topic Date Due  . Samul Dada  07/20/2023  . INFLUENZA VACCINE  Completed  . DEXA SCAN  Completed  . ZOSTAVAX  Completed  . PNA vac Low Risk Adult  Completed    Allergies  Allergen Reactions  . Aspirin     Advised by MD previously not to take  . Ceftin     Dizzy & Diarrhea  . Ciprofloxacin Hcl  Dizzy, diarrhea    Allergies as of 10/07/2016      Reactions   Aspirin    Advised by MD previously not to take   Ceftin    Dizzy & Diarrhea   Ciprofloxacin Hcl    Dizzy, diarrhea      Medication List       Accurate as of 10/07/16  2:16 PM. Always use your most recent med list.          ADVAIR HFA 230-21 MCG/ACT inhaler Generic drug:  fluticasone-salmeterol INHALE 2 PUFFS INTO THE LUNGS 2 (TWO) TIMES DAILY.   albuterol 108 (90 Base) MCG/ACT inhaler Commonly known as:  PROAIR HFA Inhale 2 puffs into the lungs every 6 (six) hours as needed for wheezing or shortness of breath.   Biotin 2500 MCG Caps Take by mouth daily.   cetirizine 10 MG tablet Commonly known as:  ZYRTEC Take one tablet once daily for allergies     citalopram 40 MG tablet Commonly known as:  CELEXA TAKE 1 TABLET BY MOUTH ONCE DAILY   fluticasone 50 MCG/ACT nasal spray Commonly known as:  FLONASE USE 1 SPRAY IN EACH NOSTRIL EVERY DAY AS NEEDED FOR ALLERGIES   lovastatin 40 MG tablet Commonly known as:  MEVACOR TAKE 1 TABLET BY MOUTH EVERY DAY TO LOWER CHLOESTEROL   montelukast 10 MG tablet Commonly known as:  SINGULAIR TAKE 1 TABLET (10 MG TOTAL) BY MOUTH DAILY.   MULTI FOR HER 50+ Caps Take by mouth daily.   Vitamin D3 2000 units Tabs Take 1 tablet by mouth daily.       Review of Systems  Constitutional: Negative for chills, fever and malaise/fatigue.  HENT: Negative for congestion and hearing loss.   Eyes: Negative for blurred vision.  Respiratory: Negative for cough and shortness of breath.   Cardiovascular: Negative for chest pain, palpitations and leg swelling.  Gastrointestinal: Negative for abdominal pain, blood in stool, constipation, diarrhea and melena.  Genitourinary: Negative for dysuria.  Musculoskeletal: Positive for joint pain and neck pain. Negative for falls and myalgias.  Skin: Negative for itching and rash.  Neurological: Negative for dizziness, loss of consciousness and weakness.  Endo/Heme/Allergies: Does not bruise/bleed easily.  Psychiatric/Behavioral: Positive for memory loss. Negative for depression, hallucinations, substance abuse and suicidal ideas. The patient is nervous/anxious. The patient does not have insomnia.     Vitals:   10/07/16 1358  BP: 132/60  Pulse: 68  Temp: 97.9 F (36.6 C)  TempSrc: Oral  SpO2: 94%  Weight: 133 lb (60.3 kg)  Height: 5\' 2"  (1.575 m)   Body mass index is 24.33 kg/m. Physical Exam  Constitutional: She appears well-developed and well-nourished. No distress.  HENT:  Head: Normocephalic and atraumatic.  Eyes: EOM are normal. Pupils are equal, round, and reactive to light.  Neck: Normal range of motion. Neck supple.  Cardiovascular: Normal rate,  regular rhythm, normal heart sounds and intact distal pulses.   Pulmonary/Chest: Effort normal and breath sounds normal. No respiratory distress. She has no wheezes.  Abdominal: Soft. Bowel sounds are normal.  Musculoskeletal: Normal range of motion. She exhibits no tenderness.  Neurological: She is alert. No cranial nerve deficit. Coordination normal.  Oddly disoriented to place today, but then able to say where she was later; also failed clock  Skin: Skin is warm and dry. Capillary refill takes less than 2 seconds.  Psychiatric:  Anxious today, pacing in room, tells me some concerns about family    Labs reviewed: Basic Metabolic Panel:  Recent Labs  10/18/15 1036 04/10/16 0926  NA 142 141  K 4.4 4.3  CL 101 105  CO2 25 28  GLUCOSE 85 88  BUN 11 14  CREATININE 0.63 0.77  CALCIUM 9.5 9.3   Liver Function Tests:  Recent Labs  10/18/15 1036  AST 21  ALT 21  ALKPHOS 70  BILITOT 0.3  PROT 6.7  ALBUMIN 4.3   No results for input(s): LIPASE, AMYLASE in the last 8760 hours. No results for input(s): AMMONIA in the last 8760 hours. CBC:  Recent Labs  10/18/15 1036 04/10/16 0926  WBC 6.5 6.6  NEUTROABS 3.6 2,970  HGB  --  13.7  HCT 41.3 40.8  MCV 92 92.1  PLT 293 249   Cardiac Enzymes: No results for input(s): CKTOTAL, CKMB, CKMBINDEX, TROPONINI in the last 8760 hours. BNP: Invalid input(s): POCBNP Lab Results  Component Value Date   HGBA1C 5.7 (H) 04/10/2016   Imaging and Procedures Recently: Bone density 09/27/15:   MM 06/06/16 Negative, f/u 1 year at [facility]  Assessment/Plan 1. Mild cognitive impairment with memory loss -worse today, but very anxious (see hpi) -cont celexa, monitor  2. Mild intermittent chronic asthma without complication -cont singulair, proair, advair  3. Senile osteoporosis -cont vitamin D, recommended she cont weightbearing exercise and take fosamax but she refuses fosamax--will readdress after next bone density  4.  Recurrent major depressive disorder, in partial remission ([place]) -cont celexa, has increased family stressors lately affecting her cognition  5. Chronic seasonal allergic rhinitis due to pollen -controlled, cont flonase, zyrtec   6. Pure hypercholesterolemia -cont to work on diet and exercise - Lipid panel; Future  7. Hyperglycemia - cont to work on diet and exercise - CBC with Differential/Platelet; Future - COMPLETE METABOLIC PANEL WITH GFR; Future - Hemoglobin A1c; Future  8. Annual physical exam -completed today - CBC with Differential/Platelet; Future - COMPLETE METABOLIC PANEL WITH GFR; Future - Hemoglobin A1c; Future - Lipid panel; Future  Labs/tests ordered:   Orders Placed This Encounter  Procedures  . CBC with Differential/Platelet    Standing Status:   Future    Standing Expiration Date:   10/07/2017  . COMPLETE METABOLIC PANEL WITH GFR    [facility]    Standing Status:   Future    Standing Expiration Date:   10/07/2017  . Hemoglobin A1c    Standing Status:   Future    Standing Expiration Date:   10/07/2017  . Lipid panel    Standing Status:   Future    Standing Expiration Date:   10/07/2017    Order Specific Question:   Has the patient fasted?    Answer:   Yes     Carrissa Taitano L. Adilyn Humes, D.O. [facility] [address],  [address] Cell Phone (Mon-Fri 8am-5pm):  [phone] On Call:  [phone] & follow prompts after 5pm & weekends Office Phone:  [phone] Office Fax:  [phone]

## 2016-11-14 ENCOUNTER — Ambulatory Visit: Payer: Medicare Other

## 2016-11-23 ENCOUNTER — Other Ambulatory Visit: Payer: Self-pay | Admitting: Internal Medicine

## 2016-12-05 ENCOUNTER — Ambulatory Visit (INDEPENDENT_AMBULATORY_CARE_PROVIDER_SITE_OTHER): Payer: Medicare Other

## 2016-12-05 VITALS — BP 112/58 | HR 78 | Temp 97.8°F | Ht 62.0 in | Wt 131.0 lb

## 2016-12-05 DIAGNOSIS — Z0000 Encounter for general adult medical examination without abnormal findings: Secondary | ICD-10-CM | POA: Diagnosis not present

## 2016-12-05 NOTE — Patient Instructions (Addendum)
Melinda Griffin , Thank you for taking time to come for your Medicare Wellness Visit. I appreciate your ongoing commitment to your health goals. Please review the following plan we discussed and let me know if I can assist you in the future.   Screening recommendations/referrals: Colonoscopy up to date Mammogram up to date Bone Density up to date Recommended yearly ophthalmology/optometry visit for glaucoma screening and checkup Recommended yearly dental visit for hygiene and checkup  Vaccinations: Influenza vaccine up to date Pneumococcal vaccine up to date Tdap vaccine up to date, due 07/20/2023 Shingles vaccine up to date.  Advanced directives: Advance directive discussed with you today.  Once this is complete please bring a copy in to our office so we can scan it into your chart.   Conditions/risks identified: None  Next appointment: Dr. Mariea Clonts 8/20 @ 11am   Preventive Care 65 Years and Older, Female Preventive care refers to lifestyle choices and visits with your health care provider that can promote health and wellness. What does preventive care include?  A yearly physical exam. This is also called an annual well check.  Dental exams once or twice a year.  Routine eye exams. Ask your health care provider how often you should have your eyes checked.  Personal lifestyle choices, including:  Daily care of your teeth and gums.  Regular physical activity.  Eating a healthy diet.  Avoiding tobacco and drug use.  Limiting alcohol use.  Practicing safe sex.  Taking low-dose aspirin every day.  Taking vitamin and mineral supplements as recommended by your health care provider. What happens during an annual well check? The services and screenings done by your health care provider during your annual well check will depend on your age, overall health, lifestyle risk factors, and family history of disease. Counseling  Your health care provider may ask you questions about  your:  Alcohol use.  Tobacco use.  Drug use.  Emotional well-being.  Home and relationship well-being.  Sexual activity.  Eating habits.  History of falls.  Memory and ability to understand (cognition).  Work and work Statistician.  Reproductive health. Screening  You may have the following tests or measurements:  Height, weight, and BMI.  Blood pressure.  Lipid and cholesterol levels. These may be checked every 5 years, or more frequently if you are over 80 years old.  Skin check.  Lung cancer screening. You may have this screening every year starting at age 79 if you have a 30-pack-year history of smoking and currently smoke or have quit within the past 15 years.  Fecal occult blood test (FOBT) of the stool. You may have this test every year starting at age 73.  Flexible sigmoidoscopy or colonoscopy. You may have a sigmoidoscopy every 5 years or a colonoscopy every 10 years starting at age 76.  Hepatitis C blood test.  Hepatitis B blood test.  Sexually transmitted disease (STD) testing.  Diabetes screening. This is done by checking your blood sugar (glucose) after you have not eaten for a while (fasting). You may have this done every 1-3 years.  Bone density scan. This is done to screen for osteoporosis. You may have this done starting at age 49.  Mammogram. This may be done every 1-2 years. Talk to your health care provider about how often you should have regular mammograms. Talk with your health care provider about your test results, treatment options, and if necessary, the need for more tests. Vaccines  Your health care provider may recommend certain vaccines,  such as:  Influenza vaccine. This is recommended every year.  Tetanus, diphtheria, and acellular pertussis (Tdap, Td) vaccine. You may need a Td booster every 10 years.  Zoster vaccine. You may need this after age 72.  Pneumococcal 13-valent conjugate (PCV13) vaccine. One dose is recommended  after age 50.  Pneumococcal polysaccharide (PPSV23) vaccine. One dose is recommended after age 48. Talk to your health care provider about which screenings and vaccines you need and how often you need them. This information is not intended to replace advice given to you by your health care provider. Make sure you discuss any questions you have with your health care provider. Document Released: 09/08/2015 Document Revised: 05/01/2016 Document Reviewed: 06/13/2015 Elsevier Interactive Patient Education  2017 [place] Prevention in the Home Falls can cause injuries. They can happen to people of all ages. There are many things you can do to make your home safe and to help prevent falls. What can I do on the outside of my home?  Regularly fix the edges of walkways and driveways and fix any cracks.  Remove anything that might make you trip as you walk through a door, such as a raised step or threshold.  Trim any bushes or trees on the path to your home.  Use bright outdoor lighting.  Clear any walking paths of anything that might make someone trip, such as rocks or tools.  Regularly check to see if handrails are loose or broken. Make sure that both sides of any steps have handrails.  Any raised decks and porches should have guardrails on the edges.  Have any leaves, snow, or ice cleared regularly.  Use sand or salt on walking paths during winter.  Clean up any spills in your garage right away. This includes oil or grease spills. What can I do in the bathroom?  Use night lights.  Install grab bars by the toilet and in the tub and shower. Do not use towel bars as grab bars.  Use non-skid mats or decals in the tub or shower.  If you need to sit down in the shower, use a plastic, non-slip stool.  Keep the floor dry. Clean up any water that spills on the floor as soon as it happens.  Remove soap buildup in the tub or shower regularly.  Attach bath mats securely with  double-sided non-slip rug tape.  Do not have throw rugs and other things on the floor that can make you trip. What can I do in the bedroom?  Use night lights.  Make sure that you have a light by your bed that is easy to reach.  Do not use any sheets or blankets that are too big for your bed. They should not hang down onto the floor.  Have a firm chair that has side arms. You can use this for support while you get dressed.  Do not have throw rugs and other things on the floor that can make you trip. What can I do in the kitchen?  Clean up any spills right away.  Avoid walking on wet floors.  Keep items that you use a lot in easy-to-reach places.  If you need to reach something above you, use a strong step stool that has a grab bar.  Keep electrical cords out of the way.  Do not use floor polish or wax that makes floors slippery. If you must use wax, use non-skid floor wax.  Do not have throw rugs and other things on  the floor that can make you trip. What can I do with my stairs?  Do not leave any items on the stairs.  Make sure that there are handrails on both sides of the stairs and use them. Fix handrails that are broken or loose. Make sure that handrails are as long as the stairways.  Check any carpeting to make sure that it is firmly attached to the stairs. Fix any carpet that is loose or worn.  Avoid having throw rugs at the top or bottom of the stairs. If you do have throw rugs, attach them to the floor with carpet tape.  Make sure that you have a light switch at the top of the stairs and the bottom of the stairs. If you do not have them, ask someone to add them for you. What else can I do to help prevent falls?  Wear shoes that:  Do not have high heels.  Have rubber bottoms.  Are comfortable and fit you well.  Are closed at the toe. Do not wear sandals.  If you use a stepladder:  Make sure that it is fully opened. Do not climb a closed stepladder.  Make  sure that both sides of the stepladder are locked into place.  Ask someone to hold it for you, if possible.  Clearly mark and make sure that you can see:  Any grab bars or handrails.  First and last steps.  Where the edge of each step is.  Use tools that help you move around (mobility aids) if they are needed. These include:  Canes.  Walkers.  Scooters.  Crutches.  Turn on the lights when you go into a dark area. Replace any light bulbs as soon as they burn out.  Set up your furniture so you have a clear path. Avoid moving your furniture around.  If any of your floors are uneven, fix them.  If there are any pets around you, be aware of where they are.  Review your medicines with your doctor. Some medicines can make you feel dizzy. This can increase your chance of falling. Ask your doctor what other things that you can do to help prevent falls. This information is not intended to replace advice given to you by your health care provider. Make sure you discuss any questions you have with your health care provider. Document Released: 06/08/2009 Document Revised: 01/18/2016 Document Reviewed: 09/16/2014 Elsevier Interactive Patient Education  2017 [employer].

## 2016-12-05 NOTE — Progress Notes (Signed)
Subjective:   Melinda Griffin is a 76 y.o. female who presents for an Initial Medicare Annual Wellness Visit.     Objective:    Today's Vitals   12/05/16 1355  BP: (!) 112/58  Pulse: 78  Temp: 97.8 F (36.6 C)  TempSrc: Oral  SpO2: 97%  Weight: 131 lb (59.4 kg)  Height: 5\' 2"  (1.575 m)   Body mass index is 23.96 kg/m.   Current Medications (verified) Outpatient Encounter Prescriptions as of 12/05/2016  Medication Sig  . albuterol (PROAIR HFA) 108 (90 BASE) MCG/ACT inhaler Inhale 2 puffs into the lungs every 6 (six) hours as needed for wheezing or shortness of breath.  Marland Kitchen BIOTIN PO Take 2,000 mcg by mouth daily.  . cetirizine (ZYRTEC) 10 MG tablet Take one tablet once daily for allergies  . Cholecalciferol (VITAMIN D3) 2000 UNITS TABS Take 1 tablet by mouth daily.   . citalopram (CELEXA) 40 MG tablet TAKE 1 TABLET BY MOUTH ONCE DAILY  . fluticasone (FLONASE) 50 MCG/ACT nasal spray USE 1 SPRAY IN EACH NOSTRIL EVERY DAY AS NEEDED FOR ALLERGIES  . fluticasone-salmeterol (ADVAIR HFA) 230-21 MCG/ACT inhaler Inhale 1 puff into the lungs daily.  Marland Kitchen lovastatin (MEVACOR) 40 MG tablet TAKE 1 TABLET BY MOUTH EVERY DAY TO LOWER CHLOESTEROL  . montelukast (SINGULAIR) 10 MG tablet TAKE 1 TABLET (10 MG TOTAL) BY MOUTH DAILY.  . Multiple Vitamins-Minerals (MULTI FOR HER 50+) CAPS Take by mouth daily.  Marland Kitchen ADVAIR HFA 230-21 MCG/ACT inhaler INHALE 2 PUFFS INTO THE LUNGS 2 (TWO) TIMES DAILY. (Patient not taking: Reported on 12/05/2016)  . [DISCONTINUED] Biotin 2500 MCG CAPS Take by mouth daily.   No facility-administered encounter medications on file as of 12/05/2016.     Allergies (verified) Aspirin; Ceftin; and Ciprofloxacin hcl   History: Past Medical History:  Diagnosis Date  . Allergic rhinitis   . Anxiety   . Asthma   . Hyperlipidemia   . Migraine with aura, without mention of intractable migraine without mention of status migrainosus    Past Surgical History:  Procedure  Laterality Date  . APPENDECTOMY  1975  . BREAST BIOPSY  1976  . TUBAL LIGATION Bilateral 1976   Family History  Problem Relation Age of Onset  . Asthma Father   . Asthma Sister   . Heart disease Sister   . Cancer Mother     brain, breast  . Cancer Brother 75  . Heart disease Brother   . Hypertension Brother    Social History   Occupational History  . Not on file.   Social History Main Topics  . Smoking status: Never Smoker  . Smokeless tobacco: Never Used  . Alcohol use No  . Drug use: No  . Sexual activity: Not on file    Tobacco Counseling Counseling given: Not Answered   Activities of Daily Living In your present state of health, do you have any difficulty performing the following activities: 12/05/2016  Hearing? N  Vision? N  Difficulty concentrating or making decisions? N  Walking or climbing stairs? N  Dressing or bathing? N  Doing errands, shopping? N  Preparing Food and eating ? N  Using the Toilet? N  In the past six months, have you accidently leaked urine? N  Do you have problems with loss of bowel control? N  Managing your Medications? N  Managing your Finances? N  Housekeeping or managing your Housekeeping? N  Some recent data might be hidden    Immunizations and Health  Maintenance Immunization History  Administered Date(s) Administered  . Influenza Split 04/27/2012  . Influenza Whole 05/26/2009, 04/26/2010, 05/27/2011  . Influenza-Unspecified 05/07/2013, 05/09/2014, 05/05/2015, 05/06/2016  . Pneumococcal Conjugate-13 11/29/2013  . Pneumococcal Polysaccharide-23 04/27/2007  . Td 07/19/2013  . Zoster 03/26/2013   There are no preventive care reminders to display for this patient.  Patient Care Team: Gayland Curry, DO as PCP - General (Geriatric Medicine)  Indicate any recent Medical Services you may have received from other than [facility] providers in the past year (date may be approximate).     Assessment:   This is a routine wellness  examination for Melinda Griffin.   Hearing/Vision screen No exam data present  Dietary issues and exercise activities discussed: Current Exercise Habits: Home exercise routine, Type of exercise: walking, Time (Minutes): 35, Frequency (Times/Week): 7, Weekly Exercise (Minutes/Week): 245, Intensity: Mild, Exercise limited by: None identified  Goals    . Spiritual Goal          Starting 12/05/16 I would like to focus on my spiritual health with Melinda Griffin.      Depression Screen PHQ 2/9 Scores 12/05/2016 10/07/2016 03/28/2016 10/19/2015 10/19/2015 07/13/2014 11/30/2012  PHQ - 2 Score 0 0 0 0 0 0 0    Fall Risk Fall Risk  12/05/2016 10/07/2016 03/28/2016 10/19/2015 07/13/2015  Falls in the past year? No No No No Yes  Number falls in past yr: - - - - 1  Injury with Fall? - - - - Yes    Cognitive Function: MMSE - Mini Mental State Exam 12/05/2016 10/07/2016 01/09/2015  Orientation to time 5 4 5   Orientation to Place 5 4 5   Registration 3 3 3   Attention/ Calculation 5 5 4   Recall 1 0 1  Language- name 2 objects 2 2 2   Language- repeat 1 1 0  Language- follow 3 step command 3 3 3   Language- read & follow direction 1 1 1   Write a sentence 1 1 1   Copy design 1 1 1   Total score 28 25 26         Screening Tests Health Maintenance  Topic Date Due  . INFLUENZA VACCINE  03/26/2017  . TETANUS/TDAP  07/20/2023  . DEXA SCAN  Completed  . PNA vac Low Risk Adult  Completed      Plan:  I have personally reviewed and addressed the Medicare Annual Wellness questionnaire and have noted the following in the patient's chart:  A. Medical and social history B. Use of alcohol, tobacco or illicit drugs  C. Current medications and supplements D. Functional ability and status E.  Nutritional status F.  Physical activity G. Advance directives H. List of other physicians I.  Hospitalizations, surgeries, and ER visits in previous 12 months J.  [place] to include hearing, vision, cognitive,  depression L. Referrals and appointments - none  In addition, I have reviewed and discussed with patient certain preventive protocols, quality metrics, and best practice recommendations. A written personalized care plan for preventive services as well as general preventive health recommendations were provided to patient.  See attached scanned questionnaire for additional information.   Signed,   Rich Reining, RN Nurse Health Advisor

## 2016-12-05 NOTE — Progress Notes (Signed)
   I reviewed health advisor's note, was available for consultation and agree with the assessment and plan as written.  Pt's husband had stopped by with concerns about worsening cognitive status in his wife lately and concerns about her financial decisions.  Kalijah Westfall L. Govind Furey, D.O. [facility] [address], [place] [address] Cell Phone (Mon-Fri 8am-5pm):  [phone] On Call:  [phone] & follow prompts after 5pm & weekends Office Phone:  [phone] Office Fax:  [phone]   Quick Notes   Health Maintenance: None     Abnormal Screen: MMSE 28/30. Passed clock drawing.     Patient Concerns: None    Nurse Concerns: None

## 2016-12-23 ENCOUNTER — Other Ambulatory Visit: Payer: Self-pay

## 2017-03-10 ENCOUNTER — Other Ambulatory Visit: Payer: Self-pay

## 2017-03-10 ENCOUNTER — Telehealth: Payer: Self-pay | Admitting: Diagnostic Neuroimaging

## 2017-03-10 MED ORDER — ZOSTER VAC RECOMB ADJUVANTED 50 MCG/0.5ML IM SUSR: 0.5000 mL | Freq: Once | INTRAMUSCULAR | 1 refills | Status: AC

## 2017-03-10 NOTE — Telephone Encounter (Signed)
Patient would like a call back from the nurse regarding her [place]. Best call back is [phone].

## 2017-03-10 NOTE — Telephone Encounter (Signed)
LMVM for pt that was returning her call.  

## 2017-03-11 NOTE — Telephone Encounter (Signed)
Spoke to pt and she wanted to let me know that her daughter was POA and HCPOA.  (she did not want [place] to discuss her care with her husband).  She has appt scheduled with Dr. Leta Baptist in 05/2017, but wanted one sooner if one became available.

## 2017-04-04 DIAGNOSIS — L570 Actinic keratosis: Secondary | ICD-10-CM | POA: Diagnosis not present

## 2017-04-04 DIAGNOSIS — D692 Other nonthrombocytopenic purpura: Secondary | ICD-10-CM | POA: Diagnosis not present

## 2017-04-04 DIAGNOSIS — D485 Neoplasm of uncertain behavior of skin: Secondary | ICD-10-CM | POA: Diagnosis not present

## 2017-04-04 DIAGNOSIS — L821 Other seborrheic keratosis: Secondary | ICD-10-CM | POA: Diagnosis not present

## 2017-04-04 DIAGNOSIS — D225 Melanocytic nevi of trunk: Secondary | ICD-10-CM | POA: Diagnosis not present

## 2017-04-04 DIAGNOSIS — D1801 Hemangioma of skin and subcutaneous tissue: Secondary | ICD-10-CM | POA: Diagnosis not present

## 2017-04-10 ENCOUNTER — Other Ambulatory Visit: Payer: Medicare Other

## 2017-04-11 ENCOUNTER — Other Ambulatory Visit: Payer: Medicare Other

## 2017-04-11 DIAGNOSIS — R739 Hyperglycemia, unspecified: Secondary | ICD-10-CM

## 2017-04-11 DIAGNOSIS — E7800 Pure hypercholesterolemia, unspecified: Secondary | ICD-10-CM

## 2017-04-11 DIAGNOSIS — Z0000 Encounter for general adult medical examination without abnormal findings: Secondary | ICD-10-CM

## 2017-04-11 LAB — CBC WITH DIFFERENTIAL/PLATELET
Basophils Absolute: 69 cells/uL (ref 0–200)
Basophils Relative: 1 %
Eosinophils Absolute: 621 cells/uL — ABNORMAL HIGH (ref 15–500)
Eosinophils Relative: 9 %
HCT: 41.1 % (ref 35.0–45.0)
Hemoglobin: 13.9 g/dL (ref 11.7–15.5)
Lymphocytes Relative: 29 %
Lymphs Abs: 2001 cells/uL (ref 850–3900)
MCH: 31.9 pg (ref 27.0–33.0)
MCHC: 33.8 g/dL (ref 32.0–36.0)
MCV: 94.3 fL (ref 80.0–100.0)
MPV: 10.8 fL (ref 7.5–12.5)
Monocytes Absolute: 621 cells/uL (ref 200–950)
Monocytes Relative: 9 %
Neutro Abs: 3588 cells/uL (ref 1500–7800)
Neutrophils Relative %: 52 %
Platelets: 282 10*3/uL (ref 140–400)
RBC: 4.36 MIL/uL (ref 3.80–5.10)
RDW: 13.4 % (ref 11.0–15.0)
WBC: 6.9 10*3/uL (ref 3.8–10.8)

## 2017-04-12 LAB — COMPLETE METABOLIC PANEL WITH GFR
ALT: 16 U/L (ref 6–29)
AST: 21 U/L (ref 10–35)
Albumin: 4.2 g/dL (ref 3.6–5.1)
Alkaline Phosphatase: 62 U/L (ref 33–130)
BUN: 13 mg/dL (ref 7–25)
CO2: 24 mmol/L (ref 20–32)
Calcium: 9.5 mg/dL (ref 8.6–10.4)
Chloride: 104 mmol/L (ref 98–110)
Creat: 0.67 mg/dL (ref 0.60–0.93)
GFR, Est African American: >89 mL/min (ref >=60)
GFR, Est Non African American: 84 mL/min (ref >=60)
Glucose, Bld: 100 mg/dL — ABNORMAL HIGH (ref 65–99)
Potassium: 4.3 mmol/L (ref 3.5–5.3)
Sodium: 139 mmol/L (ref 135–146)
Total Bilirubin: 0.4 mg/dL (ref 0.2–1.2)
Total Protein: 6.7 g/dL (ref 6.1–8.1)

## 2017-04-12 LAB — LIPID PANEL
Cholesterol: 176 mg/dL (ref <200)
HDL: 60 mg/dL (ref >50)
LDL Cholesterol: 81 mg/dL (ref <100)
Total CHOL/HDL Ratio: 2.9 Ratio (ref <5.0)
Triglycerides: 174 mg/dL — ABNORMAL HIGH (ref <150)
VLDL: 35 mg/dL — ABNORMAL HIGH (ref <30)

## 2017-04-12 LAB — HEMOGLOBIN A1C
Hgb A1c MFr Bld: 5.8 % — ABNORMAL HIGH (ref <5.7)
Mean Plasma Glucose: 120 mg/dL

## 2017-04-14 ENCOUNTER — Ambulatory Visit (INDEPENDENT_AMBULATORY_CARE_PROVIDER_SITE_OTHER): Payer: Medicare Other | Admitting: Internal Medicine

## 2017-04-14 ENCOUNTER — Encounter: Payer: Self-pay | Admitting: Internal Medicine

## 2017-04-14 VITALS — BP 118/70 | HR 77 | Temp 98.0°F | Wt 130.0 lb

## 2017-04-14 DIAGNOSIS — G3184 Mild cognitive impairment, so stated: Secondary | ICD-10-CM | POA: Diagnosis not present

## 2017-04-14 DIAGNOSIS — F3341 Major depressive disorder, recurrent, in partial remission: Secondary | ICD-10-CM

## 2017-04-14 DIAGNOSIS — J4520 Mild intermittent asthma, uncomplicated: Secondary | ICD-10-CM

## 2017-04-14 DIAGNOSIS — J301 Allergic rhinitis due to pollen: Secondary | ICD-10-CM

## 2017-04-14 DIAGNOSIS — R739 Hyperglycemia, unspecified: Secondary | ICD-10-CM

## 2017-04-14 DIAGNOSIS — Z630 Problems in relationship with spouse or partner: Secondary | ICD-10-CM | POA: Insufficient documentation

## 2017-04-14 NOTE — Progress Notes (Signed)
Location:  [facility] Provider:  Orange Hilligoss L. Mariea Clonts, D.O., C.M.D.  Code Status: DNR Goals of Care:  Advanced Directives 04/14/2017  Does Patient Have a Medical Advance Directive? Yes  Type of Advance Directive [place]  Does patient want to make changes to medical advance directive? -  Copy of [place] in Chart? Yes  Would patient like information on creating a medical advance directive? -   Chief Complaint  Patient presents with  . Medical Management of Chronic Issues    6MTH FOLLOW-UP    HPI: Patient is a 76 y.o. female seen today for medical management of chronic diseases.    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  She's been married for 35 years.  She reports that goals change over the years and her household is not as warm and pleasant.  She says she is safe at home and her two daughters check on her.  She has church support.    She feels her memory is doing better.  She is taking better care of herself mentally.  Her husband was tracking her whereabouts. There had been concerns about her not knowing where she was or where she was going.  Word finding is getting worse.    Asthma: stable, no wheezing.    Allergies:  Has some hoarseness and congestion.  Reports due to change in weather.  Affecting her maxillary sinuses.    Reviewed that hyperglycemia slightly up and lipids slightly up.  Was doing an afternoon snack that she used to skip.  Started walking at that time instead.  [facility]    Past Medical History:  Diagnosis Date  . Allergic rhinitis   . Anxiety   . Asthma   . Hyperlipidemia   . Migraine with aura, without mention of intractable migraine without mention of status migrainosus     Past Surgical History:  Procedure Laterality Date  . APPENDECTOMY  1975  . BREAST BIOPSY  1976  . TUBAL LIGATION Bilateral 1976    Allergies  Allergen  Reactions  . Aspirin     Advised by MD previously not to take  . Ceftin     Dizzy & Diarrhea  . Ciprofloxacin Hcl     Dizzy, diarrhea    Allergies as of 04/14/2017      Reactions   Aspirin    Advised by MD previously not to take   Ceftin    Dizzy & Diarrhea   Ciprofloxacin Hcl    Dizzy, diarrhea      Medication List       Accurate as of 04/14/17 11:54 AM. Always use your most recent med list.          albuterol 108 (90 Base) MCG/ACT inhaler Commonly known as:  PROAIR HFA Inhale 2 puffs into the lungs every 6 (six) hours as needed for wheezing or shortness of breath.   BIOTIN PO Take 2,000 mcg by mouth daily.   cetirizine 10 MG tablet Commonly known as:  ZYRTEC Take one tablet once daily for allergies   citalopram 40 MG tablet Commonly known as:  CELEXA TAKE 1 TABLET BY MOUTH ONCE DAILY   fluticasone 50 MCG/ACT nasal spray Commonly known as:  FLONASE USE 1 SPRAY IN EACH NOSTRIL EVERY DAY AS NEEDED FOR ALLERGIES   fluticasone-salmeterol 230-21 MCG/ACT inhaler Commonly known as:  ADVAIR HFA Inhale 1 puff into the lungs  daily.   lovastatin 40 MG tablet Commonly known as:  MEVACOR TAKE 1 TABLET BY MOUTH EVERY DAY TO LOWER CHLOESTEROL   montelukast 10 MG tablet Commonly known as:  SINGULAIR TAKE 1 TABLET (10 MG TOTAL) BY MOUTH DAILY.   MULTI FOR HER 50+ Caps Take by mouth daily.   Vitamin D3 2000 units Tabs Take 1 tablet by mouth daily.       Review of Systems:  Review of Systems  Constitutional: Negative for chills, fever and malaise/fatigue.  HENT: Negative for congestion and hearing loss.   Eyes: Negative for blurred vision.  Respiratory: Positive for cough. Negative for shortness of breath and wheezing.   Cardiovascular: Negative for chest pain, palpitations and leg swelling.  Gastrointestinal: Negative for abdominal pain.  Genitourinary: Negative for dysuria.  Musculoskeletal: Negative for falls.  Skin: Negative for itching and rash.    Neurological: Negative for dizziness, loss of consciousness and weakness.  Psychiatric/Behavioral: Positive for memory loss. Negative for depression. The patient is nervous/anxious. The patient does not have insomnia.     Health Maintenance  Topic Date Due  . INFLUENZA VACCINE  03/26/2017  . TETANUS/TDAP  07/20/2023  . DEXA SCAN  Completed  . PNA vac Low Risk Adult  Completed    Physical Exam: Vitals:   04/14/17 1121  BP: 118/70  Pulse: 77  Temp: 98 F (36.7 C)  TempSrc: Oral  SpO2: 95%  Weight: 130 lb (59 kg)   Body mass index is 23.78 kg/m. Physical Exam  Constitutional: She is oriented to person, place, and time. She appears well-developed and well-nourished. No distress.  Cardiovascular: Normal rate, regular rhythm, normal heart sounds and intact distal pulses.   Pulmonary/Chest: Effort normal and breath sounds normal. She has no wheezes.  Abdominal: Bowel sounds are normal.  Musculoskeletal: Normal range of motion.  Neurological: She is alert and oriented to person, place, and time.  Increased word-finding difficulty, slight action tremor  Skin: Skin is warm and dry.  Psychiatric:  Slight anxiety    Labs reviewed: Basic Metabolic Panel:  Recent Labs  04/11/17 0939  NA 139  K 4.3  CL 104  CO2 24  GLUCOSE 100*  BUN 13  CREATININE 0.67  CALCIUM 9.5   Liver Function Tests:  Recent Labs  04/11/17 0939  AST 21  ALT 16  ALKPHOS 62  BILITOT 0.4  PROT 6.7  ALBUMIN 4.2   No results for input(s): LIPASE, AMYLASE in the last 8760 hours. No results for input(s): AMMONIA in the last 8760 hours. CBC:  Recent Labs  04/11/17 0939  WBC 6.9  NEUTROABS 3,588  HGB 13.9  HCT 41.1  MCV 94.3  PLT 282   Lipid Panel:  Recent Labs  04/11/17 0939  CHOL 176  HDL 60  LDLCALC 81  TRIG 174*  CHOLHDL 2.9   Lab Results  Component Value Date   HGBA1C 5.8 (H) 04/11/2017     Assessment/Plan 1. Mild cognitive impairment with memory loss -seems  word-finding has progressed -it's difficult to tell how much of her story vs her husband's story is true about her financial management abilities--he questioned her judgment about supporting her grandchildren who have Angelman's syndrome and sounds like legal issues were involved in how this money came into existence (per husband), but pt seems quite clear-headed, just slow with words and mmse has been better than when I first met her when she took xanax -she does have a f/u neurology appt already scheduled and will need a  repeat memory assessment at that visit -she has decided to remove her husband from all of her care and they are not getting along though still living together and still married   2. Recurrent major depressive disorder, in partial remission ([place]) -seems to be doing well despite domestic stressors--on celexa since coming off xanax  3. Mild intermittent chronic asthma without complication -cont singulair, albuterol, advair  4. Seasonal allergic rhinitis due to pollen -cont zyrtec  5. Relationship problem between partners -see #1, hard to tell how much is a difference in opinion vs poor judgment due to cognitive decline -may need repeat neuropsych eval   6. Hyperglycemia - f/u labs - CBC with Differential/Platelet; Future - Hemoglobin A1c; Future - Lipid panel; Future - Basic metabolic panel; Future  Labs/tests ordered:   Orders Placed This Encounter  Procedures  . CBC with Differential/Platelet    Standing Status:   Future    Standing Expiration Date:   04/14/2018  . Hemoglobin A1c    Standing Status:   Future    Standing Expiration Date:   04/14/2018  . Lipid panel    Standing Status:   Future    Standing Expiration Date:   04/14/2018    Order Specific Question:   Has the patient fasted?    Answer:   Yes  . Basic metabolic panel    Standing Status:   Future    Standing Expiration Date:   04/14/2018    Order Specific Question:   Has the patient fasted?     Answer:   Yes   Next appt:  6 mos med mgt, labs before   Ava Vence Lalor, D.O. [facility] [address],  [address] Cell Phone (Mon-Fri 8am-5pm):  [phone] On Call:  [phone] & follow prompts after 5pm & weekends Office Phone:  [phone] Office Fax:  [phone]

## 2017-05-22 ENCOUNTER — Encounter: Payer: Self-pay | Admitting: Diagnostic Neuroimaging

## 2017-05-30 ENCOUNTER — Ambulatory Visit: Payer: Medicare Other | Admitting: Diagnostic Neuroimaging

## 2017-05-31 ENCOUNTER — Other Ambulatory Visit: Payer: Self-pay | Admitting: Internal Medicine

## 2017-06-29 ENCOUNTER — Other Ambulatory Visit: Payer: Self-pay | Admitting: Internal Medicine

## 2017-06-30 ENCOUNTER — Other Ambulatory Visit: Payer: Self-pay | Admitting: Internal Medicine

## 2017-07-23 ENCOUNTER — Ambulatory Visit: Payer: Medicare Other | Admitting: Diagnostic Neuroimaging

## 2017-07-23 ENCOUNTER — Encounter: Payer: Self-pay | Admitting: Diagnostic Neuroimaging

## 2017-07-23 DIAGNOSIS — R413 Other amnesia: Secondary | ICD-10-CM

## 2017-07-23 NOTE — Patient Instructions (Signed)

## 2017-07-23 NOTE — Progress Notes (Signed)
[facility]  PATIENT: Melinda Griffin DOB: 03/16/1941  REFERRING CLINICIAN: Mariea Griffin  / Melinda Griffin HISTORY FROM: patient (outside records reviewed) REASON FOR VISIT: follow up    HISTORICAL  CHIEF COMPLAINT:  Chief Complaint  Patient presents with  . Follow-up  . MCI    doing ok    HISTORY OF PRESENT ILLNESS:   UPDATE (07/23/17, VRP): Since last visit, doing well. Maintains her ADLs. Active at church. Taking a class at church. There is significant psychosocial stress factors esp related to her husband and grandchildren and financial factors. No alleviating or aggravating factors. She has occ word finding diff, esp when stressed.  PRIOR HPI (01/09/15): 76 year old female here for evaluation of memory problems. Patient developed some mild memory problems around 2010, with some difficulty remembering names, word finding, taking more time to prepare meals. However she did not have significant difficulty with most of her activities of daily living. Patient minimizes any significant memory problems. Apparently her family was more concerned. Through the help of her daughter, patient enrolled into some type of research study at [facility] with Dr. Alford Griffin (2012), who evaluated patient with PET scan and neuropsychological testing. Patient was found to have some mild cognitive deficiencies mainly in areas of memory and executive functioning. She was diagnosed with mild cognitive impairment. She was managed conservatively. Patient followed up with Dr. Lavone Griffin in 2014 at [facility], who also agreed with the diagnosis. Since that time patient has retired earlier in 2016, on her own terms. She was doing well with her work-related performance. She has retired to spend more time with her family enjoy vacation and travel.   REVIEW OF SYSTEMS: Full 14 system review of systems performed and negative except: only as per HPI.    ALLERGIES: Allergies  Allergen Reactions  . Aspirin     Advised by MD  previously not to take  . Ceftin     Dizzy & Diarrhea  . Ciprofloxacin Hcl     Dizzy, diarrhea    HOME MEDICATIONS: Outpatient Medications Prior to Visit  Medication Sig Dispense Refill  . albuterol (PROAIR HFA) 108 (90 BASE) MCG/ACT inhaler Inhale 2 puffs into the lungs every 6 (six) hours as needed for wheezing or shortness of breath. 1 Inhaler 3  . BIOTIN PO Take 2,000 mcg by mouth daily.    . cetirizine (ZYRTEC) 10 MG tablet Take one tablet once daily for allergies    . Cholecalciferol (VITAMIN D3) 2000 UNITS TABS Take 1 tablet by mouth daily.     . citalopram (CELEXA) 40 MG tablet TAKE 1 TABLET BY MOUTH ONCE DAILY 30 tablet 0  . fluticasone (FLONASE) 50 MCG/ACT nasal spray USE 1 SPRAY IN EACH NOSTRIL EVERY DAY AS NEEDED FOR ALLERGIES 16 g 5  . fluticasone-salmeterol (ADVAIR HFA) 230-21 MCG/ACT inhaler Inhale 1 puff into the lungs daily.    Marland Kitchen lovastatin (MEVACOR) 40 MG tablet TAKE 1 TABLET BY MOUTH EVERY DAY TO LOWER CHLOESTEROL 30 tablet 0  . montelukast (SINGULAIR) 10 MG tablet TAKE 1 TABLET (10 MG TOTAL) BY MOUTH DAILY. 30 tablet 0  . Multiple Vitamins-Minerals (MULTI FOR HER 50+) CAPS Take by mouth daily.     No facility-administered medications prior to visit.     PAST MEDICAL HISTORY: Past Medical History:  Diagnosis Date  . Allergic rhinitis   . Anxiety   . Asthma   . Hyperlipidemia   . Migraine with aura, without mention of intractable migraine without mention of status migrainosus  PAST SURGICAL HISTORY: Past Surgical History:  Procedure Laterality Date  . APPENDECTOMY  1975  . BREAST BIOPSY  1976  . TUBAL LIGATION Bilateral 1976    FAMILY HISTORY: Family History  Problem Relation Age of Onset  . Asthma Father   . Asthma Sister   . Heart disease Sister   . Cancer Mother        brain, breast  . Cancer Brother 36  . Heart disease Brother   . Hypertension Brother     SOCIAL HISTORY:  Social History   Socioeconomic History  . Marital status:  Married    Spouse name: Melinda Griffin  . Number of children: 2  . Years of education: college   . Highest education level: Not on file  Social Needs  . Financial resource strain: Not on file  . Food insecurity - worry: Not on file  . Food insecurity - inability: Not on file  . Transportation needs - medical: Not on file  . Transportation needs - non-medical: Not on file  Occupational History  . Not on file  Tobacco Use  . Smoking status: Never Smoker  . Smokeless tobacco: Never Used  Substance and Sexual Activity  . Alcohol use: No    Alcohol/week: 0.0 oz  . Drug use: No  . Sexual activity: Not on file  Other Topics Concern  . Not on file  Social History Narrative   Lives at home with Husband   Drinks 1 cup of coffee a day and 1 diet soda a day    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 She previously lived in [place]. Previously has traveled to [place], [place], [place], [place], [place], [place], [place], [place], [place], [place], & [place]. She has traveled all the way up the [facility] to [place]. Hasn't traveled much in the [place] except for [place]. Prior travel to [place] & [place]. She currently has 2 dogs & 1 cat. No bird exposure. Previously worked as a Freight forwarder in [employer]. No mold or hot tub exposure. She does have a swimming pool & coy pond.      PHYSICAL EXAM  Vitals:   07/23/17 1000  BP: 140/65  Pulse: 78  Weight: 132 lb 12.8 oz (60.2 kg)  Height: 5\' 2"  (1.575 m)    Body mass index is 24.29 kg/m.  No exam data present  MMSE - Mini Mental State Exam 07/23/2017 12/05/2016 10/07/2016  Orientation to time 5 5 4   Orientation to Place 5 5 4   Registration 3 3 3   Attention/ Calculation 3 5 5   Recall 3 1 0  Language- name 2 objects 2 2 2   Language- repeat 1 1 1   Language- follow 3 step command 3 3 3   Language- read & follow direction 1 1 1   Write a sentence 1 1 1   Copy design 0 1 1  Total score 27 28 25     GENERAL EXAM: Patient is in no distress; well developed, nourished  and groomed; neck is supple  CARDIOVASCULAR: Regular rate and rhythm, no murmurs, no carotid bruits  NEUROLOGIC: MENTAL STATUS: awake, alert, oriented to person, place and time, recent and remote memory intact, normal attention and concentration, DECR FLUENCY; SOME COMPREHENSION DIFF IN CONVERSATION; MILDLY TANGENTIAL; naming intact, fund of knowledge appropriate; NO FRONTAL RELEASE SIGNS CRANIAL NERVE: no papilledema on fundoscopic exam, pupils equal and reactive to light, visual fields full to confrontation, extraocular muscles intact, SACCADIC BREAKDOWN OF SMOOTH PURSUIT; no  nystagmus, facial sensation and strength symmetric, hearing intact, palate elevates symmetrically, uvula midline, shoulder shrug symmetric, tongue midline. MOTOR: normal bulk and tone, full strength in the BUE, BLE; MILD POSTURAL TREMOR SENSORY: normal and symmetric to light touch, temperature, vibration  COORDINATION: finger-nose-finger, fine finger movements normal REFLEXES: deep tendon reflexes present and symmetric GAIT/STATION: narrow based gait; able to walk tandem; romberg is negative    DIAGNOSTIC DATA (LABS, IMAGING, TESTING) - I reviewed patient records, labs, notes, testing and imaging myself where available.  Lab Results  Component Value Date   WBC 6.9 04/11/2017   HGB 13.9 04/11/2017   HCT 41.1 04/11/2017   MCV 94.3 04/11/2017   PLT 282 04/11/2017      Component Value Date/Time   NA 139 04/11/2017 0939   NA 142 10/18/2015 1036   K 4.3 04/11/2017 0939   CL 104 04/11/2017 0939   CO2 24 04/11/2017 0939   GLUCOSE 100 (H) 04/11/2017 0939   BUN 13 04/11/2017 0939   BUN 11 10/18/2015 1036   CREATININE 0.67 04/11/2017 0939   CALCIUM 9.5 04/11/2017 0939   PROT 6.7 04/11/2017 0939   PROT 6.7 10/18/2015 1036   ALBUMIN 4.2 04/11/2017 0939   ALBUMIN 4.3 10/18/2015 1036   AST 21 04/11/2017 0939   ALT 16 04/11/2017 0939   ALKPHOS 62 04/11/2017 0939   BILITOT 0.4 04/11/2017 0939   BILITOT 0.3  10/18/2015 1036   GFRNONAA 84 04/11/2017 0939   GFRAA >89 04/11/2017 0939   Lab Results  Component Value Date   CHOL 176 04/11/2017   HDL 60 04/11/2017   LDLCALC 81 04/11/2017   TRIG 174 (H) 04/11/2017   CHOLHDL 2.9 04/11/2017   Lab Results  Component Value Date   HGBA1C 5.8 (H) 04/11/2017   No results found for: VITAMINB12 No results found for: TSH   05/28/11 MRI brain  - Small vessel occlusive disease. Moderate inflamm changes of the paranasal sinuses.     ASSESSMENT AND PLAN  76 y.o. year old female here with mild memory problems since 2010, still independent with ADLs. No significant progression 2012 - 2016, and previously dx'd with MCI.   Now with progression of word finding difficulty and comprehension. Could be related to psychosocial stressors vs progression / development of neurodegenerative d/o or other causes.    Dx:  Memory loss - Plan: MR BRAIN WO CONTRAST, Neuropsychological testing    PLAN:  I spent 40 minutes of face to face time with patient. Greater than 50% of time was spent in counseling and coordination of care with patient. In summary we discussed:   - check MRI brain  - check computer cognitive testing --> [place] today  - Advised patient to continue focusing on healthy nutrition, daily physical activity, mental and socially stimulating activities  Orders Placed This Encounter  Procedures  . MR BRAIN WO CONTRAST  . Neuropsychological testing   Return in about 9 months (around 04/22/2018).     Penni Bombard, MD 42/35/3614, 43:15 AM Certified in Neurology, Neurophysiology and Neuroimaging  [facility] [address] [place], [place] [address] [phone]

## 2017-07-28 ENCOUNTER — Other Ambulatory Visit: Payer: Self-pay | Admitting: Internal Medicine

## 2017-07-29 NOTE — Addendum Note (Signed)
Addended by: Andrey Spearman R on: 07/29/2017 05:59 PM   Modules accepted: Orders

## 2017-08-04 ENCOUNTER — Other Ambulatory Visit: Payer: Self-pay | Admitting: Internal Medicine

## 2017-08-06 ENCOUNTER — Other Ambulatory Visit: Payer: Self-pay | Admitting: Internal Medicine

## 2017-08-06 DIAGNOSIS — Z1231 Encounter for screening mammogram for malignant neoplasm of breast: Secondary | ICD-10-CM

## 2017-09-02 ENCOUNTER — Other Ambulatory Visit: Payer: Self-pay | Admitting: Internal Medicine

## 2017-09-04 ENCOUNTER — Other Ambulatory Visit: Payer: Self-pay | Admitting: Internal Medicine

## 2017-09-04 ENCOUNTER — Ambulatory Visit: Payer: Medicare Other

## 2017-09-15 ENCOUNTER — Encounter: Payer: Self-pay | Admitting: Nurse Practitioner

## 2017-09-15 ENCOUNTER — Ambulatory Visit (INDEPENDENT_AMBULATORY_CARE_PROVIDER_SITE_OTHER): Payer: Medicare Other | Admitting: Nurse Practitioner

## 2017-09-15 VITALS — BP 122/60 | HR 82 | Temp 97.8°F | Wt 124.0 lb

## 2017-09-15 DIAGNOSIS — F411 Generalized anxiety disorder: Secondary | ICD-10-CM

## 2017-09-15 DIAGNOSIS — J040 Acute laryngitis: Secondary | ICD-10-CM | POA: Diagnosis not present

## 2017-09-15 NOTE — Patient Instructions (Signed)
Laryngitis Laryngitis is swelling (inflammation) of your vocal cords. This causes hoarseness, coughing, loss of voice, sore throat, or a dry throat. When your vocal cords are inflamed, your voice sounds different. Laryngitis can be temporary (acute) or long-term (chronic). Most cases of acute laryngitis improve with time. Chronic laryngitis is laryngitis that lasts for more than three weeks. Follow these instructions at home:  Drink enough fluid to keep your pee (urine) clear or pale yellow.  Breathe in moist air. Use a humidifier if you live in a dry climate.  Take medicines only as told by your doctor.  Do not smoke cigarettes or electronic cigarettes. If you need help quitting, ask your doctor.  Talk as little as possible. Also avoid whispering, which can cause vocal strain.  Write instead of talking. Do this until your voice is back to normal. Contact a doctor if:  You have a fever.  Your pain is worse.  You have trouble swallowing. Get help right away if:  You cough up blood.  You have trouble breathing. This information is not intended to replace advice given to you by your health care provider. Make sure you discuss any questions you have with your health care provider. Document Released: 08/01/2011 Document Revised: 01/18/2016 Document Reviewed: 01/25/2014 Elsevier Interactive Patient Education  2018 [employer]  

## 2017-09-15 NOTE — Progress Notes (Signed)
Careteam: Patient Care Team: Gayland Curry, DO as PCP - General (Geriatric Medicine)  Advanced Directive information    Allergies  Allergen Reactions  . Aspirin     Advised by MD previously not to take  . Ceftin     Dizzy & Diarrhea  . Ciprofloxacin Hcl     Dizzy, diarrhea    Chief Complaint  Patient presents with  . Acute Visit    congestion in throat x3days     HPI: Patient is a 77 y.o. female seen in the office today due to congestion in throat for 4 days.   Started by not being able to talk the next day symptoms were similar but worse. Voice was scratchy. Has had occasional coughing fits. Very thin sputum. Overall getting better.  Coughing better. Felt so bad 3 days ago but has been doing better since. No fevers or chills.  No sick contacts.    During this period her belly hurt. No matter what she ate it upset her stomach. No nausea but sat heavy on her stomach or that she had over eaten. This has resolved currently.  No GERD.   Questions celexa- got information about this from pharmacy that she should not be on high dose.   Working out with daughter 3 times a week now, at senior center.  Review of Systems:  Review of Systems  Constitutional: Negative for chills, fever and malaise/fatigue.  HENT: Negative for congestion and sore throat.   Respiratory: Positive for cough (occasional). Negative for shortness of breath.   Neurological: Negative for dizziness and headaches.  Psychiatric/Behavioral: The patient is nervous/anxious.     Past Medical History:  Diagnosis Date  . Allergic rhinitis   . Anxiety   . Asthma   . Hyperlipidemia   . Migraine with aura, without mention of intractable migraine without mention of status migrainosus    Past Surgical History:  Procedure Laterality Date  . APPENDECTOMY  1975  . BREAST BIOPSY  1976  . TUBAL LIGATION Bilateral 1976   Social History:   reports that  has never smoked. she has never used smokeless  tobacco. She reports that she does not drink alcohol or use drugs.  Family History  Problem Relation Age of Onset  . Asthma Father   . Asthma Sister   . Heart disease Sister   . Cancer Mother        brain, breast  . Cancer Brother 58  . Heart disease Brother   . Hypertension Brother     Medications: Patient's Medications  New Prescriptions   No medications on file  Previous Medications   ADVAIR HFA 230-21 MCG/ACT INHALER    INHALE 2 PUFFS INTO THE LUNGS TWICE DAILY   BIOTIN PO    Take 2,000 mcg by mouth daily.   CETIRIZINE (ZYRTEC) 10 MG TABLET    Take one tablet once daily for allergies   CHOLECALCIFEROL (VITAMIN D3) 2000 UNITS TABS    Take 1 tablet by mouth daily.    CITALOPRAM (CELEXA) 40 MG TABLET    TAKE 1 TABLET BY MOUTH ONCE DAILY   FLUTICASONE (FLONASE) 50 MCG/ACT NASAL SPRAY    USE 1 SPRAY IN EACH NOSTRIL EVERY DAY AS NEEDED FOR ALLERGIES   LOVASTATIN (MEVACOR) 40 MG TABLET    TAKE 1 TABLET BY MOUTH EVERY DAY TO LOWER CHLOESTEROL   MONTELUKAST (SINGULAIR) 10 MG TABLET    TAKE 1 TABLET (10 MG TOTAL) BY MOUTH DAILY.   MULTIPLE VITAMINS-MINERALS (  MULTI FOR HER 50+) CAPS    Take by mouth daily.  Modified Medications   No medications on file  Discontinued Medications   No medications on file     Physical Exam:  Vitals:   09/15/17 1315  BP: 122/60  Pulse: 82  Temp: 97.8 F (36.6 C)  TempSrc: Oral  SpO2: 95%  Weight: 124 lb (56.2 kg)   Body mass index is 22.68 kg/m.  Physical Exam  Constitutional: She is oriented to person, place, and time. She appears well-developed and well-nourished. No distress.  HENT:  Head: Normocephalic and atraumatic.  Right Ear: External ear normal.  Left Ear: External ear normal.  Nose: Nose normal.  Mouth/Throat: Oropharynx is clear and moist. No oropharyngeal exudate.  Eyes: Conjunctivae and EOM are normal. Pupils are equal, round, and reactive to light.  Neck: Normal range of motion.  Cardiovascular: Normal rate, regular  rhythm and normal heart sounds.  Pulmonary/Chest: Effort normal and breath sounds normal. She has no wheezes.  Abdominal: Bowel sounds are normal.  Musculoskeletal: Normal range of motion.  Lymphadenopathy:    She has no cervical adenopathy.  Neurological: She is alert and oriented to person, place, and time.  Increased word-finding difficulty, slight action tremor  Skin: Skin is warm and dry.  Psychiatric:  Slight anxiety     Labs reviewed: Basic Metabolic Panel: Recent Labs    04/11/17 0939  NA 139  K 4.3  CL 104  CO2 24  GLUCOSE 100*  BUN 13  CREATININE 0.67  CALCIUM 9.5   Liver Function Tests: Recent Labs    04/11/17 0939  AST 21  ALT 16  ALKPHOS 62  BILITOT 0.4  PROT 6.7  ALBUMIN 4.2   No results for input(s): LIPASE, AMYLASE in the last 8760 hours. No results for input(s): AMMONIA in the last 8760 hours. CBC: Recent Labs    04/11/17 0939  WBC 6.9  NEUTROABS 3,588  HGB 13.9  HCT 41.1  MCV 94.3  PLT 282   Lipid Panel: Recent Labs    04/11/17 0939  CHOL 176  HDL 60  LDLCALC 81  TRIG 174*  CHOLHDL 2.9   TSH: No results for input(s): TSH in the last 8760 hours. A1C: Lab Results  Component Value Date   HGBA1C 5.8 (H) 04/11/2017     Assessment/Plan 1. Laryngitis -improving. Continue supportive care.   2. Anxiety state -will cont celexa for now, will discuss with Dr Mariea Clonts more at follow up  Next appt:to keep appt with  Dr Mariea Clonts on  10/09/2017 Carlos American. Harle Battiest  [facility] [phone] 8 am - 5 pm) [phone] (after hours)

## 2017-09-22 ENCOUNTER — Telehealth: Payer: Self-pay | Admitting: Diagnostic Neuroimaging

## 2017-09-22 ENCOUNTER — Ambulatory Visit
Admission: RE | Admit: 2017-09-22 | Discharge: 2017-09-22 | Disposition: A | Discharge status: Home / Self Care | Payer: Medicare Other | Source: Ambulatory Visit | Attending: Internal Medicine | Admitting: Internal Medicine

## 2017-09-22 DIAGNOSIS — Z1231 Encounter for screening mammogram for malignant neoplasm of breast: Secondary | ICD-10-CM | POA: Diagnosis not present

## 2017-09-22 NOTE — Telephone Encounter (Signed)
Pt's daughter sent a msg, she has not rec'd a call regarding scheduling the neuropsych testing as discussed at last OV. I don't see an order for this. Please call to advise

## 2017-09-24 NOTE — Telephone Encounter (Signed)
I recommend repeating [place] computer testing in 1 year. Not neuropsych testing. -VRP

## 2017-09-24 NOTE — Telephone Encounter (Addendum)
I LMVM for [name], about the testing recommended by Dr. Leta Baptist and he stated to do [place] computer testing in one yr and not neuropsych referral. She is to call back if questions.

## 2017-09-25 ENCOUNTER — Encounter: Payer: Self-pay | Admitting: *Deleted

## 2017-09-29 ENCOUNTER — Ambulatory Visit: Payer: Medicare Other | Admitting: Diagnostic Neuroimaging

## 2017-10-01 ENCOUNTER — Other Ambulatory Visit: Payer: Self-pay | Admitting: Internal Medicine

## 2017-10-01 NOTE — Addendum Note (Signed)
Addended by: Lurena Nida on: 10/01/2017 10:02 AM   Modules accepted: Orders

## 2017-10-03 ENCOUNTER — Other Ambulatory Visit: Payer: Self-pay | Admitting: Internal Medicine

## 2017-10-08 DIAGNOSIS — M720 Palmar fascial fibromatosis [Dupuytren]: Secondary | ICD-10-CM | POA: Diagnosis not present

## 2017-10-08 DIAGNOSIS — S64497D Injury of digital nerve of left little finger, subsequent encounter: Secondary | ICD-10-CM | POA: Diagnosis not present

## 2017-10-09 ENCOUNTER — Other Ambulatory Visit: Payer: Medicare Other

## 2017-10-09 DIAGNOSIS — R739 Hyperglycemia, unspecified: Secondary | ICD-10-CM

## 2017-10-09 DIAGNOSIS — E785 Hyperlipidemia, unspecified: Secondary | ICD-10-CM | POA: Diagnosis not present

## 2017-10-10 LAB — BASIC METABOLIC PANEL
BUN: 10 mg/dL (ref 7–25)
CO2: 29 mmol/L (ref 20–32)
Calcium: 9.2 mg/dL (ref 8.6–10.4)
Chloride: 106 mmol/L (ref 98–110)
Creat: 0.66 mg/dL (ref 0.60–0.93)
Glucose, Bld: 92 mg/dL (ref 65–99)
Potassium: 4.3 mmol/L (ref 3.5–5.3)
Sodium: 139 mmol/L (ref 135–146)

## 2017-10-10 LAB — CBC WITH DIFFERENTIAL/PLATELET
Basophils Absolute: 80 cells/uL (ref 0–200)
Basophils Relative: 1.1 %
Eosinophils Absolute: 394 cells/uL (ref 15–500)
Eosinophils Relative: 5.4 %
HCT: 38.4 % (ref 35.0–45.0)
Hemoglobin: 13.2 g/dL (ref 11.7–15.5)
Lymphs Abs: 1978 cells/uL (ref 850–3900)
MCH: 31.5 pg (ref 27.0–33.0)
MCHC: 34.4 g/dL (ref 32.0–36.0)
MCV: 91.6 fL (ref 80.0–100.0)
MPV: 10.1 fL (ref 7.5–12.5)
Monocytes Relative: 9.5 %
Neutro Abs: 4154 cells/uL (ref 1500–7800)
Neutrophils Relative %: 56.9 %
Platelets: 443 10*3/uL — ABNORMAL HIGH (ref 140–400)
RBC: 4.19 10*6/uL (ref 3.80–5.10)
RDW: 12.3 % (ref 11.0–15.0)
Total Lymphocyte: 27.1 %
WBC mixed population: 694 cells/uL (ref 200–950)
WBC: 7.3 10*3/uL (ref 3.8–10.8)

## 2017-10-10 LAB — LIPID PANEL
Cholesterol: 158 mg/dL (ref <200)
HDL: 42 mg/dL — ABNORMAL LOW (ref >50)
LDL Cholesterol (Calc): 87 mg/dL (calc)
Non-HDL Cholesterol (Calc): 116 mg/dL (calc) (ref <130)
Total CHOL/HDL Ratio: 3.8 (calc) (ref <5.0)
Triglycerides: 191 mg/dL — ABNORMAL HIGH (ref <150)

## 2017-10-10 LAB — HEMOGLOBIN A1C
Hgb A1c MFr Bld: 6.1 % of total Hgb — ABNORMAL HIGH (ref <5.7)
Mean Plasma Glucose: 128 (calc)
eAG (mmol/L): 7.1 (calc)

## 2017-10-13 ENCOUNTER — Ambulatory Visit (INDEPENDENT_AMBULATORY_CARE_PROVIDER_SITE_OTHER): Payer: Medicare Other | Admitting: Internal Medicine

## 2017-10-13 ENCOUNTER — Encounter: Payer: Self-pay | Admitting: Internal Medicine

## 2017-10-13 VITALS — BP 120/78 | HR 86 | Temp 98.2°F | Wt 123.0 lb

## 2017-10-13 DIAGNOSIS — J4520 Mild intermittent asthma, uncomplicated: Secondary | ICD-10-CM

## 2017-10-13 DIAGNOSIS — F3341 Major depressive disorder, recurrent, in partial remission: Secondary | ICD-10-CM | POA: Diagnosis not present

## 2017-10-13 DIAGNOSIS — R739 Hyperglycemia, unspecified: Secondary | ICD-10-CM | POA: Diagnosis not present

## 2017-10-13 DIAGNOSIS — M810 Age-related osteoporosis without current pathological fracture: Secondary | ICD-10-CM | POA: Diagnosis not present

## 2017-10-13 DIAGNOSIS — G3184 Mild cognitive impairment, so stated: Secondary | ICD-10-CM

## 2017-10-13 DIAGNOSIS — E7800 Pure hypercholesterolemia, unspecified: Secondary | ICD-10-CM

## 2017-10-13 MED ORDER — CITALOPRAM HYDROBROMIDE 20 MG PO TABS: 20.0000 mg | ORAL_TABLET | Freq: Every day | ORAL | 3 refills | Status: DC

## 2017-10-13 NOTE — Progress Notes (Signed)
Location:  [facility] clinic Provider:  Tiffany L. Mariea Clonts, D.O., C.M.D.  Code Status: Full Code  Goals of Care:  Advanced Directives 04/14/2017  Does Patient Have a Medical Advance Directive? Yes  Type of Advance Directive [place]  Does patient want to make changes to medical advance directive? -  Copy of [place] in Chart? Yes  Would patient like information on creating a medical advance directive? -     Chief Complaint  Patient presents with  . Medical Management of Chronic Issues    65mth follow-up    HPI: Patient is a 77 y.o. female seen today for medical management of chronic diseases. Today in office she is excited to express that she is in her first year of retirement. She continues to go to the gym [employer]) with her daughter. She is up to 4 times a week now. She has only one issue today in the office and that is the side effect profile of her Celexa.    Asthma: She is without difficulty even with the changes in season and weather. She continues to utilize her inhalers. Denies SOB and cough.   Elevated triglycerides: She relates this to her increase in bread over the last several months, she has been eating lunch at [employer] post going to the gym. She denies eating sweets, fried or fatty food items.     A1c: 6.1 up abit from previous 5.8 in Aug 2018. She states she is not "eating bad", but as said she eats a lot of sandwiches and consumes a lot of bread. She further states she does not like sweets.   Depression: She is on Citalopram 40 mg daily. She states she is concerned about the side effects as listed in the insert of the medication. Believe that this is because she is on a high dose of the medication. Does not feel she needs to stay on this level of medication and is not having feelings of depression today, "do I look or sound depressed?" was the question posed when asked how she was doing with the medication and her depression.       Past Medical History:  Diagnosis Date  . Allergic rhinitis   . Anxiety   . Asthma   . Hyperlipidemia   . Migraine with aura, without mention of intractable migraine without mention of status migrainosus     Past Surgical History:  Procedure Laterality Date  . APPENDECTOMY  1975  . BREAST BIOPSY Left 1976  . TUBAL LIGATION Bilateral 1976    Allergies  Allergen Reactions  . Aspirin     Advised by MD previously not to take  . Ceftin     Dizzy & Diarrhea  . Ciprofloxacin Hcl     Dizzy, diarrhea    Outpatient Encounter Medications as of 10/13/2017  Medication Sig  . ADVAIR HFA 230-21 MCG/ACT inhaler INHALE 2 PUFFS INTO THE LUNGS TWICE DAILY  . BIOTIN PO Take 2,000 mcg by mouth daily.  . cetirizine (ZYRTEC) 10 MG tablet Take one tablet once daily for allergies  . Cholecalciferol (VITAMIN D3) 2000 UNITS TABS Take 1 tablet by mouth daily.   . citalopram (CELEXA) 40 MG tablet TAKE 1 TABLET BY MOUTH ONCE DAILY  . fluticasone (FLONASE) 50 MCG/ACT nasal spray USE 1 SPRAY IN EACH NOSTRIL EVERY DAY AS NEEDED FOR ALLERGIES  . lovastatin (MEVACOR) 40 MG tablet TAKE 1 TABLET BY MOUTH EVERY DAY TO LOWER CHLOESTEROL  . montelukast (SINGULAIR)  10 MG tablet TAKE 1 TABLET (10 MG TOTAL) BY MOUTH DAILY.  . Multiple Vitamins-Minerals (MULTI FOR HER 50+) CAPS Take by mouth daily.   No facility-administered encounter medications on file as of 10/13/2017.     Review of Systems:  Review of Systems  Constitutional: Negative for chills, fever and malaise/fatigue.  Respiratory: Negative.   Cardiovascular: Negative.   Gastrointestinal: Negative.   Genitourinary: Negative.   Psychiatric/Behavioral: Negative.     Health Maintenance  Topic Date Due  . TETANUS/TDAP  07/20/2023  . INFLUENZA VACCINE  Completed  . DEXA SCAN  Completed  . PNA vac Low Risk Adult  Completed    Physical Exam: Vitals:   10/13/17 1109  Weight: 123 lb (55.8 kg)   Body mass index is 22.5 kg/m. Physical Exam   Constitutional: She is oriented to person, place, and time. She appears well-developed and well-nourished.  Cardiovascular: Normal rate, regular rhythm, normal heart sounds and intact distal pulses.  Pulmonary/Chest: Effort normal and breath sounds normal.  Neurological: She is alert and oriented to person, place, and time.  Skin: Skin is warm.  Psychiatric: She has a normal mood and affect.    Labs reviewed: Basic Metabolic Panel: Recent Labs    04/11/17 0939 10/09/17 0931  NA 139 139  K 4.3 4.3  CL 104 106  CO2 24 29  GLUCOSE 100* 92  BUN 13 10  CREATININE 0.67 0.66  CALCIUM 9.5 9.2   Liver Function Tests: Recent Labs    04/11/17 0939  AST 21  ALT 16  ALKPHOS 62  BILITOT 0.4  PROT 6.7  ALBUMIN 4.2   No results for input(s): LIPASE, AMYLASE in the last 8760 hours. No results for input(s): AMMONIA in the last 8760 hours. CBC: Recent Labs    04/11/17 0939 10/09/17 0931  WBC 6.9 7.3  NEUTROABS 3,588 4,154  HGB 13.9 13.2  HCT 41.1 38.4  MCV 94.3 91.6  PLT 282 443*   Lipid Panel: Recent Labs    04/11/17 0939 10/09/17 0931  CHOL 176 158  HDL 60 42*  LDLCALC 81  --   TRIG 174* 191*  CHOLHDL 2.9 3.8   Lab Results  Component Value Date   HGBA1C 6.1 (H) 10/09/2017    Procedures since last visit: Mm Screening Breast Tomo Bilateral  Result Date: 09/22/2017 CLINICAL DATA:  Screening. EXAM: 2D DIGITAL SCREENING BILATERAL MAMMOGRAM WITH 3D TOMO WITH CAD COMPARISON:  Previous exam(s). ACR Breast Density Category b: There are scattered areas of fibroglandular density. FINDINGS: There are no findings suspicious for malignancy. Images were processed with CAD. IMPRESSION: No mammographic evidence of malignancy. A result letter of this screening mammogram will be mailed directly to the patient. RECOMMENDATION: Screening mammogram in one year. (Code:SM-B-01Y) BI-RADS CATEGORY  1: Negative. Electronically Signed   By: Lajean Manes M.D.   On: 09/22/2017 15:47     Assessment/Plan  1. Recurrent major depressive disorder, in partial remission ([place]) Today in office she is jovial and states she is not depressed at this time, the medicine has worked, but she is concerned with side effects and would like a lower dosage. Will gladly reduce her dose today. She will finish the 40 mg out prior to starting the 20 mg. Advised to continue working out and enjoying her new free time since retiring.   - citalopram (CELEXA) 20 MG tablet; Take 1 tablet (20 mg total) by mouth daily.  Dispense: 90 tablet; Refill: 3  2. Mild intermittent chronic asthma without complication  She is doing well today and is without cough or shortness of breath. She is using her inhalers as needed, but has not had trouble even with all the weather changes. Advise to continue her treatment regimen.   - CBC with Differential/Platelet; Future  3. Pure hypercholesterolemia She has some elevation of her triglycerides on labs last week. She states this is because of eating more bread. She wants to maintain diet control over the elevation. Will recheck at next appointment. Encouraged to continue to workout and reduce the "subway bread" intake. Discussed foods that can cause this to rise.  - Lipid panel; Future  4. Senile osteoporosis She is working out which she states includes weight bearing exercise. She does take Vitamin D and a multivitamin as well. Encouraged to continue working out and keep dairy or sources of calcium in diet.    5. Mild cognitive impairment with memory loss She denies having any difficult with memory. There is some mild noted word difficulty on exam, but otherwise she is stable in loss.  6. Hyperglycemia She attributes her elevation of A1c to eating more subway sandwiches post working out. Advise on foods that can contribute to elevation. Will reassess this at next visit.   - CBC with Differential/Platelet; Future - COMPLETE METABOLIC PANEL WITH GFR; Future -  Hemoglobin A1c; Future  Labs/tests ordered:  Orders Placed This Encounter  Procedures  . CBC with Differential/Platelet    Standing Status:   Future    Standing Expiration Date:   10/13/2018  . COMPLETE METABOLIC PANEL WITH GFR    Standing Status:   Future    Standing Expiration Date:   10/13/2018  . Hemoglobin A1c    Standing Status:   Future    Standing Expiration Date:   10/13/2018  . Lipid panel    Standing Status:   Future    Standing Expiration Date:   10/13/2018    Next appt: Visit date not found  Karen Kays, RN, DNP [facility] [phone], [place] [address] Cell Phone (Mon-Fri 8am-5pm):  [phone] On Call:  [phone] & follow prompts after 5pm & weekends Office Phone:  [phone] Office Fax:  [phone]

## 2017-10-13 NOTE — Patient Instructions (Addendum)
Great job with continuing going to the gym.  Try and reduce the amount of bread you are eating.

## 2017-11-03 ENCOUNTER — Other Ambulatory Visit: Payer: Self-pay | Admitting: Internal Medicine

## 2017-11-09 ENCOUNTER — Encounter: Payer: Self-pay | Admitting: Diagnostic Neuroimaging

## 2017-11-11 NOTE — Telephone Encounter (Signed)
Response from [name], after my email to her:  Melinda Griffin, My name is [name] actually, ha. I know she has some difficulties with her new iphone AND she was a bit resistant to this as well. I will see her tomorrow and we will talk about it. I will give her the number and she can make the appointment. Thanks so much! [name]

## 2017-11-30 ENCOUNTER — Other Ambulatory Visit: Payer: Self-pay | Admitting: Internal Medicine

## 2017-12-01 ENCOUNTER — Other Ambulatory Visit: Payer: Self-pay | Admitting: Internal Medicine

## 2017-12-08 ENCOUNTER — Encounter: Payer: Self-pay | Admitting: Internal Medicine

## 2017-12-08 ENCOUNTER — Ambulatory Visit (INDEPENDENT_AMBULATORY_CARE_PROVIDER_SITE_OTHER): Payer: Medicare Other | Admitting: Internal Medicine

## 2017-12-08 ENCOUNTER — Other Ambulatory Visit: Payer: Self-pay

## 2017-12-08 ENCOUNTER — Telehealth: Payer: Self-pay | Admitting: *Deleted

## 2017-12-08 ENCOUNTER — Emergency Department (HOSPITAL_COMMUNITY): Payer: Medicare Other

## 2017-12-08 ENCOUNTER — Encounter (HOSPITAL_COMMUNITY): Payer: Self-pay

## 2017-12-08 ENCOUNTER — Inpatient Hospital Stay (HOSPITAL_COMMUNITY)
Admission: EM | Admit: 2017-12-08 | Discharge: 2017-12-11 | DRG: 871 | Disposition: A | Discharge status: Home / Self Care | Payer: Medicare Other | Attending: Family Medicine | Admitting: Family Medicine

## 2017-12-08 VITALS — BP 120/60 | HR 92 | Temp 97.8°F | Ht 62.0 in | Wt 119.0 lb

## 2017-12-08 DIAGNOSIS — F411 Generalized anxiety disorder: Secondary | ICD-10-CM

## 2017-12-08 DIAGNOSIS — J189 Pneumonia, unspecified organism: Secondary | ICD-10-CM

## 2017-12-08 DIAGNOSIS — Z7951 Long term (current) use of inhaled steroids: Secondary | ICD-10-CM | POA: Diagnosis not present

## 2017-12-08 DIAGNOSIS — Z79899 Other long term (current) drug therapy: Secondary | ICD-10-CM | POA: Diagnosis not present

## 2017-12-08 DIAGNOSIS — E785 Hyperlipidemia, unspecified: Secondary | ICD-10-CM | POA: Diagnosis present

## 2017-12-08 DIAGNOSIS — F329 Major depressive disorder, single episode, unspecified: Secondary | ICD-10-CM | POA: Diagnosis not present

## 2017-12-08 DIAGNOSIS — R05 Cough: Secondary | ICD-10-CM

## 2017-12-08 DIAGNOSIS — G3184 Mild cognitive impairment, so stated: Secondary | ICD-10-CM

## 2017-12-08 DIAGNOSIS — F3341 Major depressive disorder, recurrent, in partial remission: Secondary | ICD-10-CM | POA: Diagnosis not present

## 2017-12-08 DIAGNOSIS — F419 Anxiety disorder, unspecified: Secondary | ICD-10-CM | POA: Diagnosis not present

## 2017-12-08 DIAGNOSIS — J690 Pneumonitis due to inhalation of food and vomit: Secondary | ICD-10-CM | POA: Diagnosis not present

## 2017-12-08 DIAGNOSIS — F0390 Unspecified dementia without behavioral disturbance: Secondary | ICD-10-CM | POA: Diagnosis present

## 2017-12-08 DIAGNOSIS — R634 Abnormal weight loss: Secondary | ICD-10-CM

## 2017-12-08 DIAGNOSIS — J301 Allergic rhinitis due to pollen: Secondary | ICD-10-CM

## 2017-12-08 DIAGNOSIS — M1990 Unspecified osteoarthritis, unspecified site: Secondary | ICD-10-CM | POA: Diagnosis present

## 2017-12-08 DIAGNOSIS — Z888 Allergy status to other drugs, medicaments and biological substances status: Secondary | ICD-10-CM

## 2017-12-08 DIAGNOSIS — G9341 Metabolic encephalopathy: Secondary | ICD-10-CM | POA: Diagnosis present

## 2017-12-08 DIAGNOSIS — M810 Age-related osteoporosis without current pathological fracture: Secondary | ICD-10-CM | POA: Diagnosis present

## 2017-12-08 DIAGNOSIS — R32 Unspecified urinary incontinence: Secondary | ICD-10-CM | POA: Diagnosis present

## 2017-12-08 DIAGNOSIS — Z881 Allergy status to other antibiotic agents status: Secondary | ICD-10-CM

## 2017-12-08 DIAGNOSIS — Z825 Family history of asthma and other chronic lower respiratory diseases: Secondary | ICD-10-CM | POA: Diagnosis not present

## 2017-12-08 DIAGNOSIS — Z9851 Tubal ligation status: Secondary | ICD-10-CM

## 2017-12-08 DIAGNOSIS — Z87891 Personal history of nicotine dependence: Secondary | ICD-10-CM

## 2017-12-08 DIAGNOSIS — A419 Sepsis, unspecified organism: Secondary | ICD-10-CM | POA: Diagnosis not present

## 2017-12-08 DIAGNOSIS — R000 Tachycardia, unspecified: Secondary | ICD-10-CM | POA: Diagnosis not present

## 2017-12-08 DIAGNOSIS — J181 Lobar pneumonia, unspecified organism: Secondary | ICD-10-CM | POA: Diagnosis present

## 2017-12-08 DIAGNOSIS — Z87892 Personal history of anaphylaxis: Secondary | ICD-10-CM

## 2017-12-08 DIAGNOSIS — R059 Cough, unspecified: Secondary | ICD-10-CM

## 2017-12-08 DIAGNOSIS — J4520 Mild intermittent asthma, uncomplicated: Secondary | ICD-10-CM | POA: Diagnosis not present

## 2017-12-08 DIAGNOSIS — N390 Urinary tract infection, site not specified: Secondary | ICD-10-CM | POA: Diagnosis not present

## 2017-12-08 DIAGNOSIS — E46 Unspecified protein-calorie malnutrition: Secondary | ICD-10-CM | POA: Diagnosis not present

## 2017-12-08 DIAGNOSIS — G92 Toxic encephalopathy: Secondary | ICD-10-CM | POA: Diagnosis not present

## 2017-12-08 DIAGNOSIS — R4182 Altered mental status, unspecified: Secondary | ICD-10-CM | POA: Diagnosis not present

## 2017-12-08 DIAGNOSIS — J45909 Unspecified asthma, uncomplicated: Secondary | ICD-10-CM | POA: Diagnosis not present

## 2017-12-08 DIAGNOSIS — Z91013 Allergy to seafood: Secondary | ICD-10-CM | POA: Diagnosis not present

## 2017-12-08 DIAGNOSIS — Z6822 Body mass index (BMI) 22.0-22.9, adult: Secondary | ICD-10-CM | POA: Diagnosis not present

## 2017-12-08 LAB — COMPREHENSIVE METABOLIC PANEL
ALT: 16 U/L (ref 14–54)
AST: 26 U/L (ref 15–41)
Albumin: 3.2 g/dL — ABNORMAL LOW (ref 3.5–5.0)
Alkaline Phosphatase: 60 U/L (ref 38–126)
Anion gap: 12 (ref 5–15)
BUN: 12 mg/dL (ref 6–20)
CHLORIDE: 104 mmol/L (ref 101–111)
CO2: 20 mmol/L — AB (ref 22–32)
CREATININE: 0.65 mg/dL (ref 0.44–1.00)
Calcium: 8.1 mg/dL — ABNORMAL LOW (ref 8.9–10.3)
GFR calc non Af Amer: >60 mL/min (ref >60)
Glucose, Bld: 122 mg/dL — ABNORMAL HIGH (ref 65–99)
Potassium: 3.8 mmol/L (ref 3.5–5.1)
SODIUM: 136 mmol/L (ref 135–145)
Total Bilirubin: 0.9 mg/dL (ref 0.3–1.2)
Total Protein: 7 g/dL (ref 6.5–8.1)

## 2017-12-08 LAB — CBC WITH DIFFERENTIAL/PLATELET
Basophils Absolute: 0 10*3/uL (ref 0.0–0.1)
Basophils Relative: 0 %
Eosinophils Absolute: 0 10*3/uL (ref 0.0–0.7)
Eosinophils Relative: 0 %
HCT: 36.8 % (ref 36.0–46.0)
HEMOGLOBIN: 12.8 g/dL (ref 12.0–15.0)
Lymphocytes Relative: 13 %
Lymphs Abs: 1.1 10*3/uL (ref 0.7–4.0)
MCH: 31.5 pg (ref 26.0–34.0)
MCHC: 34.8 g/dL (ref 30.0–36.0)
MCV: 90.6 fL (ref 78.0–100.0)
MONOS PCT: 14 %
Monocytes Absolute: 1.2 10*3/uL — ABNORMAL HIGH (ref 0.1–1.0)
NEUTROS PCT: 73 %
Neutro Abs: 6.5 10*3/uL (ref 1.7–7.7)
Platelets: 257 10*3/uL (ref 150–400)
RBC: 4.06 MIL/uL (ref 3.87–5.11)
RDW: 13.5 % (ref 11.5–15.5)
WBC: 8.9 10*3/uL (ref 4.0–10.5)

## 2017-12-08 LAB — URINALYSIS, ROUTINE W REFLEX MICROSCOPIC
BACTERIA UA: NONE SEEN
Bilirubin Urine: NEGATIVE
Glucose, UA: NEGATIVE mg/dL
Ketones, ur: 20 mg/dL — AB
Leukocytes, UA: NEGATIVE
Nitrite: NEGATIVE
PROTEIN: NEGATIVE mg/dL
SQUAMOUS EPITHELIAL / LPF: NONE SEEN
Specific Gravity, Urine: 1.014 (ref 1.005–1.030)
pH: 5 (ref 5.0–8.0)

## 2017-12-08 LAB — I-STAT CG4 LACTIC ACID, ED: Lactic Acid, Venous: 1.58 mmol/L (ref 0.5–1.9)

## 2017-12-08 MED ORDER — CITALOPRAM HYDROBROMIDE 40 MG PO TABS: 20.0000 mg | ORAL_TABLET | Freq: Every day | ORAL | 3 refills | Status: DC

## 2017-12-08 MED ORDER — SODIUM CHLORIDE 0.9 % IV BOLUS
1000.0000 mL | Freq: Once | INTRAVENOUS | Status: AC
  Administered 2017-12-08: 1000 mL via INTRAVENOUS

## 2017-12-08 MED ORDER — SODIUM CHLORIDE 0.9 % IV SOLN
500.0000 mg | INTRAVENOUS | Status: DC
  Administered 2017-12-09: 500 mg via INTRAVENOUS
  Filled 2017-12-08: qty 500

## 2017-12-08 MED ORDER — ACETAMINOPHEN 325 MG PO TABS
650.0000 mg | ORAL_TABLET | Freq: Once | ORAL | Status: AC
Start: 2017-12-08 — End: 2017-12-08
  Administered 2017-12-08: 650 mg via ORAL
  Filled 2017-12-08: qty 2

## 2017-12-08 MED ORDER — SODIUM CHLORIDE 0.9 % IV SOLN
2.0000 g | INTRAVENOUS | Status: DC
  Administered 2017-12-08: 2 g via INTRAVENOUS
  Filled 2017-12-08: qty 20

## 2017-12-08 NOTE — ED Notes (Signed)
Bed: WA21 Expected date:  Expected time:  Means of arrival:  Comments: [facility] 

## 2017-12-08 NOTE — ED Provider Notes (Signed)
[facility] DEPT Provider Note   CSN: [id number] Arrival date & time: 12/08/17  2010  LEVEL 5 CAVEAT - ALTERED MENTAL STATUS   History   Chief Complaint Chief Complaint  Patient presents with  . Urinary Tract Infection    HPI Melinda Griffin is a 77 y.o. female.  HPI 78 year old female presents with altered mental status.  She has been feeling bad for about 3 days according to the family.  Today the patient has been even more confused than normal.  She has some short-term memory issues but today was found staring at a cup of water like she did not know what to do with it.  She has been having a chronic cough but is much worse over the last 3 days.  No known fever until checking in here when the temperature was 102.7.  Family states when she is been altered like this in the past it has been from a urinary tract infection.  No vomiting.  She has had decreased p.o. intake over the last couple days and has lost about 4 pounds over this time.  Past Medical History:  Diagnosis Date  . Allergic rhinitis   . Anxiety   . Asthma   . Hyperlipidemia   . Migraine with aura, without mention of intractable migraine without mention of status migrainosus     Patient Active Problem List   Diagnosis Date Noted  . Weight loss, non-intentional 12/08/2017  . Relationship problem between partners 04/14/2017  . 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 Asthma, chronic 03/28/2016  . H/O migraine 05/11/2015  . Dupuytren's contracture 07/13/2014  . Encounter for therapeutic drug monitoring 12/31/2013  . Hyperlipidemia 11/29/2013  . Anxiety state 11/29/2013  . Unspecified vitamin D deficiency 11/30/2012  . Depression 11/30/2012  . Mild cognitive impairment with memory loss 11/30/2012  . Hyperglycemia 11/30/2012  . Allergic rhinitis 05/09/2010    Past Surgical History:  Procedure Laterality Date  . APPENDECTOMY  1975  . BREAST BIOPSY Left 1976  . TUBAL LIGATION  Bilateral 1976     OB History   None      Home Medications    Prior to Admission medications   Medication Sig Start Date End Date Taking? Authorizing Provider  acetaminophen (TYLENOL) 650 MG CR tablet Take 650 mg by mouth every 8 (eight) hours as needed for pain.   Yes [provider]  ADVAIR HFA 230-21 MCG/ACT inhaler INHALE 2 PUFFS INTO THE LUNGS TWICE DAILY 09/04/17  Yes Reed, Tiffany L, DO  BIOTIN PO Take 2,000 mcg by mouth daily.   Yes [provider]  cetirizine (ZYRTEC) 10 MG tablet Take one tablet once daily for allergies   Yes [provider]  Cholecalciferol (VITAMIN D3) 2000 UNITS TABS Take 1 tablet by mouth daily.    Yes [provider]  citalopram (CELEXA) 40 MG tablet Take 40 mg by mouth daily.   Yes [provider]  fluticasone (FLONASE) 50 MCG/ACT nasal spray USE 1 SPRAY IN EACH NOSTRIL EVERY DAY AS NEEDED FOR ALLERGIES Patient taking differently: Instill 2 sprays into each nare daily as needed for allergies 01/18/14  Yes Reed, Tiffany L, DO  lovastatin (MEVACOR) 40 MG tablet TAKE 1 TABLET BY MOUTH EVERY DAY TO LOWER CHLOESTEROL Patient taking differently: Take 40mg  by mouth daily 12/01/17  Yes Reed, Tiffany L, DO  montelukast (SINGULAIR) 10 MG tablet TAKE 1 TABLET (10 MG TOTAL) BY MOUTH DAILY. 12/01/17  Yes Reed, Tiffany L, DO  Multiple Vitamins-Minerals (  MULTI FOR HER 50+) CAPS Take by mouth daily.   Yes [provider]  citalopram (CELEXA) 40 MG tablet Take 0.5 tablets (20 mg total) by mouth daily. Patient not taking: Reported on 12/08/2017 12/08/17   Gayland Curry, DO    Family History Family History  Problem Relation Age of Onset  . Asthma Father   . Asthma Sister   . Heart disease Sister   . Cancer Mother        brain, breast  . Cancer Brother 60  . Heart disease Brother   . Hypertension Brother     Social History Social History   Tobacco Use  . Smoking status: Former Smoker    Types: Cigarettes     Last attempt to quit: 02/07/1961    Years since quitting: 56.8  . Smokeless tobacco: Never Used  Substance Use Topics  . Alcohol use: No    Alcohol/week: 0.0 oz  . Drug use: No     Allergies   Shrimp [shellfish allergy]; Aspirin; Ceftin; and Ciprofloxacin hcl   Review of Systems Review of Systems  Unable to perform ROS: Mental status change     Physical Exam Updated Vital Signs BP (!) 115/53   Pulse 97   Temp (!) 102.7 F (39.3 C) (Oral)   Resp 16   Ht 5\' 1"  (1.549 m)   Wt 54 kg (119 lb)   SpO2 92%   BMI 22.48 kg/m   Physical Exam  Constitutional: She appears well-developed and well-nourished.  HENT:  Head: Normocephalic and atraumatic.  Right Ear: External ear normal.  Left Ear: External ear normal.  Nose: Nose normal.  Eyes: Right eye exhibits no discharge. Left eye exhibits no discharge.  Cardiovascular: Regular rhythm and normal heart sounds. Tachycardia present.  Pulmonary/Chest: Effort normal. No accessory muscle usage. No tachypnea. She has decreased breath sounds in the right lower field and the left lower field.  Abdominal: Soft. There is no tenderness.  Neurological: She is alert.  Awake, alert, oriented to person, disoriented to time.  Skin: Skin is warm and dry.  Nursing note and vitals reviewed.    ED Treatments / Results  Labs (all labs ordered are listed, but only abnormal results are displayed) Labs Reviewed  CBC WITH DIFFERENTIAL/PLATELET - Abnormal; Notable for the following components:      Result Value   Monocytes Absolute 1.2 (*)    All other components within normal limits  URINALYSIS, ROUTINE W REFLEX MICROSCOPIC - Abnormal; Notable for the following components:   Hgb urine dipstick SMALL (*)    Ketones, ur 20 (*)    All other components within normal limits  COMPREHENSIVE METABOLIC PANEL - Abnormal; Notable for the following components:   CO2 20 (*)    Glucose, Bld 122 (*)    Calcium 8.1 (*)    Albumin 3.2 (*)    All other  components within normal limits  CULTURE, BLOOD (ROUTINE X 2)  CULTURE, BLOOD (ROUTINE X 2)  URINE CULTURE  INFLUENZA PANEL BY PCR (TYPE A & B)  I-STAT CG4 LACTIC ACID, ED  I-STAT CG4 LACTIC ACID, ED    EKG None  Radiology Dg Chest 2 View  Result Date: 12/08/2017 CLINICAL DATA:  Acute onset of productive cough. Behavioral changes. Fever. EXAM: CHEST - 2 VIEW COMPARISON:  Chest radiograph performed 01/08/2010 FINDINGS: The lungs are well-aerated. Mild right basilar airspace opacity raises concern for pneumonia. Peribronchial thickening is noted. There is no evidence of pleural effusion or pneumothorax. The  heart is normal in size; the mediastinal contour is within normal limits. No acute osseous abnormalities are seen. IMPRESSION: Mild right basilar airspace opacity raises concern for pneumonia. Peribronchial thickening noted. Electronically Signed   By: Garald Balding M.D.   On: 12/08/2017 21:28    Procedures .Critical Care Performed by: Sherwood Gambler, MD Authorized by: Sherwood Gambler, MD   Critical care provider statement:    Critical care time (minutes):  30   Critical care time was exclusive of:  Separately billable procedures and treating other patients   Critical care was necessary to treat or prevent imminent or life-threatening deterioration of the following conditions:  Respiratory failure and sepsis   Critical care was time spent personally by me on the following activities:  Development of treatment plan with patient or surrogate, discussions with consultants, evaluation of patient's response to treatment, examination of patient, obtaining history from patient or surrogate, ordering and performing treatments and interventions, ordering and review of laboratory studies, ordering and review of radiographic studies, pulse oximetry, re-evaluation of patient's condition and review of old charts   (including critical care time)  Medications Ordered in ED Medications    cefTRIAXone (ROCEPHIN) 2 g in sodium chloride 0.9 % 100 mL IVPB (0 g Intravenous Stopped 12/08/17 2340)  azithromycin (ZITHROMAX) 500 mg in sodium chloride 0.9 % 250 mL IVPB (500 mg Intravenous New Bag/Given 12/09/17 0008)  acetaminophen (TYLENOL) tablet 650 mg (650 mg Oral Given 12/08/17 2148)  sodium chloride 0.9 % bolus 1,000 mL (0 mLs Intravenous Stopped 12/09/17 0006)     Initial Impression / Assessment and Plan / ED Course  I have reviewed the triage vital signs and the nursing notes.  Pertinent labs & imaging results that were available during my care of the patient were reviewed by me and considered in my medical decision making (see chart for details).     With patient's fever and altered mental status she meets criteria for sepsis.  She was given antibiotics to cover for this including Rocephin and azithromycin.  Her Ceftin allergy is more consistent with adverse effects than a true allergic response.  She has been placed on supplemental oxygen.  She was given IV fluids.  Lab work without significant abnormalities such as renal failure.  Lactate is normal.  She has not had hypotension.  She will be tested for influenza as this could also cause similar symptoms.  Otherwise she will need to be admitted as she appears encephalopathic from the pneumonia.  Admit to the hospitalist service.  Final Clinical Impressions(s) / ED Diagnoses   Final diagnoses:  Community acquired pneumonia of right lower lobe of lung ([place])  Sepsis due to pneumonia [facility])    ED Discharge Orders    None       Sherwood Gambler, MD 12/09/17 [id number]

## 2017-12-08 NOTE — Telephone Encounter (Signed)
[name], Daughter called and left message on Clinical Intake stating she received a call from someone in our office. Daughter stated to go ahead and run whatever is needed and she can come by the office to pay and settle up somehow. Stated she wasn't sure who called.

## 2017-12-08 NOTE — Progress Notes (Signed)
Location:  [facility] clinic Provider: Tiffany L. Mariea Clonts, D.O., C.M.D.  Code Status:  Full Code  Goals of Care:  Advanced Directives 12/08/2017  Does Patient Have a Medical Advance Directive? Yes  Type of Advance Directive [place]  Does patient want to make changes to medical advance directive? No - Patient declined  Copy of [place] in Chart? Yes  Would patient like information on creating a medical advance directive? -     Chief Complaint  Patient presents with  . Acute Visit    Cough, URI    HPI: Patient is a 77 y.o. female seen today for an acute visit for cough. She reports having decrease in appetite and is sleeping more. She has lost some weight since being seen in 09/2017 she was 123 pounds at that time, but today in office she is 119 pounds. Feb of last year she was 133 pounds. She was working out and still is, but not enough to what the daughter feels would cause this much of a change.  She is blowing her nose with a lot of clear drainage. Cough which is productive she has not noticed anything yellow. She feels she has a lot of drainage in the back of her throat and she has to clear her throat a lot. . She denies fevers and chills. Denies headaches, sinus pain, ear pain. Denies changes in vision. She denies sore throat. She denies wheezing and SOB. Previous note from [place] in Feb stated we would look to refer her to an [place].  Per daughter: She recently went on a trip with her and noticed her headaches, appetite changes, and increase in sleepiness. She has not noticed wheezing of SOB. Daughter thinks a lot of her symptoms are environmental driven. She and daughter state that she is taking her medications as directed.   Memory-cognition overall has declined. MRI still needs to be done as per neurologly. She does not feel much as changed, but her daughter feels her overall cognition abilities have diminished from being alert and oriented to place  and time as well as situation. She also had a driving issue once in the last month.    Past Medical History:  Diagnosis Date  . Allergic rhinitis   . Anxiety   . Asthma   . Hyperlipidemia   . Migraine with aura, without mention of intractable migraine without mention of status migrainosus     Past Surgical History:  Procedure Laterality Date  . APPENDECTOMY  1975  . BREAST BIOPSY Left 1976  . TUBAL LIGATION Bilateral 1976    Allergies  Allergen Reactions  . Aspirin     Advised by MD previously not to take  . Ceftin     Dizzy & Diarrhea  . Ciprofloxacin Hcl     Dizzy, diarrhea    Outpatient Encounter Medications as of 12/08/2017  Medication Sig  . ADVAIR HFA 230-21 MCG/ACT inhaler INHALE 2 PUFFS INTO THE LUNGS TWICE DAILY  . BIOTIN PO Take 2,000 mcg by mouth daily.  . cetirizine (ZYRTEC) 10 MG tablet Take one tablet once daily for allergies  . Cholecalciferol (VITAMIN D3) 2000 UNITS TABS Take 1 tablet by mouth daily.   . citalopram (CELEXA) 20 MG tablet Take 1 tablet (20 mg total) by mouth daily.  . fluticasone (FLONASE) 50 MCG/ACT nasal spray USE 1 SPRAY IN EACH NOSTRIL EVERY DAY AS NEEDED FOR ALLERGIES  . lovastatin (MEVACOR) 40 MG tablet TAKE 1 TABLET BY MOUTH EVERY  DAY TO LOWER CHLOESTEROL  . montelukast (SINGULAIR) 10 MG tablet TAKE 1 TABLET (10 MG TOTAL) BY MOUTH DAILY.  . Multiple Vitamins-Minerals (MULTI FOR HER 50+) CAPS Take by mouth daily.   No facility-administered encounter medications on file as of 12/08/2017.     Review of Systems:  Review of Systems  Constitutional: Positive for malaise/fatigue and weight loss. Negative for chills and fever.  HENT: Positive for congestion and hearing loss.   Respiratory: Positive for cough. Negative for shortness of breath and wheezing.   Cardiovascular: Negative for chest pain, palpitations and leg swelling.  Gastrointestinal: Negative.   Genitourinary: Negative.   Neurological: Negative for headaches.    Endo/Heme/Allergies: Positive for environmental allergies.  Psychiatric/Behavioral: Positive for memory loss.    Health Maintenance  Topic Date Due  . INFLUENZA VACCINE  03/26/2018  . TETANUS/TDAP  07/20/2023  . DEXA SCAN  Completed  . PNA vac Low Risk Adult  Completed    Physical Exam: Vitals:   12/08/17 1125  BP: 120/60  Pulse: 92  Temp: 97.8 F (36.6 C)  TempSrc: Oral  SpO2: 98%  Weight: 119 lb (54 kg)  Height: 5\' 2"  (1.575 m)   Body mass index is 21.77 kg/m. Physical Exam  Constitutional: She appears well-developed. No distress.  HENT:  Head: Normocephalic.  Right Ear: External ear normal.  Left Ear: External ear normal.  Nose: Nose normal.  Mouth/Throat: Oropharynx is clear and moist.  Eyes: Pupils are equal, round, and reactive to light. Conjunctivae and EOM are normal.  Neck: Normal range of motion. Neck supple. No thyromegaly present.  Cardiovascular: Normal rate, regular rhythm, normal heart sounds and intact distal pulses.  Pulmonary/Chest: Effort normal and breath sounds normal.  Abdominal: Soft. Bowel sounds are normal.  Musculoskeletal: Normal range of motion.  Lymphadenopathy:    She has no cervical adenopathy.  Neurological: She is alert.  Skin: Skin is warm and dry. Capillary refill takes less than 2 seconds.  Psychiatric: Her speech is normal. She is slowed. Cognition and memory are impaired.  During conversation at times she has some inappropriate word choices.  She is attentive.  Vitals reviewed.   Labs reviewed: Basic Metabolic Panel: Recent Labs    04/11/17 0939 10/09/17 0931  NA 139 139  K 4.3 4.3  CL 104 106  CO2 24 29  GLUCOSE 100* 92  BUN 13 10  CREATININE 0.67 0.66  CALCIUM 9.5 9.2   Liver Function Tests: Recent Labs    04/11/17 0939  AST 21  ALT 16  ALKPHOS 62  BILITOT 0.4  PROT 6.7  ALBUMIN 4.2   No results for input(s): LIPASE, AMYLASE in the last 8760 hours. No results for input(s): AMMONIA in the last 8760  hours. CBC: Recent Labs    04/11/17 0939 10/09/17 0931  WBC 6.9 7.3  NEUTROABS 3,588 4,154  HGB 13.9 13.2  HCT 41.1 38.4  MCV 94.3 91.6  PLT 282 443*   Lipid Panel: Recent Labs    04/11/17 0939 10/09/17 0931  CHOL 176 158  HDL 60 42*  LDLCALC 81 87  TRIG 174* 191*  CHOLHDL 2.9 3.8   Lab Results  Component Value Date   HGBA1C 6.1 (H) 10/09/2017    Procedures since last visit: No results found.  Assessment/Plan  1. Mild intermittent chronic asthma without complication She has some wheezing during exam that is intermit in nature. She reports taking her medications as directed. Concerned that her asthma is being flared from allergies. Will continue  current inhalers and allergy medication as well as referring her to allergist and back to pulmonology for further work up.   2. Mild cognitive impairment with memory loss She continues to have decline in memory, at times some inappropriate communication during exam. Daughter also has noticed some changes. She is to get a MRI per neurology. Will await results, as she seems to be progressing in memory, quickly. Will also be checking a TSH level as well.   3. Weight loss, non-intentional She has loss around 14 pounds in the last year all of which she and her daughter do not attribute to working out. She reports loss in appetite as well as feeling more lethargic. Will check a labs to rule out etiology, such as thyroid.   - TSH - CBC with Differential/Platelet - Prealbumin  4. Recurrent major depressive disorder, in partial remission ([place]) Her depression and anxiety increased when we decreased celexa. Per her daughter she is not presenting the entire picture to [place] when she comes to the office alone. Will increase her celexa back to 40 mg.   5. Anxiety state See 4  6. Seasonal allergic rhinitis due to pollen Advised to use flonase twice daily with one spray in morning and one at night.  Continue zyrtec and singular. Refer to  allergist as well as follow up with pulm to make sure her asthma is well controlled.   7. Cough She has an on going cough with appears sinus related, she has clear drainage but no sinus pain or pressure. Will await MRI results to see if sinuses are causing this versus uncontrolled allergies.   Labs/tests ordered:   Orders Placed This Encounter  Procedures  . TSH  . CBC with Differential/Platelet  . Prealbumin     Next appt:  05/05/2018  Karen Kays, RN, DNP [facility] [phone], [place] [address] Cell Phone (Mon-Fri 8am-5pm):  [phone] On Call:  [phone] & follow prompts after 5pm & weekends Office Phone:  [phone] Office Fax:  [phone]

## 2017-12-08 NOTE — ED Triage Notes (Signed)
Patient went to PCP today with productive cough. PCP drew labs and urine culture. Patient's symptoms include behavior changes, urinary urgency, frequency and incontinence. Per family & EMS, patient having trouble remembering.

## 2017-12-08 NOTE — Telephone Encounter (Signed)
Spoke with daughter and she agrees to the test and will come by and sign ABN 12/09/2017.

## 2017-12-08 NOTE — Patient Instructions (Addendum)
Air purifier-work to get one of these.   We will be referring you to Allergist.  Increase Flonase to 1 spray in to each nostril twice daily.   Will increase the Celexa back to 40 mg    Will refill albuterol  Get a follow for Pulmonologist to make sure Asthma is being well controlled.  Please work to get MRI as soon as possible.

## 2017-12-08 NOTE — Telephone Encounter (Signed)
Ok, will wait on [name] to come in to sign the paperwork. Sample for prealbumin being kept in the fridge by [facility] phlebotomist until this occurs.

## 2017-12-09 ENCOUNTER — Inpatient Hospital Stay (HOSPITAL_COMMUNITY): Payer: Medicare Other

## 2017-12-09 DIAGNOSIS — G9341 Metabolic encephalopathy: Secondary | ICD-10-CM | POA: Diagnosis present

## 2017-12-09 DIAGNOSIS — Z6822 Body mass index (BMI) 22.0-22.9, adult: Secondary | ICD-10-CM | POA: Diagnosis not present

## 2017-12-09 DIAGNOSIS — J690 Pneumonitis due to inhalation of food and vomit: Secondary | ICD-10-CM | POA: Diagnosis present

## 2017-12-09 DIAGNOSIS — J189 Pneumonia, unspecified organism: Secondary | ICD-10-CM | POA: Diagnosis not present

## 2017-12-09 DIAGNOSIS — R32 Unspecified urinary incontinence: Secondary | ICD-10-CM | POA: Diagnosis present

## 2017-12-09 DIAGNOSIS — Z87891 Personal history of nicotine dependence: Secondary | ICD-10-CM | POA: Diagnosis not present

## 2017-12-09 DIAGNOSIS — F419 Anxiety disorder, unspecified: Secondary | ICD-10-CM | POA: Diagnosis not present

## 2017-12-09 DIAGNOSIS — Z9851 Tubal ligation status: Secondary | ICD-10-CM | POA: Diagnosis not present

## 2017-12-09 DIAGNOSIS — F0390 Unspecified dementia without behavioral disturbance: Secondary | ICD-10-CM | POA: Diagnosis present

## 2017-12-09 DIAGNOSIS — M1990 Unspecified osteoarthritis, unspecified site: Secondary | ICD-10-CM | POA: Diagnosis present

## 2017-12-09 DIAGNOSIS — Z7951 Long term (current) use of inhaled steroids: Secondary | ICD-10-CM | POA: Diagnosis not present

## 2017-12-09 DIAGNOSIS — R4020 Unspecified coma: Secondary | ICD-10-CM | POA: Diagnosis not present

## 2017-12-09 DIAGNOSIS — Z881 Allergy status to other antibiotic agents status: Secondary | ICD-10-CM | POA: Diagnosis not present

## 2017-12-09 DIAGNOSIS — G3184 Mild cognitive impairment, so stated: Secondary | ICD-10-CM | POA: Diagnosis not present

## 2017-12-09 DIAGNOSIS — Z91013 Allergy to seafood: Secondary | ICD-10-CM | POA: Diagnosis not present

## 2017-12-09 DIAGNOSIS — J45909 Unspecified asthma, uncomplicated: Secondary | ICD-10-CM | POA: Diagnosis present

## 2017-12-09 DIAGNOSIS — J181 Lobar pneumonia, unspecified organism: Secondary | ICD-10-CM | POA: Diagnosis not present

## 2017-12-09 DIAGNOSIS — E785 Hyperlipidemia, unspecified: Secondary | ICD-10-CM | POA: Diagnosis present

## 2017-12-09 DIAGNOSIS — A419 Sepsis, unspecified organism: Secondary | ICD-10-CM | POA: Diagnosis not present

## 2017-12-09 DIAGNOSIS — Z87892 Personal history of anaphylaxis: Secondary | ICD-10-CM | POA: Diagnosis not present

## 2017-12-09 DIAGNOSIS — M810 Age-related osteoporosis without current pathological fracture: Secondary | ICD-10-CM | POA: Diagnosis present

## 2017-12-09 DIAGNOSIS — G92 Toxic encephalopathy: Secondary | ICD-10-CM | POA: Diagnosis not present

## 2017-12-09 DIAGNOSIS — Z79899 Other long term (current) drug therapy: Secondary | ICD-10-CM | POA: Diagnosis not present

## 2017-12-09 DIAGNOSIS — F329 Major depressive disorder, single episode, unspecified: Secondary | ICD-10-CM | POA: Diagnosis not present

## 2017-12-09 DIAGNOSIS — Z888 Allergy status to other drugs, medicaments and biological substances status: Secondary | ICD-10-CM | POA: Diagnosis not present

## 2017-12-09 DIAGNOSIS — R4182 Altered mental status, unspecified: Secondary | ICD-10-CM | POA: Diagnosis present

## 2017-12-09 DIAGNOSIS — Z825 Family history of asthma and other chronic lower respiratory diseases: Secondary | ICD-10-CM | POA: Diagnosis not present

## 2017-12-09 LAB — CBC
HEMATOCRIT: 36.5 % (ref 36.0–46.0)
Hemoglobin: 12.1 g/dL (ref 12.0–15.0)
MCH: 30.9 pg (ref 26.0–34.0)
MCHC: 33.2 g/dL (ref 30.0–36.0)
MCV: 93.4 fL (ref 78.0–100.0)
PLATELETS: 305 10*3/uL (ref 150–400)
RBC: 3.91 MIL/uL (ref 3.87–5.11)
RDW: 13.4 % (ref 11.5–15.5)
WBC: 9.5 10*3/uL (ref 4.0–10.5)

## 2017-12-09 LAB — BASIC METABOLIC PANEL
Anion gap: 15 (ref 5–15)
BUN: 9 mg/dL (ref 6–20)
CHLORIDE: 103 mmol/L (ref 101–111)
CO2: 19 mmol/L — AB (ref 22–32)
CREATININE: 0.59 mg/dL (ref 0.44–1.00)
Calcium: 8.6 mg/dL — ABNORMAL LOW (ref 8.9–10.3)
GFR calc Af Amer: >60 mL/min (ref >60)
GFR calc non Af Amer: >60 mL/min (ref >60)
GLUCOSE: 125 mg/dL — AB (ref 65–99)
POTASSIUM: 3.6 mmol/L (ref 3.5–5.1)
Sodium: 137 mmol/L (ref 135–145)

## 2017-12-09 LAB — EXPECTORATED SPUTUM ASSESSMENT W GRAM STAIN, RFLX TO RESP C: Special Requests: NORMAL

## 2017-12-09 LAB — CBC WITH DIFFERENTIAL/PLATELET
Basophils Absolute: 61 cells/uL (ref 0–200)
Basophils Relative: 0.6 %
Eosinophils Absolute: 283 cells/uL (ref 15–500)
Eosinophils Relative: 2.8 %
HCT: 39.2 % (ref 35.0–45.0)
Hemoglobin: 13.4 g/dL (ref 11.7–15.5)
Lymphs Abs: 1747 cells/uL (ref 850–3900)
MCH: 31.2 pg (ref 27.0–33.0)
MCHC: 34.2 g/dL (ref 32.0–36.0)
MCV: 91.2 fL (ref 80.0–100.0)
MPV: 10.7 fL (ref 7.5–12.5)
Monocytes Relative: 10.9 %
Neutro Abs: 6908 cells/uL (ref 1500–7800)
Neutrophils Relative %: 68.4 %
Platelets: 304 10*3/uL (ref 140–400)
RBC: 4.3 10*6/uL (ref 3.80–5.10)
RDW: 12.7 % (ref 11.0–15.0)
Total Lymphocyte: 17.3 %
WBC mixed population: 1101 cells/uL — ABNORMAL HIGH (ref 200–950)
WBC: 10.1 10*3/uL (ref 3.8–10.8)

## 2017-12-09 LAB — EXPECTORATED SPUTUM ASSESSMENT W REFEX TO RESP CULTURE

## 2017-12-09 LAB — TSH: TSH: 0.77 mIU/L (ref 0.40–4.50)

## 2017-12-09 LAB — INFLUENZA PANEL BY PCR (TYPE A & B)
INFLAPCR: NEGATIVE
INFLBPCR: NEGATIVE

## 2017-12-09 MED ORDER — LORATADINE 10 MG PO TABS
10.0000 mg | ORAL_TABLET | Freq: Every day | ORAL | Status: DC
  Administered 2017-12-09 – 2017-12-11 (×3): 10 mg via ORAL
  Filled 2017-12-09 (×3): qty 1

## 2017-12-09 MED ORDER — BISACODYL 5 MG PO TBEC: 5.0000 mg | DELAYED_RELEASE_TABLET | Freq: Every day | ORAL | Status: DC | PRN

## 2017-12-09 MED ORDER — DM-GUAIFENESIN ER 30-600 MG PO TB12
1.0000 | ORAL_TABLET | Freq: Two times a day (BID) | ORAL | Status: DC
  Administered 2017-12-09 – 2017-12-11 (×6): 1 via ORAL
  Filled 2017-12-09 (×6): qty 1

## 2017-12-09 MED ORDER — ONDANSETRON HCL 4 MG/2ML IJ SOLN: 4.0000 mg | Freq: Four times a day (QID) | INTRAMUSCULAR | Status: DC | PRN

## 2017-12-09 MED ORDER — IPRATROPIUM BROMIDE 0.02 % IN SOLN: 0.5000 mg | Freq: Four times a day (QID) | RESPIRATORY_TRACT | Status: DC | PRN

## 2017-12-09 MED ORDER — MONTELUKAST SODIUM 10 MG PO TABS
10.0000 mg | ORAL_TABLET | Freq: Every day | ORAL | Status: DC
  Administered 2017-12-09 – 2017-12-10 (×2): 10 mg via ORAL
  Filled 2017-12-09 (×2): qty 1

## 2017-12-09 MED ORDER — AZITHROMYCIN 250 MG PO TABS
500.0000 mg | ORAL_TABLET | Freq: Every day | ORAL | Status: DC
Start: 2017-12-09 — End: 2017-12-11
  Administered 2017-12-09 – 2017-12-10 (×2): 500 mg via ORAL
  Filled 2017-12-09 (×2): qty 2

## 2017-12-09 MED ORDER — TRAZODONE HCL 50 MG PO TABS
25.0000 mg | ORAL_TABLET | Freq: Every day | ORAL | Status: DC
  Administered 2017-12-09 – 2017-12-10 (×2): 25 mg via ORAL
  Filled 2017-12-09 (×2): qty 1

## 2017-12-09 MED ORDER — ENSURE ENLIVE PO LIQD
237.0000 mL | Freq: Two times a day (BID) | ORAL | Status: DC
  Administered 2017-12-09 – 2017-12-11 (×4): 237 mL via ORAL

## 2017-12-09 MED ORDER — LIP MEDEX EX OINT
TOPICAL_OINTMENT | CUTANEOUS | Status: AC
  Administered 2017-12-09: 12:00:00
  Filled 2017-12-09: qty 7

## 2017-12-09 MED ORDER — HALOPERIDOL LACTATE 5 MG/ML IJ SOLN: 1.0000 mg | Freq: Four times a day (QID) | INTRAMUSCULAR | Status: DC | PRN

## 2017-12-09 MED ORDER — VITAMIN D 1000 UNITS PO TABS
2000.0000 [IU] | ORAL_TABLET | Freq: Every day | ORAL | Status: DC
  Administered 2017-12-09 – 2017-12-11 (×3): 2000 [IU] via ORAL
  Filled 2017-12-09 (×3): qty 2

## 2017-12-09 MED ORDER — FLUTICASONE FUROATE-VILANTEROL 200-25 MCG/INH IN AEPB
1.0000 | INHALATION_SPRAY | Freq: Every day | RESPIRATORY_TRACT | Status: DC
  Administered 2017-12-09 – 2017-12-10 (×2): 1 via RESPIRATORY_TRACT
  Filled 2017-12-09: qty 28

## 2017-12-09 MED ORDER — ALBUTEROL SULFATE (2.5 MG/3ML) 0.083% IN NEBU: 2.5000 mg | INHALATION_SOLUTION | Freq: Four times a day (QID) | RESPIRATORY_TRACT | Status: DC | PRN

## 2017-12-09 MED ORDER — SODIUM CHLORIDE 0.9 % IV SOLN
INTRAVENOUS | Status: DC
  Administered 2017-12-09 – 2017-12-11 (×3): via INTRAVENOUS

## 2017-12-09 MED ORDER — SODIUM CHLORIDE 0.9 % IV SOLN
1.5000 g | Freq: Four times a day (QID) | INTRAVENOUS | Status: DC
  Administered 2017-12-09 – 2017-12-11 (×8): 1.5 g via INTRAVENOUS
  Filled 2017-12-09 (×9): qty 1.5

## 2017-12-09 MED ORDER — FLUTICASONE PROPIONATE 50 MCG/ACT NA SUSP
1.0000 | Freq: Every day | NASAL | Status: DC
  Administered 2017-12-09 – 2017-12-11 (×3): 1 via NASAL
  Filled 2017-12-09: qty 16

## 2017-12-09 MED ORDER — SODIUM CHLORIDE 0.9 % IV SOLN: 500.0000 mg | INTRAVENOUS | Status: DC

## 2017-12-09 MED ORDER — SODIUM CHLORIDE 0.9 % IV SOLN: 1.0000 g | INTRAVENOUS | Status: DC

## 2017-12-09 MED ORDER — ADULT MULTIVITAMIN W/MINERALS CH
ORAL_TABLET | Freq: Every day | ORAL | Status: DC
  Administered 2017-12-09 – 2017-12-11 (×3): 1 via ORAL
  Filled 2017-12-09 (×3): qty 1

## 2017-12-09 MED ORDER — BIOTIN 2.5 MG PO TABS: 2.0000 mg | ORAL_TABLET | Freq: Every day | ORAL | Status: DC

## 2017-12-09 MED ORDER — CITALOPRAM HYDROBROMIDE 20 MG PO TABS
40.0000 mg | ORAL_TABLET | Freq: Every day | ORAL | Status: DC
  Administered 2017-12-09 – 2017-12-11 (×3): 40 mg via ORAL
  Filled 2017-12-09 (×3): qty 2

## 2017-12-09 MED ORDER — ENOXAPARIN SODIUM 40 MG/0.4ML ~~LOC~~ SOLN
40.0000 mg | SUBCUTANEOUS | Status: DC
  Administered 2017-12-09 – 2017-12-11 (×3): 40 mg via SUBCUTANEOUS
  Filled 2017-12-09 (×3): qty 0.4

## 2017-12-09 MED ORDER — ACETAMINOPHEN 325 MG PO TABS
650.0000 mg | ORAL_TABLET | Freq: Three times a day (TID) | ORAL | Status: DC | PRN
Start: 2017-12-09 — End: 2017-12-11
  Administered 2017-12-09 – 2017-12-10 (×2): 650 mg via ORAL
  Filled 2017-12-09 (×2): qty 2

## 2017-12-09 MED ORDER — SENNOSIDES-DOCUSATE SODIUM 8.6-50 MG PO TABS: 1.0000 | ORAL_TABLET | Freq: Every evening | ORAL | Status: DC | PRN

## 2017-12-09 MED ORDER — PRAVASTATIN SODIUM 40 MG PO TABS
40.0000 mg | ORAL_TABLET | Freq: Every day | ORAL | Status: DC
  Administered 2017-12-09 – 2017-12-10 (×2): 40 mg via ORAL
  Filled 2017-12-09 (×3): qty 1

## 2017-12-09 MED ORDER — ONDANSETRON HCL 4 MG PO TABS: 4.0000 mg | ORAL_TABLET | Freq: Four times a day (QID) | ORAL | Status: DC | PRN

## 2017-12-09 MED ORDER — MAGNESIUM CITRATE PO SOLN: 1.0000 | Freq: Once | ORAL | Status: DC | PRN

## 2017-12-09 NOTE — Progress Notes (Signed)
Spoke with [name] (POA). Pt does not have a good relationship with her husband. Pt currently lives with husband and daughter is concerned about the living environment (environmental and social). Social work was consulted. Family member and patient decided that she would like to become a private XXX patient. The patient does not want her husband up on the floor visiting. Secure password in chart for only information to be given out to this family members.

## 2017-12-09 NOTE — Progress Notes (Signed)
Pharmacy Antibiotic Note  Melinda Griffin is a 77 y.o. female admitted on 12/08/2017 with pneumonia.  Pharmacy has been consulted for rocephin  dosing.  Plan: Rocephin 1 Gm IV q24h for CAP Zmax 500 mg IV q24h (MD) F/u scr/cultures  Height: 5\' 1"  (154.9 cm) Weight: 119 lb (54 kg) IBW/kg (Calculated) : 47.8  Temp (24hrs), Avg:100.6 F (38.1 C), Min:97.8 F (36.6 C), Max:102.7 F (39.3 C)  Recent Labs  Lab 12/08/17 1341 12/08/17 2141 12/08/17 2233 12/08/17 2258  WBC 10.1 8.9  --   --   CREATININE  --   --   --  0.65  LATICACIDVEN  --   --  1.58  --     Estimated Creatinine Clearance: 43.7 mL/min (by C-G formula based on SCr of 0.65 mg/dL).    Allergies  Allergen Reactions  . 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 Aspirin     Advised by MD previously not to take  . Ceftin     Dizzy & Diarrhea  . Ciprofloxacin Hcl     Dizzy, diarrhea    Antimicrobials this admission: 4/15 rocephin >>  4/15 zmax >>   Dose adjustments this admission:   Microbiology results:  BCx:   UCx:    Sputum:    MRSA PCR:  Thank you for allowing pharmacy to be a part of this patient's care.  Dorrene German 12/09/2017 3:35 AM

## 2017-12-09 NOTE — Evaluation (Signed)
Physical Therapy Evaluation Patient Details Name: Melinda Griffin MRN: [id number] DOB: [birth year] Today's Date: 12/09/2017   History of Present Illness  77 y.o. female with a known history of anxiety, asthma, HLD, OA, migraines presents to the emergency department for evaluation of AMS and admitted for sepsis secondary to CAP  Clinical Impression  Pt admitted with above diagnosis. Pt currently with functional limitations due to the deficits listed below (see PT Problem List). Pt will benefit from skilled PT to increase their independence and safety with mobility to allow discharge to the venue listed below.   Pt had just ambulated to bathroom and then recliner with nursing.  Pt resistant to ambulating today due to fatigue and weakness despite encouragement and benefits.  Pt requested assist back to bed. SPO2 on room air was 93% however dropped to 85% room air after transferring back to bed so reapplied 2L O2 [place]. Pt appears to be moving well within room however further distance and exertion limited today per pt request.     Follow Up Recommendations No PT follow up;Supervision for mobility/OOB    Equipment Recommendations  None recommended by PT    Recommendations for Other Services       Precautions / Restrictions Precautions Precaution Comments: monitor sats Restrictions Weight Bearing Restrictions: No      Mobility  Bed Mobility Overal bed mobility: Needs Assistance Bed Mobility: Sit to Supine       Sit to supine: Supervision      Transfers Overall transfer level: Needs assistance Equipment used: None Transfers: Sit to/from Stand;Stand Pivot Transfers Sit to Stand: Supervision Stand pivot transfers: Supervision       General transfer comment: supervision for safety, Spo2 dropped to 85% on room air so 2L O2 [place] reapplied  Ambulation/Gait                Stairs            Wheelchair Mobility    Modified Rankin (Stroke Patients Only)        Balance Overall balance assessment: Mild deficits observed, not formally tested                                           Pertinent Vitals/Pain Pain Assessment: No/denies pain    Home Living Family/patient expects to be discharged to:: Private residence Living Arrangements: Spouse/significant other   Type of Home: House         Home Equipment: None      Prior Function Level of Independence: Independent               Hand Dominance        Extremity/Trunk Assessment        Lower Extremity Assessment Lower Extremity Assessment: Generalized weakness       Communication   Communication: No difficulties  Cognition Arousal/Alertness: Awake/alert   Overall Cognitive Status: No family/caregiver present to determine baseline cognitive functioning                                 General Comments: pt appears slightly but pleasantly confused, reported knowing this therapist and agreeable to participate and then changing her mind      General Comments      Exercises     Assessment/Plan    PT Assessment Patient needs continued  PT services  PT Problem List Decreased strength;Decreased mobility;Decreased activity tolerance;Decreased knowledge of use of DME;Cardiopulmonary status limiting activity       PT Treatment Interventions DME instruction;Therapeutic activities;Gait training;Therapeutic exercise;Patient/family education;Stair training;Functional mobility training    PT Goals (Current goals can be found in the Care Plan section)  Acute Rehab PT Goals PT Goal Formulation: With patient Time For Goal Achievement: 12/16/17 Potential to Achieve Goals: Good    Frequency Min 3X/week   Barriers to discharge        Co-evaluation               AM-PAC PT "6 Clicks" Daily Activity  Outcome Measure Difficulty turning over in bed (including adjusting bedclothes, sheets and blankets)?: None Difficulty moving from lying  on back to sitting on the side of the bed? : None Difficulty sitting down on and standing up from a chair with arms (e.g., wheelchair, bedside commode, etc,.)?: A Little Help needed moving to and from a bed to chair (including a wheelchair)?: A Little Help needed walking in hospital room?: A Little Help needed climbing 3-5 steps with a railing? : A Little 6 Click Score: 20    End of Session Equipment Utilized During Treatment: Oxygen Activity Tolerance: Patient limited by fatigue Patient left: in bed;with bed alarm set;with call bell/phone within reach   PT Visit Diagnosis: Difficulty in walking, not elsewhere classified (R26.2);Muscle weakness (generalized) (M62.81)    Time: 7782-4235 PT Time Calculation (min) (ACUTE ONLY): 19 min   Charges:   PT Evaluation $PT Eval Low Complexity: 1 Low     PT G Codes:        Carmelia Bake, PT, DPT 12/09/2017 Pager: [phone]  York Ram E 12/09/2017, 2:18 PM

## 2017-12-09 NOTE — Progress Notes (Addendum)
Pharmacy Antibiotic Note  Melinda Griffin is a 77 y.o. female admitted on 12/08/2017 with aspiration PNA.  Pharmacy has been consulted for Unasyn dosing.  Plan: Unasyn 1.5 gm IV q6h Azithromycin 500 mg po q24 per MD Pharmacy to sign off  Height: 5\' 1"  (154.9 cm) Weight: 119 lb (54 kg) IBW/kg (Calculated) : 47.8  Temp (24hrs), Avg:101.5 F (38.6 C), Min:100.5 F (38.1 C), Max:102.7 F (39.3 C)  Recent Labs  Lab 12/08/17 1341 12/08/17 2141 12/08/17 2233 12/08/17 2258 12/09/17 0421  WBC 10.1 8.9  --   --  9.5  CREATININE  --   --   --  0.65 0.59  LATICACIDVEN  --   --  1.58  --   --     Estimated Creatinine Clearance: 43.7 mL/min (by C-G formula based on SCr of 0.59 mg/dL).    Allergies  Allergen Reactions  . 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 Aspirin     Advised by MD previously not to take  . Ceftin     Dizzy & Diarrhea  . Ciprofloxacin Hcl     Dizzy, diarrhea    Antimicrobials this admission: 4/15 CTX>4/16 4/16 azith >> 4/16 Unasyn >> Dose adjustments this admission: Microbiology results: 4/15 flu neg 4/16 sputum>> 4/15 BCx2>. 4/15 UCx>>  Thank you for allowing pharmacy to be a part of this patient's care.  Eudelia Bunch, Pharm.D. [phone] 12/09/2017 1:06 PM

## 2017-12-09 NOTE — Progress Notes (Signed)
Initial Nutrition Assessment  DOCUMENTATION CODES:   Not applicable  INTERVENTION:   Ensure Enlive po BID, each supplement provides 350 kcal and 20 grams of protein  NUTRITION DIAGNOSIS:   Inadequate oral intake related to poor appetite as evidenced by per patient/family report.  GOAL:   Patient will meet greater than or equal to 90% of their needs  MONITOR:   PO intake, Supplement acceptance, Weight trends, Labs  REASON FOR ASSESSMENT:   Malnutrition Screening Tool    ASSESSMENT:   Patient with PMH significant for anxiety, asthma, HLD, OA, and migraines. Presents this admission with sepsis secondary to HCAP.    Pt remains confused at bedside. Reports appetite has decreased over the last month due to unknown reasons. States she typically eats 3 meals per day and works out at [employer] weekly. Husband continuously shaking his head "no" during pt interview. Husband reports she eats very little at meal times but unable to provide full history (pt got upset when he spoke for her). Pt ate 40% of her breakfast this morning. She is amendable to protein supplementation this stay.   Pt endorses a UBW of 130 lb and a wt loss of 10 lb in 3-4 months, Records indicate pt weighed 132 lb 07/23/17 and show a stated weight of 119 lb this admission. Will need to obtain actual weight to quantify actual weight loss. Nutrition-Focused physical exam completed. Suspect some form of malnutrition, but unable to diagnosis at this time. Will await actual weight reading for further evidence.   Medications reviewed and include: Vit D, MVI with minerals, NS @ 75 ml/hr, IV abx Labs reviewed.   NUTRITION - FOCUSED PHYSICAL EXAM:    Most Recent Value  Orbital Region  No depletion  Upper Arm Region  No depletion  Thoracic and Lumbar Region  Unable to assess  Buccal Region  No depletion  Temple Region  Moderate depletion  Clavicle Bone Region  No depletion  Clavicle and Acromion Bone Region  Mild depletion   Scapular Bone Region  Unable to assess  Dorsal Hand  Moderate depletion  Patellar Region  Moderate depletion  Anterior Thigh Region  Moderate depletion  Posterior Calf Region  Moderate depletion  Edema (RD Assessment)  None     Diet Order:  Fall precautions DIET SOFT Room service appropriate? Yes; Fluid consistency: Thin Aspiration precautions  EDUCATION NEEDS:   Education needs have been addressed  Skin:  Skin Assessment: Reviewed RN Assessment  Last BM:  12/08/17  Height:   Ht Readings from Last 1 Encounters:  12/08/17 5\' 1"  (1.549 m)    Weight:   Wt Readings from Last 1 Encounters:  12/08/17 119 lb (54 kg)    Ideal Body Weight:  47.7 kg  BMI:  Body mass index is 22.48 kg/m.  Estimated Nutritional Needs:   Kcal:  1450-1650 kcal  Protein:  70-80 g  Fluid:  >1.4 L/day    Mariana Single RD, LDN Clinical Nutrition Pager # [phone]

## 2017-12-09 NOTE — Progress Notes (Signed)
PHARMACIST - PHYSICIAN ORDER COMMUNICATION  CONCERNING: P&T Medication Policy on Herbal Medications  DESCRIPTION:  This patient's order for:  Biotin  has been noted.  This product(s) is classified as an "herbal" or natural product. Due to a lack of definitive safety studies or FDA approval, nonstandard manufacturing practices, plus the potential risk of unknown drug-drug interactions while on inpatient medications, the Pharmacy and Therapeutics Committee does not permit the use of "herbal" or natural products of this type within [facility].   ACTION TAKEN: The pharmacy department is unable to verify this order at this time and your patient has been informed of this safety policy. Please reevaluate patient's clinical condition at discharge and address if the herbal or natural product(s) should be resumed at that time.   Thanks Dorrene German 12/09/2017 3:40 AM

## 2017-12-09 NOTE — Evaluation (Signed)
Clinical/Bedside Swallow Evaluation Patient Details  Name: Melinda Griffin MRN: [id number] Date of Birth: [birth year]  Today's Date: 12/09/2017 Time: SLP Start Time (ACUTE ONLY): 1610 SLP Stop Time (ACUTE ONLY): 1640 SLP Time Calculation (min) (ACUTE ONLY): 30 min  Past Medical History:  Past Medical History:  Diagnosis Date  . Allergic rhinitis   . Anxiety   . Asthma   . Hyperlipidemia   . Migraine with aura, without mention of intractable migraine without mention of status migrainosus    Past Surgical History:  Past Surgical History:  Procedure Laterality Date  . APPENDECTOMY  1975  . BREAST BIOPSY Left 1976  . TUBAL LIGATION Bilateral 1976   HPI:  Pt admitted to [facility] with fever and cough.  Pt found to have right lobe pna.  PMH + for asthma, migraines, mild memory loss.  Pt states she has bad allergies and this year has been bad.   Pt today reports she has been in the hospital for a few days - and states she had a bad reaction to medicine. Per RD, pt states decreased appetite over the last few months.  Reported to RD that she typically eats 3 meals per day and works out at [employer] weekly.  Husband reported she eats very little at meal times and does not go to the gym frequently.  Pt ate 40% of her breakfast this morning per RD note.  Per review of notes, pt also had laryngitis in January 2019 - denies current symptoms being similar.  Swallow evaluation ordered.     Assessment / Plan / Recommendation Clinical Impression  Pt presents with functional oropharyngeal swallow ability.  No indications of airway compromise with po intake *5 ounces water, graham cracker and applesauce.  Swallow was timely with baseline voice throughout.  Pt adamantly denies reflux issues nor dysphagia.  Recommend to advance to regular/thin diet.  No SLP follow up needed.  SLP Visit Diagnosis: Dysphagia, unspecified (R13.10)    Aspiration Risk  No limitations    Diet Recommendation Regular;Thin liquid    Liquid Administration via: Cup;Straw Medication Administration: Whole meds with liquid Supervision: Patient able to self feed Compensations: Minimize environmental distractions;Slow rate;Small sips/bites    Other  Recommendations Oral Care Recommendations: Oral care BID   Follow up Recommendations        Frequency and Duration            Prognosis        Swallow Study   General Date of Onset: 12/09/17 HPI: Pt admitted to [facility] with fever and cough.  Pt found to have right lobe pna.  PMH + for asthma, migraines, mild memory loss.  Pt states she has bad allergies and this year has been bad.   Pt today reports she has been in the hospital for a few days - and states she had a bad reaction to medicine. Per RD, pt states decreased appetite over the last few months.  Reported to RD that she typically eats 3 meals per day and works out at [employer] weekly.  Husband reported she eats very little at meal times and does not go to the gym frequently.  Pt ate 40% of her breakfast this morning per RD note.  Per review of notes, pt also had laryngitis in January 2019 - denies current symptoms being similar.  Swallow evaluation ordered.   Type of Study: Bedside Swallow Evaluation Diet Prior to this Study: Regular;Thin liquids Temperature Spikes Noted: Yes(102.7) Respiratory Status: Nasal cannula History  of Recent Intubation: No Behavior/Cognition: Alert;Cooperative;Other (Comment)(at times, pt is off topic) Oral Cavity Assessment: Within Functional Limits Oral Care Completed by SLP: No Oral Cavity - Dentition: Adequate natural dentition Vision: Functional for self-feeding Self-Feeding Abilities: Able to feed self Patient Positioning: Upright in bed Baseline Vocal Quality: Hoarse Volitional Cough: Strong Volitional Swallow: Able to elicit    Oral/Motor/Sensory Function Overall Oral Motor/Sensory Function: Within functional limits   Ice Chips Ice chips: Not tested   Thin Liquid Thin Liquid:  Within functional limits Presentation: Self Fed;Cup;Straw Other Comments: pt did not pass 3 ounce water test - as she required rest break in the middle= no indication of airway compromise    Nectar Thick Nectar Thick Liquid: Not tested   Honey Thick Honey Thick Liquid: Not tested   Puree Puree: Within functional limits Presentation: Self Fed;Spoon   Solid   GO   Solid: Within functional limits Presentation: Self Fredirick Lathe 12/09/2017,4:41 PM   Luanna Salk, [facility] SLP [phone]

## 2017-12-09 NOTE — H&P (Signed)
History and Physical   [facility] [place] [facility] Admission History and Physical [employer], D.O.    Patient Name: Melinda Griffin MR#: [id number] Date of Birth: 1941-05-24 Date of Admission: 12/08/2017  Referring MD/NP/PA: Dr. Regenia Skeeter Primary Care Physician: Gayland Curry, DO  Chief Complaint:  Chief Complaint  Patient presents with  . Urinary Tract Infection    HPI: Melinda Griffin is a 77 y.o. female with a known history of anxiety, asthma, HLD, OA, migraines presents to the emergency department for evaluation of AMS.  Patient was in a usual state of health until two days ago when family noted her to be more confused than usual, has been working with a neurologist on mild cognitive impairment.  Has had decreased PO intake, generalized weakness, fatigue.  Complained of cough and urinary incontinence.  Daughter was concerned about UTI because when she found her today she was incoherent.  Improving after fluids and Abx in ED. Patient saw here PCP today.  Of note she has been intermittently sick since January.  Recent travel to [place]. Very active otherwise. .  Patient denies fevers/chills, weakness, dizziness, chest pain, shortness of breath, N/V/C/D, abdominal pain, dysuria/frequency, changes in mental status.    Otherwise there has been no change in status. Patient has been taking medication as prescribed and there has been no recent change in medication or diet.  No recent antibiotics.  There has been no recent illness, hospitalizations, travel or sick contacts.    EMS/ED Course: Patient received Tylenol, Rocephin, Azithro. Medical admission has been requested for further management of sepsis 2/2 CAP.  Review of Systems:  CONSTITUTIONAL: Positive fever/chills, fatigue, weakness, AMS, weight gain/loss, headache. EYES: No blurry or double vision. ENT: No tinnitus, postnasal drip, redness or soreness of the oropharynx. RESPIRATORY: Positive cough, negative  dyspnea, wheeze.  No hemoptysis.  CARDIOVASCULAR: No chest pain, palpitations, syncope, orthopnea. No lower extremity edema.  GASTROINTESTINAL: No nausea, vomiting, abdominal pain, diarrhea, constipation.  No hematemesis, melena or hematochezia. GENITOURINARY: No dysuria, frequency, hematuria. ENDOCRINE: No polyuria or nocturia. No heat or cold intolerance. HEMATOLOGY: No anemia, bruising, bleeding. INTEGUMENTARY: No rashes, ulcers, lesions. MUSCULOSKELETAL: No arthritis, gout, dyspnea. NEUROLOGIC: No numbness, tingling, ataxia, seizure-type activity, weakness. PSYCHIATRIC: No anxiety, depression, insomnia.   Past Medical History:  Diagnosis Date  . Allergic rhinitis   . Anxiety   . Asthma   . Hyperlipidemia   . Migraine with aura, without mention of intractable migraine without mention of status migrainosus     Past Surgical History:  Procedure Laterality Date  . APPENDECTOMY  1975  . BREAST BIOPSY Left 1976  . TUBAL LIGATION Bilateral 1976     reports that she quit smoking about 56 years ago. Her smoking use included cigarettes. She has never used smokeless tobacco. She reports that she does not drink alcohol or use drugs.  Allergies  Allergen Reactions  . 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 Aspirin     Advised by MD previously not to take  . Ceftin     Dizzy & Diarrhea  . Ciprofloxacin Hcl     Dizzy, diarrhea    Family History  Problem Relation Age of Onset  . Asthma Father   . Asthma Sister   . Heart disease Sister   . Cancer Mother        brain, breast  . Cancer Brother 51  . Heart disease Brother   . Hypertension Brother     Prior to Admission medications  Medication Sig Start Date End Date Taking? Authorizing Provider  acetaminophen (TYLENOL) 650 MG CR tablet Take 650 mg by mouth every 8 (eight) hours as needed for pain.   Yes [provider]  ADVAIR HFA 230-21 MCG/ACT inhaler INHALE 2 PUFFS INTO THE LUNGS TWICE DAILY 09/04/17  Yes Reed,  Tiffany L, DO  BIOTIN PO Take 2,000 mcg by mouth daily.   Yes [provider]  cetirizine (ZYRTEC) 10 MG tablet Take one tablet once daily for allergies   Yes [provider]  Cholecalciferol (VITAMIN D3) 2000 UNITS TABS Take 1 tablet by mouth daily.    Yes [provider]  citalopram (CELEXA) 40 MG tablet Take 40 mg by mouth daily.   Yes [provider]  fluticasone (FLONASE) 50 MCG/ACT nasal spray USE 1 SPRAY IN EACH NOSTRIL EVERY DAY AS NEEDED FOR ALLERGIES Patient taking differently: Instill 2 sprays into each nare daily as needed for allergies 01/18/14  Yes Reed, Tiffany L, DO  lovastatin (MEVACOR) 40 MG tablet TAKE 1 TABLET BY MOUTH EVERY DAY TO LOWER CHLOESTEROL Patient taking differently: Take 40mg  by mouth daily 12/01/17  Yes Reed, Tiffany L, DO  montelukast (SINGULAIR) 10 MG tablet TAKE 1 TABLET (10 MG TOTAL) BY MOUTH DAILY. 12/01/17  Yes Reed, Tiffany L, DO  Multiple Vitamins-Minerals (MULTI FOR HER 50+) CAPS Take by mouth daily.   Yes [provider]  citalopram (CELEXA) 40 MG tablet Take 0.5 tablets (20 mg total) by mouth daily. Patient not taking: Reported on 12/08/2017 12/08/17   Gayland Curry, DO    Physical Exam: Vitals:   12/08/17 2330 12/09/17 0000 12/09/17 0006 12/09/17 0030  BP: 135/70 (!) 115/53 (!) 115/53 (!) 118/57  Pulse: (!) 103 96 97 97  Resp: (!) 22 10 16  (!) 25  Temp:      TempSrc:      SpO2: 94% 93% 92% 96%  Weight:      Height:        GENERAL: 77 y.o.-year-old female patient, well-developed, well-nourished lying in the bed in no acute distress.  Pleasant and cooperative.   HEENT: Head atraumatic, normocephalic. Pupils equal. Mucus membranes moist. NECK: Supple, full range of motion. No JVD, no bruit heard. No thyroid enlargement, no tenderness, no cervical lymphadenopathy. CHEST: Normal breath sounds bilaterally. No wheezing, rales, rhonchi or crackles. No use of accessory muscles of respiration.  No reproducible  chest wall tenderness.  CARDIOVASCULAR: S1, S2 normal. No murmurs, rubs, or gallops. Cap refill <2 seconds. Pulses intact distally.  ABDOMEN: Soft, nondistended, nontender. No rebound, guarding, rigidity. Normoactive bowel sounds present in all four quadrants.  EXTREMITIES: No pedal edema, cyanosis, or clubbing. No calf tenderness or Homan's sign.  NEUROLOGIC: The patient is alert and oriented x 3. Cranial nerves II through XII are grossly intact with no focal sensorimotor deficit. PSYCHIATRIC:  Normal affect, mood, thought content. SKIN: Warm, dry, and intact without obvious rash, lesion, or ulcer.    Labs on Admission:  CBC: Recent Labs  Lab 12/08/17 1341 12/08/17 2141  WBC 10.1 8.9  NEUTROABS 6,908 6.5  HGB 13.4 12.8  HCT 39.2 36.8  MCV 91.2 90.6  PLT 304 762   Basic Metabolic Panel: Recent Labs  Lab 12/08/17 2258  NA 136  K 3.8  CL 104  CO2 20*  GLUCOSE 122*  BUN 12  CREATININE 0.65  CALCIUM 8.1*   GFR: Estimated Creatinine Clearance: 43.7 mL/min (by C-G formula based on SCr of 0.65 mg/dL). Liver Function Tests: Recent  Labs  Lab 12/08/17 2258  AST 26  ALT 16  ALKPHOS 60  BILITOT 0.9  PROT 7.0  ALBUMIN 3.2*   No results for input(s): LIPASE, AMYLASE in the last 168 hours. No results for input(s): AMMONIA in the last 168 hours. Coagulation Profile: No results for input(s): INR, PROTIME in the last 168 hours. Cardiac Enzymes: No results for input(s): CKTOTAL, CKMB, CKMBINDEX, TROPONINI in the last 168 hours. BNP (last 3 results) No results for input(s): PROBNP in the last 8760 hours. HbA1C: No results for input(s): HGBA1C in the last 72 hours. CBG: No results for input(s): GLUCAP in the last 168 hours. Lipid Profile: No results for input(s): CHOL, HDL, LDLCALC, TRIG, CHOLHDL, LDLDIRECT in the last 72 hours. Thyroid Function Tests: Recent Labs    12/08/17 1341  TSH 0.77   Anemia Panel: No results for input(s): VITAMINB12, FOLATE, FERRITIN, TIBC,  IRON, RETICCTPCT in the last 72 hours. Urine analysis:    Component Value Date/Time   COLORURINE YELLOW 12/08/2017 2141   APPEARANCEUR CLEAR 12/08/2017 2141   LABSPEC 1.014 12/08/2017 2141   PHURINE 5.0 12/08/2017 2141   GLUCOSEU NEGATIVE 12/08/2017 2141   HGBUR SMALL (A) 12/08/2017 2141   BILIRUBINUR NEGATIVE 12/08/2017 2141   KETONESUR 20 (A) 12/08/2017 2141   PROTEINUR NEGATIVE 12/08/2017 2141   NITRITE NEGATIVE 12/08/2017 2141   LEUKOCYTESUR NEGATIVE 12/08/2017 2141   Sepsis Labs: @LABRCNTIP (procalcitonin:4,lacticidven:4) )No results found for this or any previous visit (from the past 240 hour(s)).   Radiological Exams on Admission: Dg Chest 2 View  Result Date: 12/08/2017 CLINICAL DATA:  Acute onset of productive cough. Behavioral changes. Fever. EXAM: CHEST - 2 VIEW COMPARISON:  Chest radiograph performed 01/08/2010 FINDINGS: The lungs are well-aerated. Mild right basilar airspace opacity raises concern for pneumonia. Peribronchial thickening is noted. There is no evidence of pleural effusion or pneumothorax. The heart is normal in size; the mediastinal contour is within normal limits. No acute osseous abnormalities are seen. IMPRESSION: Mild right basilar airspace opacity raises concern for pneumonia. Peribronchial thickening noted. Electronically Signed   By: Garald Balding M.D.   On: 12/08/2017 21:28    EKG: Normal sinus rhythm at 93 bpm with normal axis and nonspecific ST-T wave changes.   Assessment/Plan  This is a 77 y.o. female with a history of anxiety, asthma, HLD, OA, migraines now being admitted with:  #. #. Sepsis secondary to CAP - Admit to inpatient with telemetry monitoring - IV antibiotics: Rocephin, Azithro - IV fluid hydration, O2, mednebs and expectorants as needed.  - Follow up blood, sputum cultures. Urine legionella and strep Ag.  - Check flu swab - Repeat CBC in am.    #. AMS likely 2/2 above - Head CT - Neurochecks q4h - Fall precautions -  Please place neuro consult in AM   #. History of anxiety/depression  - Continue Celexa  #. History of HLD - Continue lovastatin  #. History of asthma - Continue Advair, Singulair, Flonase, Zyrtec  Admission status: Inpatient IV Fluids: NS Diet/Nutrition: Heart healthy Consults called: Please call neuro in AM  DVT Px: Lovenox, SCDs and early ambulation. Code Status: Full Code  Disposition Plan: To home in 1-2 days  All the records are reviewed and case discussed with ED provider. Management plans discussed with the patient and/or family who express understanding and agree with plan of care.  Karthika Glasper D.O. on 12/09/2017 at 1:01 AM  CC: Primary care physician; Gayland Curry, DO   12/09/2017, 1:01 AM

## 2017-12-09 NOTE — Progress Notes (Signed)
@IPLOG @        PROGRESS NOTE                                                                                                                                                                                                             Patient Demographics:    Melinda Griffin, is a 77 y.o. female, DOB - January 29, 1941, ZHY:[id number]  Admit date - 12/08/2017   Admitting Physician Harvie Bridge, DO  Outpatient Primary MD for the patient is Gayland Curry, DO  LOS - 0  Chief Complaint  Patient presents with  . Urinary Tract Infection       Brief Narrative Melinda Griffin is a 77 y.o. female with a known history of anxiety, asthma, HLD, OA, migraines presents to the emergency department for evaluation of AMS.  Patient was in a usual state of health until two days ago when family noted her to be more confused than usual, has been working with a neurologist on mild cognitive impairment.  Has had decreased PO intake, generalized weakness, fatigue.  In the ER workup suggestive of right-sided pneumonia, head CT negative and she was admitted for further workup and treatment.     Subjective:    Melinda Griffin today has, No headache, No chest pain, No abdominal pain - No Nausea, No new weakness tingling or numbness, No Cough - SOB.  Mildly confused but able to answer questions reliably.   Assessment  & Plan :      1.  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  This appears to be community-acquired pneumonia although aspiration cannot be ruled out, currently on soft diet, speech to evaluate, continue empiric antibiotics which are now Unasyn and azithromycin, follow cultures.  Supportive care for encephalopathy, minimize benzodiazepines and narcotics use Haldol as needed if needed for agitation. No sepsis.  2. History of anxiety/depression  - Continue Celexa  3. Dyslipidemia - on statin.  4. H/O Asthma - no acute issues, supportive Rx.  5.  Early  dementia.  Supportive care see #1 above.   Diet : Fall precautions DIET SOFT Room service appropriate? Yes; Fluid consistency: Thin Aspiration precautions    Family Communication  : Message left for daughter at 11:50 AM  Code Status :  Full  Disposition Plan  :  TBD  Consults  :  None  Procedures  :    CT - No acute intracranial abnormalities  DVT Prophylaxis  :  Lovenox   Lab Results  Component Value Date  PLT 305 12/09/2017    Inpatient Medications  Scheduled Meds: . cholecalciferol  2,000 Units Oral Daily  . citalopram  40 mg Oral Daily  . dextromethorphan-guaiFENesin  1 tablet Oral BID  . enoxaparin (LOVENOX) injection  40 mg Subcutaneous Q24H  . fluticasone  1 spray Each Nare Daily  . fluticasone furoate-vilanterol  1 puff Inhalation Daily  . loratadine  10 mg Oral Daily  . montelukast  10 mg Oral QHS  . multivitamin with minerals   Oral Daily  . pravastatin  40 mg Oral q1800  . traZODone  25 mg Oral QHS   Continuous Infusions: . sodium chloride 75 mL/hr at 12/09/17 0404  . azithromycin     PRN Meds:.acetaminophen, albuterol, bisacodyl, ipratropium, magnesium citrate, ondansetron **OR** ondansetron (ZOFRAN) IV, senna-docusate  Antibiotics  :    Anti-infectives (From admission, onward)   Start     Dose/Rate Route Frequency Ordered Stop   12/09/17 2359  azithromycin (ZITHROMAX) 500 mg in sodium chloride 0.9 % 250 mL IVPB     500 mg 250 mL/hr over 60 Minutes Intravenous Every 24 hours 12/09/17 0324     12/09/17 2200  cefTRIAXone (ROCEPHIN) 1 g in sodium chloride 0.9 % 100 mL IVPB  Status:  Discontinued     1 g 200 mL/hr over 30 Minutes Intravenous Every 24 hours 12/09/17 0334 12/09/17 1148   12/08/17 2130  cefTRIAXone (ROCEPHIN) 2 g in sodium chloride 0.9 % 100 mL IVPB  Status:  Discontinued     2 g 200 mL/hr over 30 Minutes Intravenous Every 24 hours 12/08/17 2125 12/09/17 0334   12/08/17 2130  azithromycin (ZITHROMAX) 500 mg in sodium chloride 0.9 %  250 mL IVPB  Status:  Discontinued     500 mg 250 mL/hr over 60 Minutes Intravenous Every 24 hours 12/08/17 2125 12/09/17 0328         Objective:   Vitals:   12/09/17 0330 12/09/17 1011 12/09/17 1015 12/09/17 1045  BP: 131/67     Pulse: (!) 101     Resp: 18     Temp: (!) 101.2 F (38.4 C)     TempSrc: Oral     SpO2: 94% 93% 93% 93%  Weight:      Height:        Wt Readings from Last 3 Encounters:  12/08/17 54 kg (119 lb)  12/08/17 54 kg (119 lb)  10/13/17 55.8 kg (123 lb)     Intake/Output Summary (Last 24 hours) at 12/09/2017 1149 Last data filed at 12/09/2017 0556 Gross per 24 hour  Intake 1140 ml  Output 500 ml  Net 640 ml     Physical Exam  Awake , pleasantly confused but still Oriented X 2, No new F.N deficits, follows basic commands Lloyd.AT,PERRAL Supple Neck,No JVD, No cervical lymphadenopathy appriciated.  Symmetrical Chest wall movement, Good air movement bilaterally, CTAB RRR,No Gallops,Rubs or new Murmurs, No Parasternal Heave +ve B.Sounds, Abd Soft, No tenderness, No organomegaly appriciated, No rebound - guarding or rigidity. No Cyanosis, Clubbing or edema, No new Rash or bruise      Data Review:    CBC Recent Labs  Lab 12/08/17 1341 12/08/17 2141 12/09/17 0421  WBC 10.1 8.9 9.5  HGB 13.4 12.8 12.1  HCT 39.2 36.8 36.5  PLT 304 257 305  MCV 91.2 90.6 93.4  MCH 31.2 31.5 30.9  MCHC 34.2 34.8 33.2  RDW 12.7 13.5 13.4  LYMPHSABS 1,747 1.1  --   MONOABS  --  1.2*  --  EOSABS 283 0.0  --   BASOSABS 61 0.0  --     Chemistries  Recent Labs  Lab 12/08/17 2258 12/09/17 0421  NA 136 137  K 3.8 3.6  CL 104 103  CO2 20* 19*  GLUCOSE 122* 125*  BUN 12 9  CREATININE 0.65 0.59  CALCIUM 8.1* 8.6*  AST 26  --   ALT 16  --   ALKPHOS 60  --   BILITOT 0.9  --    ------------------------------------------------------------------------------------------------------------------ No results for input(s): CHOL, HDL, LDLCALC, TRIG, CHOLHDL,  LDLDIRECT in the last 72 hours.  Lab Results  Component Value Date   HGBA1C 6.1 (H) 10/09/2017   ------------------------------------------------------------------------------------------------------------------ Recent Labs    12/08/17 1341  TSH 0.77   ------------------------------------------------------------------------------------------------------------------ No results for input(s): VITAMINB12, FOLATE, FERRITIN, TIBC, IRON, RETICCTPCT in the last 72 hours.  Coagulation profile No results for input(s): INR, PROTIME in the last 168 hours.  No results for input(s): DDIMER in the last 72 hours.  Cardiac Enzymes No results for input(s): CKMB, TROPONINI, MYOGLOBIN in the last 168 hours.  Invalid input(s): CK ------------------------------------------------------------------------------------------------------------------ No results found for: BNP  Micro Results Recent Results (from the past 240 hour(s))  Culture, sputum-assessment     Status: None   Collection Time: 12/09/17  4:36 AM  Result Value Ref Range Status   Specimen Description SPUTUM  Final   Special Requests Normal  Final   Sputum evaluation   Final    THIS SPECIMEN IS ACCEPTABLE FOR SPUTUM CULTURE Performed at [facility], [place] [address] [place], [place] [address]    Report Status 12/09/2017 FINAL  Final    Radiology Reports Dg Chest 2 View  Result Date: 12/08/2017 CLINICAL DATA:  Acute onset of productive cough. Behavioral changes. Fever. EXAM: CHEST - 2 VIEW COMPARISON:  Chest radiograph performed 01/08/2010 FINDINGS: The lungs are well-aerated. Mild right basilar airspace opacity raises concern for pneumonia. Peribronchial thickening is noted. There is no evidence of pleural effusion or pneumothorax. The heart is normal in size; the mediastinal contour is within normal limits. No acute osseous abnormalities are seen. IMPRESSION: Mild right basilar airspace opacity raises concern for  pneumonia. Peribronchial thickening noted. Electronically Signed   By: Garald Balding M.D.   On: 12/08/2017 21:28   Ct Head Wo Contrast  Result Date: 12/09/2017 CLINICAL DATA:  Altered LOC EXAM: CT HEAD WITHOUT CONTRAST TECHNIQUE: Contiguous axial images were obtained from the base of the skull through the vertex without intravenous contrast. COMPARISON:  None. FINDINGS: Brain: Multiple series are degraded by patient motion which limits the study. No acute territorial infarction, hemorrhage or intracranial mass. Mild atrophy. Moderate small vessel ischemic changes of the white matter. Nonenlarged ventricles. Vascular: No hyperdense vessels. Scattered calcifications at the carotid siphons Skull: Obvious fracture Sinuses/Orbits: Fluid level in the left maxillary sinus. Mucosal thickening in the sphenoid and ethmoid sinuses. No acute orbital abnormality Other: None IMPRESSION: 1. Motion degraded study 2. No gross acute intracranial abnormality 3. Atrophy with small vessel ischemic changes of the white matter 4. Sinusitis Electronically Signed   By: Donavan Foil M.D.   On: 12/09/2017 02:20    Time Spent in minutes  30   Lala Lund M.D on 12/09/2017 at 11:49 AM  Between 7am to 7pm - Pager - [phone] ( page via [email or web address], text pages only, please mention full 10 digit call back number). After 7pm go to [email or web address] - password [facility]

## 2017-12-10 ENCOUNTER — Encounter: Payer: Self-pay | Admitting: Internal Medicine

## 2017-12-10 DIAGNOSIS — G92 Toxic encephalopathy: Secondary | ICD-10-CM

## 2017-12-10 DIAGNOSIS — F419 Anxiety disorder, unspecified: Secondary | ICD-10-CM

## 2017-12-10 DIAGNOSIS — G3184 Mild cognitive impairment, so stated: Secondary | ICD-10-CM

## 2017-12-10 DIAGNOSIS — J181 Lobar pneumonia, unspecified organism: Secondary | ICD-10-CM

## 2017-12-10 DIAGNOSIS — J189 Pneumonia, unspecified organism: Secondary | ICD-10-CM

## 2017-12-10 DIAGNOSIS — F329 Major depressive disorder, single episode, unspecified: Secondary | ICD-10-CM

## 2017-12-10 LAB — LEGIONELLA PNEUMOPHILA SEROGP 1 UR AG: L. PNEUMOPHILA SEROGP 1 UR AG: NEGATIVE

## 2017-12-10 LAB — CBC
HEMATOCRIT: 31.1 % — AB (ref 36.0–46.0)
Hemoglobin: 10.5 g/dL — ABNORMAL LOW (ref 12.0–15.0)
MCH: 31.1 pg (ref 26.0–34.0)
MCHC: 33.8 g/dL (ref 30.0–36.0)
MCV: 92 fL (ref 78.0–100.0)
Platelets: 289 10*3/uL (ref 150–400)
RBC: 3.38 MIL/uL — ABNORMAL LOW (ref 3.87–5.11)
RDW: 13.5 % (ref 11.5–15.5)
WBC: 9.7 10*3/uL (ref 4.0–10.5)

## 2017-12-10 LAB — MAGNESIUM: Magnesium: 1.8 mg/dL (ref 1.7–2.4)

## 2017-12-10 LAB — URINE CULTURE: Culture: NO GROWTH

## 2017-12-10 LAB — BASIC METABOLIC PANEL
Anion gap: 10 (ref 5–15)
BUN: 7 mg/dL (ref 6–20)
CHLORIDE: 107 mmol/L (ref 101–111)
CO2: 22 mmol/L (ref 22–32)
CREATININE: 0.58 mg/dL (ref 0.44–1.00)
Calcium: 8.3 mg/dL — ABNORMAL LOW (ref 8.9–10.3)
GFR calc non Af Amer: >60 mL/min (ref >60)
Glucose, Bld: 105 mg/dL — ABNORMAL HIGH (ref 65–99)
Potassium: 3.3 mmol/L — ABNORMAL LOW (ref 3.5–5.1)
Sodium: 139 mmol/L (ref 135–145)

## 2017-12-10 MED ORDER — POTASSIUM CHLORIDE CRYS ER 20 MEQ PO TBCR
40.0000 meq | EXTENDED_RELEASE_TABLET | Freq: Once | ORAL | Status: AC
  Administered 2017-12-10: 40 meq via ORAL
  Filled 2017-12-10: qty 2

## 2017-12-10 MED ORDER — ALBUTEROL SULFATE (2.5 MG/3ML) 0.083% IN NEBU: 2.5000 mg | INHALATION_SOLUTION | Freq: Four times a day (QID) | RESPIRATORY_TRACT | Status: DC | PRN
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
## 2017-12-10 NOTE — Progress Notes (Signed)
During a lengthy conversation with pt's daughter Zen concerning discharge plans. Zen, will take pt home with HHRN/PT/OT/SW and hire private duty sitters for pt. Pt will also go live with Zen who is HCPOA.

## 2017-12-11 ENCOUNTER — Inpatient Hospital Stay: Admission: RE | Admit: 2017-12-11 | Payer: Medicare Other | Source: Ambulatory Visit

## 2017-12-11 DIAGNOSIS — J189 Pneumonia, unspecified organism: Secondary | ICD-10-CM

## 2017-12-11 DIAGNOSIS — A419 Sepsis, unspecified organism: Principal | ICD-10-CM

## 2017-12-11 LAB — CULTURE, RESPIRATORY W GRAM STAIN

## 2017-12-11 LAB — CULTURE, RESPIRATORY
CULTURE: NORMAL
SPECIAL REQUESTS: NORMAL

## 2017-12-11 MED ORDER — AMOXICILLIN-POT CLAVULANATE 875-125 MG PO TABS
1.0000 | ORAL_TABLET | Freq: Two times a day (BID) | ORAL | Status: DC
  Administered 2017-12-11: 1 via ORAL
  Filled 2017-12-11: qty 1

## 2017-12-11 MED ORDER — AMOXICILLIN-POT CLAVULANATE 875-125 MG PO TABS: 1.0000 | ORAL_TABLET | Freq: Two times a day (BID) | ORAL | 0 refills | Status: AC

## 2017-12-11 MED ORDER — ALBUTEROL SULFATE HFA 108 (90 BASE) MCG/ACT IN AERS: 2.0000 | INHALATION_SPRAY | Freq: Four times a day (QID) | RESPIRATORY_TRACT | 2 refills | Status: DC | PRN

## 2017-12-11 MED ORDER — ENSURE ENLIVE PO LIQD: 237.0000 mL | Freq: Two times a day (BID) | ORAL | 0 refills | Status: DC

## 2017-12-11 NOTE — Progress Notes (Signed)
Physical Therapy Treatment Patient Details Name: Melinda Griffin MRN: [id number] DOB: 07/26/1941 Today's Date: 12/11/2017    History of Present Illness 77 y.o. female with a known history of anxiety, asthma, HLD, OA, migraines presents to the emergency department for evaluation of AMS and admitted for sepsis secondary to CAP    PT Comments    Pt declined ambulating in hallway and perseverated on going to bathroom and getting dressed.  Pt assisted with performing tasks around her room and appeared more calm end of session.  Pt eagerly awaiting daughter's arrival so she can d/c home.  SpO2 89% upon returning to bed on room air after walking around her room (RN notified).    Follow Up Recommendations  No PT follow up;Supervision for mobility/OOB     Equipment Recommendations  None recommended by PT    Recommendations for Other Services       Precautions / Restrictions Precautions Precautions: Fall Precaution Comments: monitor sats Restrictions Weight Bearing Restrictions: No    Mobility  Bed Mobility Overal bed mobility: Modified Independent                Transfers Overall transfer level: Needs assistance Equipment used: None Transfers: Sit to/from Stand Sit to Stand: Supervision         General transfer comment: supervision for safety due to hospital environment (floor mats, IV pole, telemetry)  Ambulation/Gait Ambulation/Gait assistance: Min guard;Supervision Ambulation Distance (Feet): 20 Feet Assistive device: None Gait Pattern/deviations: Decreased stride length;Step-through pattern     General Gait Details: short steps all around room, to bathroom, over to window seat, back to bed, assisted with lower body dressing per pt request (recommended sitting to don due to unsteady with SLS, pt agreed).   Stairs             Wheelchair Mobility    Modified Rankin (Stroke Patients Only)       Balance                                             Cognition Arousal/Alertness: Awake/alert   Overall Cognitive Status: No family/caregiver present to determine baseline cognitive functioning                                 General Comments: perseverating on getting dressed and going home, RN notified, pt reported feeling anxious but calm end of session      Exercises      General Comments        Pertinent Vitals/Pain Pain Assessment: No/denies pain    Home Living                      Prior Function            PT Goals (current goals can now be found in the care plan section) Progress towards PT goals: Progressing toward goals    Frequency    Min 3X/week      PT Plan Current plan remains appropriate    Co-evaluation              AM-PAC PT "6 Clicks" Daily Activity  Outcome Measure  Difficulty turning over in bed (including adjusting bedclothes, sheets and blankets)?: None Difficulty moving from lying on back to sitting on the side of the bed? :  None Difficulty sitting down on and standing up from a chair with arms (e.g., wheelchair, bedside commode, etc,.)?: A Little Help needed moving to and from a bed to chair (including a wheelchair)?: A Little Help needed walking in hospital room?: A Little Help needed climbing 3-5 steps with a railing? : A Little 6 Click Score: 20    End of Session   Activity Tolerance: Patient tolerated treatment well Patient left: in bed;with bed alarm set;with call bell/phone within reach Nurse Communication: Mobility status PT Visit Diagnosis: Difficulty in walking, not elsewhere classified (R26.2);Muscle weakness (generalized) (M62.81)     Time: 1216-2446 PT Time Calculation (min) (ACUTE ONLY): 14 min  Charges:  $Gait Training: 8-22 mins                    G Codes:       Carmelia Bake, PT, DPT 12/11/2017 Pager: [phone]  York Ram E 12/11/2017, 1:03 PM

## 2017-12-11 NOTE — Discharge Instructions (Signed)

## 2017-12-11 NOTE — Progress Notes (Signed)
CSW consulted for patient's social environment. Patient's RNCM spoke with patient's daughter at length regarding patient's social situation. Patient to discharge home with her daughter and home health services. Patient's RNCM reported patient has no SW needs. CSW signing off, no other needs identified at this time.   Abundio Miu, [place] Social Worker [facility] Cell#: [phone]
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
## 2017-12-13 LAB — CULTURE, BLOOD (ROUTINE X 2)
CULTURE: NO GROWTH
CULTURE: NO GROWTH
SPECIAL REQUESTS: ADEQUATE
Special Requests: ADEQUATE

## 2017-12-14 ENCOUNTER — Encounter: Payer: Self-pay | Admitting: Diagnostic Neuroimaging

## 2017-12-14 ENCOUNTER — Encounter: Payer: Self-pay | Admitting: Internal Medicine

## 2017-12-15 ENCOUNTER — Telehealth: Payer: Self-pay

## 2017-12-15 ENCOUNTER — Telehealth: Payer: Self-pay | Admitting: *Deleted

## 2017-12-15 ENCOUNTER — Encounter: Payer: Self-pay | Admitting: Internal Medicine

## 2017-12-15 NOTE — Telephone Encounter (Addendum)
Called to complete TCM call and make follow up visit with the doctor. I spoke with the daughter who asked to call back at a better time for her.

## 2017-12-15 NOTE — Telephone Encounter (Signed)
I have made the 2nd attempt to contact the patient or family member in charge, in order to follow up from recently being discharged from the hospital. I left a message on voicemail but I will make another attempt at a different time.  

## 2017-12-15 NOTE — Telephone Encounter (Signed)
Noted.  I do not know her current cognitive status after her hospitalization for pneumonia.  She's had some cognitive changes with acute delirium, but it's not clear without a visit if she is capable of making her own medical decisions.  It appears that the HCPOA has been changed to [place], her daughter, on the latest HCPOA and that the one her husband brought in was out of date.

## 2017-12-15 NOTE — Telephone Encounter (Signed)
Husband walked in asking for a letter stating pt is incompetent with making decisions, financial and personal. I advised to husband that daughter has HCPOA, Durable POA and Financial POA. Husband didn't know that and was very upset. The family is going thru personal matters concerning the patient. I advised to husband per verbal from Dr. Mariea Clonts that pt would need to be examined in order to have that letter written. Husband will try and get social services involved.

## 2017-12-16 ENCOUNTER — Telehealth: Payer: Self-pay | Admitting: *Deleted

## 2017-12-16 NOTE — Telephone Encounter (Signed)
[name] should be the one you are calling based on the legal documentation at this point.

## 2017-12-16 NOTE — Telephone Encounter (Signed)
Holly with [facility] called and stated that they did their evaluation today and need verbal order for Speech and OT. Verbal order given and they will be faxing order to be signed.

## 2017-12-16 NOTE — Telephone Encounter (Signed)
Okay thanks. She is the one I spoke to yesterday who stated she would call me back today to do TOC call and make TOC follow up visit

## 2017-12-17 DIAGNOSIS — G3184 Mild cognitive impairment, so stated: Secondary | ICD-10-CM | POA: Diagnosis not present

## 2017-12-17 DIAGNOSIS — M1990 Unspecified osteoarthritis, unspecified site: Secondary | ICD-10-CM | POA: Diagnosis not present

## 2017-12-17 DIAGNOSIS — Z87891 Personal history of nicotine dependence: Secondary | ICD-10-CM | POA: Diagnosis not present

## 2017-12-17 DIAGNOSIS — R1310 Dysphagia, unspecified: Secondary | ICD-10-CM | POA: Diagnosis not present

## 2017-12-17 DIAGNOSIS — R413 Other amnesia: Secondary | ICD-10-CM | POA: Diagnosis not present

## 2017-12-17 DIAGNOSIS — J45909 Unspecified asthma, uncomplicated: Secondary | ICD-10-CM | POA: Diagnosis not present

## 2017-12-17 DIAGNOSIS — E785 Hyperlipidemia, unspecified: Secondary | ICD-10-CM | POA: Diagnosis not present

## 2017-12-23 ENCOUNTER — Encounter: Payer: Self-pay | Admitting: Diagnostic Neuroimaging

## 2017-12-23 ENCOUNTER — Ambulatory Visit
Admission: RE | Admit: 2017-12-23 | Discharge: 2017-12-23 | Disposition: A | Discharge status: Home / Self Care | Payer: Medicare Other | Source: Ambulatory Visit | Attending: Diagnostic Neuroimaging | Admitting: Diagnostic Neuroimaging

## 2017-12-23 ENCOUNTER — Encounter: Payer: Self-pay | Admitting: Internal Medicine

## 2017-12-23 DIAGNOSIS — R413 Other amnesia: Secondary | ICD-10-CM

## 2017-12-25 ENCOUNTER — Encounter: Payer: Self-pay | Admitting: Nurse Practitioner

## 2017-12-25 ENCOUNTER — Ambulatory Visit (INDEPENDENT_AMBULATORY_CARE_PROVIDER_SITE_OTHER): Payer: Medicare Other | Admitting: Nurse Practitioner

## 2017-12-25 ENCOUNTER — Ambulatory Visit: Payer: Medicare Other | Admitting: Internal Medicine

## 2017-12-25 VITALS — BP 124/74 | HR 79 | Temp 98.2°F | Ht 62.0 in | Wt 116.0 lb

## 2017-12-25 DIAGNOSIS — E46 Unspecified protein-calorie malnutrition: Secondary | ICD-10-CM | POA: Diagnosis not present

## 2017-12-25 DIAGNOSIS — J4520 Mild intermittent asthma, uncomplicated: Secondary | ICD-10-CM

## 2017-12-25 DIAGNOSIS — J181 Lobar pneumonia, unspecified organism: Secondary | ICD-10-CM

## 2017-12-25 DIAGNOSIS — G3184 Mild cognitive impairment, so stated: Secondary | ICD-10-CM

## 2017-12-25 DIAGNOSIS — R634 Abnormal weight loss: Secondary | ICD-10-CM

## 2017-12-25 DIAGNOSIS — F3341 Major depressive disorder, recurrent, in partial remission: Secondary | ICD-10-CM

## 2017-12-25 DIAGNOSIS — J189 Pneumonia, unspecified organism: Secondary | ICD-10-CM

## 2017-12-25 LAB — BASIC METABOLIC PANEL WITH GFR
BUN: 16 mg/dL (ref 7–25)
CO2: 27 mmol/L (ref 20–32)
Calcium: 9.7 mg/dL (ref 8.6–10.4)
Chloride: 103 mmol/L (ref 98–110)
Creat: 0.62 mg/dL (ref 0.60–0.93)
GFR, EST AFRICAN AMERICAN: 100 mL/min/{1.73_m2} (ref >=60)
GFR, Est Non African American: 86 mL/min/{1.73_m2} (ref >=60)
GLUCOSE: 105 mg/dL — AB (ref 65–99)
Potassium: 4.5 mmol/L (ref 3.5–5.3)
SODIUM: 136 mmol/L (ref 135–146)

## 2017-12-25 LAB — CBC WITH DIFFERENTIAL/PLATELET
BASOS ABS: 57 {cells}/uL (ref 0–200)
Basophils Relative: 0.8 %
EOS ABS: 142 {cells}/uL (ref 15–500)
Eosinophils Relative: 2 %
HEMATOCRIT: 36.1 % (ref 35.0–45.0)
Hemoglobin: 12.4 g/dL (ref 11.7–15.5)
LYMPHS ABS: 1882 {cells}/uL (ref 850–3900)
MCH: 31 pg (ref 27.0–33.0)
MCHC: 34.3 g/dL (ref 32.0–36.0)
MCV: 90.3 fL (ref 80.0–100.0)
MPV: 10.2 fL (ref 7.5–12.5)
Monocytes Relative: 9.3 %
NEUTROS PCT: 61.4 %
Neutro Abs: 4359 cells/uL (ref 1500–7800)
PLATELETS: 388 10*3/uL (ref 140–400)
RBC: 4 10*6/uL (ref 3.80–5.10)
RDW: 13.5 % (ref 11.0–15.0)
TOTAL LYMPHOCYTE: 26.5 %
WBC: 7.1 10*3/uL (ref 3.8–10.8)
WBCMIX: 660 {cells}/uL (ref 200–950)

## 2017-12-25 MED ORDER — FLUTICASONE FUROATE-VILANTEROL 200-25 MCG/INH IN AEPB: 1.0000 | INHALATION_SPRAY | Freq: Every day | RESPIRATORY_TRACT | 2 refills | Status: DC

## 2017-12-25 NOTE — Progress Notes (Signed)
Careteam: Patient Care Team: Gayland Curry, DO as PCP - General (Geriatric Medicine)  Advanced Directive information    Allergies  Allergen Reactions  . 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 Aspirin     Advised by MD previously not to take  . Ceftin     Dizzy & Diarrhea  . Ciprofloxacin Hcl     Dizzy, diarrhea    Chief Complaint  Patient presents with  . Transitions Of Care    Pt is being seen for a TOC visit. Pt was admitted to [facility] 4/15 to 4/18  . Other    Daughter in room   HPI: Patient is a 77 y.o. female seen in the office today for hospital follow up.  Pt was hospitalized from 12/08/17-12/11/17 known history of anxiety, asthma, HLD, OA, migrainespresents to the emergency department for evaluation of AMS. She was admitted with sepsis secondary to CAP. Symptoms improved with treated with ceftriaxone and azithromycin initially and then transitioned to Unasyn to cover for possible anaerobes in setting of possible aspiration pneumonia. Discharged on Augmentin to complete a 7 day treatment course. Cough, congestion better. No shortness of breath.  Had home health for a short time. Having a hard time with her advair administration. Does not feel like she can actuate Advair due to her hand strength/arthritis.  Was recommended to follow up with pulmonary but never did. Reports she was given exercises but unsure what these actual were.   She is now home with daughter who is helping with her medications. Apparently she had not been taking her medication correctly. Daughter reports she is more coherent at this time. Eating better.  Sleeping good at night, not sleeping a lot during the day  Wanting to go back to the senior center to exercise.   Review of Systems:  Review of Systems  Constitutional: Negative for chills, fever and weight loss.  HENT: Negative for congestion, ear pain, sore throat and tinnitus.        Hoarse    Eyes: Negative.   Respiratory: Negative for  cough, sputum production and shortness of breath.   Cardiovascular: Negative for chest pain, palpitations and leg swelling.  Gastrointestinal: Negative for abdominal pain, constipation, diarrhea and heartburn.  Genitourinary: Negative for dysuria, frequency and urgency.  Musculoskeletal: Negative for back pain, falls, joint pain and myalgias.  Skin: Negative.   Neurological: Negative for dizziness and headaches.  Psychiatric/Behavioral: Positive for memory loss (overall memory has improved per daughter). Negative for depression. The patient does not have insomnia.     Past Medical History:  Diagnosis Date  . Allergic rhinitis   . Anxiety   . Asthma   . Hyperlipidemia   . Migraine with aura, without mention of intractable migraine without mention of status migrainosus    Past Surgical History:  Procedure Laterality Date  . APPENDECTOMY  1975  . BREAST BIOPSY Left 1976  . TUBAL LIGATION Bilateral 1976   Social History:   reports that she quit smoking about 56 years ago. Her smoking use included cigarettes. She has never used smokeless tobacco. She reports that she does not drink alcohol or use drugs.  Family History  Problem Relation Age of Onset  . Asthma Father   . Asthma Sister   . Heart disease Sister   . Cancer Mother        brain, breast  . Cancer Brother 21  . Heart disease Brother   . Hypertension Brother     Medications: Patient's Medications  New Prescriptions   No medications on file  Previous Medications   ACETAMINOPHEN (TYLENOL) 650 MG CR TABLET    Take 650 mg by mouth every 8 (eight) hours as needed for pain.   ADVAIR HFA 230-21 MCG/ACT INHALER    INHALE 2 PUFFS INTO THE LUNGS TWICE DAILY   ALBUTEROL (PROVENTIL HFA;VENTOLIN HFA) 108 (90 BASE) MCG/ACT INHALER    Inhale 2 puffs into the lungs every 6 (six) hours as needed for wheezing or shortness of breath.   BIOTIN PO    Take 2,000 mcg by mouth daily.   CALCIUM CARB-CHOLECALCIFEROL (CALCIUM 500 + D) 500-200  MG-UNIT TABS    Take 1 tablet by mouth daily.   CETIRIZINE (ZYRTEC) 10 MG TABLET    Take one tablet once daily for allergies   CHOLECALCIFEROL (VITAMIN D3) 2000 UNITS TABS    Take 1 tablet by mouth daily.    CITALOPRAM (CELEXA) 40 MG TABLET    Take 40 mg by mouth daily.   FEEDING SUPPLEMENT, ENSURE ENLIVE, (ENSURE ENLIVE) LIQD    Take 237 mLs by mouth 2 (two) times daily between meals.   FLUTICASONE (FLONASE) 50 MCG/ACT NASAL SPRAY    USE 1 SPRAY IN EACH NOSTRIL EVERY DAY AS NEEDED FOR ALLERGIES   LOVASTATIN (MEVACOR) 40 MG TABLET    TAKE 1 TABLET BY MOUTH EVERY DAY TO LOWER CHLOESTEROL   MONTELUKAST (SINGULAIR) 10 MG TABLET    TAKE 1 TABLET (10 MG TOTAL) BY MOUTH DAILY.   MULTIPLE VITAMINS-MINERALS (MULTI FOR HER 50+) CAPS    Take by mouth daily.  Modified Medications   No medications on file  Discontinued Medications   No medications on file     Physical Exam:  Vitals:   12/25/17 1608  BP: 124/74  Pulse: 79  Temp: 98.2 F (36.8 C)  TempSrc: Oral  SpO2: 96%  Weight: 116 lb (52.6 kg)  Height: 5\' 2"  (1.575 m)   Body mass index is 21.22 kg/m.  Physical Exam  Constitutional: She is oriented to person, place, and time. She appears well-developed and well-nourished. No distress.  HENT:  Head: Normocephalic and atraumatic.  Right Ear: External ear normal.  Left Ear: External ear normal.  Nose: Nose normal.  Mouth/Throat: Oropharynx is clear and moist. No oropharyngeal exudate.  Eyes: Pupils are equal, round, and reactive to light. Conjunctivae and EOM are normal.  Neck: Normal range of motion. Neck supple.  Cardiovascular: Normal rate, regular rhythm and normal heart sounds.  Pulmonary/Chest: Effort normal and breath sounds normal.  Musculoskeletal: She exhibits no edema or tenderness.  Neurological: She is alert and oriented to person, place, and time.  Skin: Skin is warm and dry. She is not diaphoretic.  Psychiatric: She has a normal mood and affect.    Labs  reviewed: Basic Metabolic Panel: Recent Labs    12/08/17 1341 12/08/17 2258 12/09/17 0421 12/10/17 0414  NA  --  136 137 139  K  --  3.8 3.6 3.3*  CL  --  104 103 107  CO2  --  20* 19* 22  GLUCOSE  --  122* 125* 105*  BUN  --  12 9 7   CREATININE  --  0.65 0.59 0.58  CALCIUM  --  8.1* 8.6* 8.3*  MG  --   --   --  1.8  TSH 0.77  --   --   --    Liver Function Tests: Recent Labs    04/11/17 0939 12/08/17 2258  AST 21  26  ALT 16 16  ALKPHOS 62 60  BILITOT 0.4 0.9  PROT 6.7 7.0  ALBUMIN 4.2 3.2*   No results for input(s): LIPASE, AMYLASE in the last 8760 hours. No results for input(s): AMMONIA in the last 8760 hours. CBC: Recent Labs    10/09/17 0931 12/08/17 1341 12/08/17 2141 12/09/17 0421 12/10/17 0414  WBC 7.3 10.1 8.9 9.5 9.7  NEUTROABS 4,154 6,908 6.5  --   --   HGB 13.2 13.4 12.8 12.1 10.5*  HCT 38.4 39.2 36.8 36.5 31.1*  MCV 91.6 91.2 90.6 93.4 92.0  PLT 443* 304 257 305 289   Lipid Panel: Recent Labs    04/11/17 0939 10/09/17 0931  CHOL 176 158  HDL 60 42*  LDLCALC 81 87  TRIG 174* 191*  CHOLHDL 2.9 3.8   TSH: Recent Labs    12/08/17 1341  TSH 0.77   A1C: Lab Results  Component Value Date   HGBA1C 6.1 (H) 10/09/2017     Assessment/Plan 1. Mild intermittent chronic asthma without complication -daughter concerned that she is not using advair correctly, with increase hoariness when she attempts to use device without assistance. Will change to breo to see if she is able to use device better. Pt and daughter will notify if symptoms increase or unable to use device correctly - fluticasone furoate-vilanterol (BREO ELLIPTA) 200-25 MCG/INH AEPB; Inhale 1 puff into the lungs daily.  Dispense: 28 each; Refill: 2  2. Community acquired pneumonia of right lower lobe of lung ([place]) Symptoms improved, no longer having cough, congestion, shortness of breath or fevers. - BASIC METABOLIC PANEL WITH GFR - CBC with Differential/Platelets  3. Weight  loss, non-intentional -ongoing weight loss however daughter reports eating better since home from the hospital and weight most likely on the rise. Will continue to follow, she is also receiving protein supplements.   4. Recurrent major depressive disorder, in partial remission (HCC) Improved mood. Continues on celexa daily   5. Mild cognitive impairment with memory loss -overall memory improved per daughter, more alert and oriented. Today she seems appropriate, following with neurology at this time.    Next appt: 05/05/2018, sooner if needed  Carlos American. Verdel, [facility] [phone]

## 2017-12-25 NOTE — Patient Instructions (Signed)
To stop ADVAIR and start BREO 1 puff daily into lungs Make sure to rinse mouth after use.

## 2017-12-25 NOTE — Telephone Encounter (Signed)
-----   Message -----   From: Lum Babe   Sent: 12/14/2017 12:57 AM EDT    To: Penni Bombard, MD  Subject: Visit Follow-Up Question    I just wanted to let you know my mother, Melinda Griffin, was discharged to my care on Thursday from the hospital. She will be rehabing with me and possibly living here permanently. [facility] is handling the in home care piece. She was not a candidate for Acute Inpatient Rehabilitation and did not want it anyhow. We will not make another appointment other than her regular follow up already scheduled unless I hear from you. We had to cancel her MRI due to her being in hospital. I will reschedule it and get it completed once she is able to be out and about. She is very weak and malnourished. But she is getting stronger daily. I will be checking her messages regularly. Thanks! [place]

## 2017-12-26 LAB — PREALBUMIN: Prealbumin: 29 mg/dL (ref 17–34)

## 2017-12-27 ENCOUNTER — Encounter: Payer: Self-pay | Admitting: Internal Medicine

## 2017-12-29 ENCOUNTER — Encounter: Payer: Self-pay | Admitting: *Deleted

## 2017-12-29 ENCOUNTER — Encounter: Payer: Self-pay | Admitting: Internal Medicine

## 2017-12-31 ENCOUNTER — Telehealth: Payer: Self-pay

## 2017-12-31 NOTE — Telephone Encounter (Signed)
Just as FYI for chart, it is really not relevant to her care whether or not Mr. Zahn has requested information.  We have not provided information due to HIPAA and documentation that [name] is now the [place].  I would like for [place] to remain outside of their personal and legal issues.  I am happy to assist in anyway relevant to Destenie's medical care.

## 2017-12-31 NOTE — Telephone Encounter (Signed)
Late entry: Patient and her daughter, [name] stopped by the office to see if patient's husband has been trying to request that patient be seen for a competenc evaluation. These requests are not in patient's chart and I informed patient and [name] that I could not give any information that may have been sent through the husband's chart due to [place]. They asked for verbal confirmation that anyone at this office has spoken to patient's husband about this issue and I again informed patient and [name] that I could not provide that information. They both verbalized understanding.   Patient and [name] may seek legal counsel as to how to access the information they are requesting.

## 2018-01-01 NOTE — Telephone Encounter (Signed)
Spoke with daughter and she already knew about the results, daughter states she understands.

## 2018-01-06 ENCOUNTER — Other Ambulatory Visit: Payer: Self-pay | Admitting: Internal Medicine

## 2018-01-06 NOTE — Telephone Encounter (Signed)
Per last hospital note med was changed 12/08/17

## 2018-01-07 ENCOUNTER — Other Ambulatory Visit: Payer: Self-pay | Admitting: Internal Medicine

## 2018-01-20 ENCOUNTER — Telehealth: Payer: Self-pay | Admitting: *Deleted

## 2018-01-20 NOTE — Telephone Encounter (Signed)
LMVM for pt on her daughter, Melinda Griffin's VM that her MRI brain did not show any major or acute findings.  Did show similar findings from CT of mild atropy and chronic SVD.  Continue current plan.  Appt 04-22-18 at 1400.  She is to call back if questions.

## 2018-01-20 NOTE — Telephone Encounter (Signed)
-----   Message from Penni Bombard, MD sent at 01/16/2018  1:45 PM EDT ----- Mild atrophy and chronic small vessel ischemic disease. No major or acute findings. Please call patient. Continue current plan. -VRP

## 2018-01-26 ENCOUNTER — Telehealth: Payer: Self-pay | Admitting: Internal Medicine

## 2018-01-26 NOTE — Telephone Encounter (Signed)
I left a message asking the pt to call me at [phone] to schedule AWV with Clarise Cruz that's due now. VDM (DD)

## 2018-01-29 ENCOUNTER — Encounter: Payer: Self-pay | Admitting: Diagnostic Neuroimaging

## 2018-01-29 ENCOUNTER — Encounter: Payer: Self-pay | Admitting: Internal Medicine

## 2018-02-01 ENCOUNTER — Encounter: Payer: Self-pay | Admitting: Internal Medicine

## 2018-02-02 NOTE — Telephone Encounter (Signed)
Routed to Dee-Dee Thersa Salt) and Karena Addison (Dr.Reed's assistant)

## 2018-03-03 ENCOUNTER — Other Ambulatory Visit: Payer: Self-pay | Admitting: Internal Medicine

## 2018-03-11 ENCOUNTER — Encounter: Payer: Self-pay | Admitting: Diagnostic Neuroimaging

## 2018-03-26 DIAGNOSIS — L820 Inflamed seborrheic keratosis: Secondary | ICD-10-CM | POA: Diagnosis not present

## 2018-03-26 DIAGNOSIS — D485 Neoplasm of uncertain behavior of skin: Secondary | ICD-10-CM | POA: Diagnosis not present

## 2018-03-26 DIAGNOSIS — L821 Other seborrheic keratosis: Secondary | ICD-10-CM | POA: Diagnosis not present

## 2018-03-26 DIAGNOSIS — D225 Melanocytic nevi of trunk: Secondary | ICD-10-CM | POA: Diagnosis not present

## 2018-03-30 ENCOUNTER — Other Ambulatory Visit: Payer: Self-pay | Admitting: Nurse Practitioner

## 2018-03-30 DIAGNOSIS — J4520 Mild intermittent asthma, uncomplicated: Secondary | ICD-10-CM

## 2018-04-21 DIAGNOSIS — H2513 Age-related nuclear cataract, bilateral: Secondary | ICD-10-CM | POA: Diagnosis not present

## 2018-04-22 ENCOUNTER — Ambulatory Visit: Payer: Medicare Other | Admitting: Diagnostic Neuroimaging

## 2018-04-22 ENCOUNTER — Encounter: Payer: Self-pay | Admitting: Diagnostic Neuroimaging

## 2018-04-22 VITALS — BP 122/62 | HR 78 | Ht 62.0 in | Wt 118.0 lb

## 2018-04-22 DIAGNOSIS — F32A Depression, unspecified: Secondary | ICD-10-CM

## 2018-04-22 DIAGNOSIS — F03A0 Unspecified dementia, mild, without behavioral disturbance, psychotic disturbance, mood disturbance, and anxiety: Secondary | ICD-10-CM

## 2018-04-22 DIAGNOSIS — F329 Major depressive disorder, single episode, unspecified: Secondary | ICD-10-CM

## 2018-04-22 DIAGNOSIS — F0390 Unspecified dementia without behavioral disturbance: Secondary | ICD-10-CM | POA: Diagnosis not present

## 2018-04-22 DIAGNOSIS — R413 Other amnesia: Secondary | ICD-10-CM

## 2018-04-22 NOTE — Progress Notes (Addendum)
[facility]  PATIENT: Melinda Griffin DOB: [birth year]  REFERRING CLINICIAN: Mariea Griffin  / Melinda Griffin HISTORY FROM: patient and daughter (Melinda Griffin) REASON FOR VISIT: follow up    HISTORICAL  CHIEF COMPLAINT:  Chief Complaint  Patient presents with  . Follow-up    Here for 9 month f/u daughter in the lobby   . Memory Loss    HISTORY OF PRESENT ILLNESS:   UPDATE (04/22/18, VRP): Since last visit, doing poorly. Symptoms are progresive. Now living with her daughter Melinda Griffin) who is also HCPOA.  According to daughter patient is having significant progression in expressive and receptive communication, difficulty following directions, repeating herself, confused with regards to day of week, place, time, resistant to absorbing new information, perseverating on other issues, having trouble with laundry and dressing.  Patient still drives to church and local areas but daughter is concerned about safety.  Long-term memories are relatively intact but sometimes she does forget important days.  Short-term memory is severely affected.  Also with progressively worsening depression, anxiety, social appropriateness.  No other alleviating or aggravating factors. Tolerating meds.   UPDATE (07/23/17, VRP): Since last visit, doing well. Maintains her ADLs. Active at church. Taking a class at church. There is significant psychosocial stress factors esp related to her husband and grandchildren and financial factors. No alleviating or aggravating factors. She has occ word finding diff, esp when stressed.  PRIOR HPI (01/09/15): 77 year old female here for evaluation of memory problems. Patient developed some mild memory problems around 2010, with some difficulty remembering names, word finding, taking more time to prepare meals. However she did not have significant difficulty with most of her activities of daily living. Patient minimizes any significant memory problems. Apparently her family was more concerned.  Through the help of her daughter, patient enrolled into some type of research study at the [facility] with Dr. Alford Griffin (2012), who evaluated patient with PET scan and neuropsychological testing. Patient was found to have some mild cognitive deficiencies mainly in areas of memory and executive functioning. She was diagnosed with mild cognitive impairment. She was managed conservatively. Patient followed up with Dr. Lavone Griffin in 2014 at [facility], who also agreed with the diagnosis. Since that time patient has retired earlier in 2016, on her own terms. She was doing well with her work-related performance. She has retired to spend more time with her family enjoy vacation and travel.   REVIEW OF SYSTEMS: Full 14 system review of systems performed and negative except: memory loss agitation depression anxiety appetite change hearing loss back pain neck pain insomnia sleep talking.    ALLERGIES: Allergies  Allergen Reactions  . 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 Aspirin     Advised by MD previously not to take  . Ceftin     Dizzy & Diarrhea  . Ciprofloxacin Hcl     Dizzy, diarrhea    HOME MEDICATIONS: Outpatient Medications Prior to Visit  Medication Sig Dispense Refill  . acetaminophen (TYLENOL) 650 MG CR tablet Take 650 mg by mouth every 8 (eight) hours as needed for pain.    Marland Kitchen albuterol (PROVENTIL HFA;VENTOLIN HFA) 108 (90 Base) MCG/ACT inhaler Inhale 2 puffs into the lungs every 6 (six) hours as needed for wheezing or shortness of breath. 1 Inhaler 2  . BIOTIN PO Take 2,000 mcg by mouth daily.    Marland Kitchen BREO ELLIPTA 200-25 MCG/INH AEPB TAKE 1 PUFF BY MOUTH EVERY DAY 30 each 1  . Calcium Carb-Cholecalciferol (CALCIUM 500 + D) 500-200 MG-UNIT TABS Take  1 tablet by mouth daily.    . CELEXA 40 MG tablet TAKE 1 TABLET BY MOUTH ONCE DAILY 90 tablet 0  . cetirizine (ZYRTEC) 10 MG tablet Take one tablet once daily for allergies    . Cholecalciferol (VITAMIN D3) 2000 UNITS TABS Take 1 tablet by mouth daily.       . feeding supplement, ENSURE ENLIVE, (ENSURE ENLIVE) LIQD Take 237 mLs by mouth 2 (two) times daily between meals. 60 Bottle 0  . fluticasone (FLONASE) 50 MCG/ACT nasal spray USE 1 SPRAY IN EACH NOSTRIL EVERY DAY AS NEEDED FOR ALLERGIES 16 g 5  . lovastatin (MEVACOR) 40 MG tablet TAKE 1 TABLET BY MOUTH EVERY DAY TO LOWER CHOLESTEROL 30 tablet 3  . montelukast (SINGULAIR) 10 MG tablet TAKE 1 TABLET (10 MG TOTAL) BY MOUTH DAILY. 90 tablet 0  . Multiple Vitamins-Minerals (MULTI FOR HER 50+) CAPS Take by mouth daily.     No facility-administered medications prior to visit.     PAST MEDICAL HISTORY: Past Medical History:  Diagnosis Date  . Allergic rhinitis   . Anxiety   . Asthma   . Hyperlipidemia   . Migraine with aura, without mention of intractable migraine without mention of status migrainosus     PAST SURGICAL HISTORY: Past Surgical History:  Procedure Laterality Date  . APPENDECTOMY  1975  . BREAST BIOPSY Left 1976  . TUBAL LIGATION Bilateral 1976    FAMILY HISTORY: Family History  Problem Relation Age of Onset  . Asthma Father   . Asthma Sister   . Heart disease Sister   . Cancer Mother        brain, breast  . Cancer Brother 59  . Heart disease Brother   . Hypertension Brother     SOCIAL HISTORY:  Social History   Socioeconomic History  . Marital status: Married    Spouse name: Melinda Griffin  . Number of children: 2  . Years of education: college   . Highest education level: Not on file  Occupational History  . Not on file  Social Needs  . Financial resource strain: Not on file  . Food insecurity:    Worry: Not on file    Inability: Not on file  . Transportation needs:    Medical: Not on file    Non-medical: Not on file  Tobacco Use  . Smoking status: Former Smoker    Types: Cigarettes    Last attempt to quit: 02/07/1961    Years since quitting: 57.2  . Smokeless tobacco: Never Used  Substance and Sexual Activity  . Alcohol use: No    Alcohol/week:  0.0 standard drinks  . Drug use: No  . Sexual activity: Not on file  Lifestyle  . Physical activity:    Days per week: Not on file    Minutes per session: Not on file  . Stress: Not on file  Relationships  . Social connections:    Talks on phone: Not on file    Gets together: Not on file    Attends religious service: Not on file    Active member of club or organization: Not on file    Attends meetings of clubs or organizations: Not on file    Relationship status: Not on file  . Intimate partner violence:    Fear of current or ex partner: Not on file    Emotionally abused: Not on file    Physically abused: Not on file    Forced sexual activity: Not on file  Other Topics Concern  . Not on file  Social History Narrative   Lives at home with Husband   Drinks 1 cup of coffee a day and 1 diet soda a day    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 She previously lived in [place]. Previously has traveled to [place], [place], [place], [place], [place], [place], [place], [place], [place], [place], & [place]. She has traveled all the way up the [facility] to [place]. Hasn't traveled much in the [place] except for [place]. Prior travel to [place] & [place]. She currently has 2 dogs & 1 cat. No bird exposure. Previously worked as a Freight forwarder in [employer]. No mold or hot tub exposure. She does have a swimming pool & coy pond.      PHYSICAL EXAM  GENERAL EXAM/CONSTITUTIONAL: Vitals:  Vitals:   04/22/18 1343  BP: 122/62  Pulse: 78  Weight: 118 lb (53.5 kg)  Height: 5\' 2"  (1.575 m)     Body mass index is 21.58 kg/m. Wt Readings from Last 3 Encounters:  04/22/18 118 lb (53.5 kg)  12/25/17 116 lb (52.6 kg)  12/11/17 121 lb 4.1 oz (55 kg)     Patient is in no distress; well developed, nourished and groomed; neck is supple  CARDIOVASCULAR:  Examination of carotid arteries is normal; no carotid bruits  Regular rate and rhythm, no murmurs  Examination of peripheral vascular system by observation  and palpation is normal  EYES:  Ophthalmoscopic exam of optic discs and posterior segments is normal; no papilledema or hemorrhages  No exam data present  MUSCULOSKELETAL:  Gait, strength, tone, movements noted in Neurologic exam below  NEUROLOGIC: MENTAL STATUS:  MMSE - [place] Exam 04/22/2018 07/23/2017 12/05/2016  Orientation to time 4 5 5   Orientation to Place 5 5 5   Registration 3 3 3   Attention/ Calculation 3 3 5   Recall 1 3 1   Language- name 2 objects 2 2 2   Language- repeat 0 1 1  Language- follow 3 step command 3 3 3   Language- read & follow direction 1 1 1   Write a sentence 1 1 1   Copy design 0 0 1  Total score 23 27 28     awake, alert, oriented to person, place  DECR memory   DECR attention and concentration  language fluent, comprehension intact, naming intact; SOME EXPRESSIVE APHASIA (WORD SUBSTITUTION; INCORRECT GRAMMAR)  fund of knowledge appropriate  CRANIAL NERVE:   2nd - no papilledema on fundoscopic exam  2nd, 3rd, 4th, 6th - pupils equal and reactive to light, visual fields full to confrontation, extraocular muscles intact, no nystagmus  5th - facial sensation symmetric  7th - facial strength symmetric  8th - hearing intact  9th - palate elevates symmetrically, uvula midline  11th - shoulder shrug symmetric  12th - tongue protrusion midline  MOTOR:   normal bulk and tone, full strength in the BUE, BLE  SENSORY:   normal and symmetric to light touch, pinprick, temperature, vibration  COORDINATION:   finger-nose-finger, fine finger movements normal  REFLEXES:   deep tendon reflexes present and symmetric  GAIT/STATION:   narrow based gait; able to walk on toes, heels and tandem; romberg is negative      DIAGNOSTIC DATA (LABS, IMAGING, TESTING) - I reviewed patient records, labs, notes, testing and imaging myself where available.  Lab Results  Component Value Date   WBC 7.1 12/25/2017   HGB 12.4 12/25/2017  HCT 36.1 12/25/2017   MCV 90.3 12/25/2017   PLT 388 12/25/2017      Component Value Date/Time   NA 136 12/25/2017 1634   NA 142 10/18/2015 1036   K 4.5 12/25/2017 1634   CL 103 12/25/2017 1634   CO2 27 12/25/2017 1634   GLUCOSE 105 (H) 12/25/2017 1634   BUN 16 12/25/2017 1634   BUN 11 10/18/2015 1036   CREATININE 0.62 12/25/2017 1634   CALCIUM 9.7 12/25/2017 1634   PROT 7.0 12/08/2017 2258   PROT 6.7 10/18/2015 1036   ALBUMIN 3.2 (L) 12/08/2017 2258   ALBUMIN 4.3 10/18/2015 1036   AST 26 12/08/2017 2258   ALT 16 12/08/2017 2258   ALKPHOS 60 12/08/2017 2258   BILITOT 0.9 12/08/2017 2258   BILITOT 0.3 10/18/2015 1036   GFRNONAA 86 12/25/2017 1634   GFRAA 100 12/25/2017 1634   Lab Results  Component Value Date   CHOL 158 10/09/2017   HDL 42 (L) 10/09/2017   LDLCALC 87 10/09/2017   TRIG 191 (H) 10/09/2017   CHOLHDL 3.8 10/09/2017   Lab Results  Component Value Date   HGBA1C 6.1 (H) 10/09/2017   No results found for: [id number] Lab Results  Component Value Date   TSH 0.77 12/08/2017     05/28/11 MRI brain  - Small vessel occlusive disease. Moderate inflamm changes of the paranasal sinuses.     ASSESSMENT AND PLAN  77 y.o. year old female here with mild memory problems since 2010, still independent with ADLs. No significant progression 2012 - 2016, and previously dx'd with MCI. Then with progression of symptoms, likely due to underlying neurodegenerative process.    Dx: mild dementia (+ psychosocial stress, + depression / anxiety, + insomnia, + pain)  Memory loss  Mild dementia  Depression, unspecified depression type - Plan: Ambulatory referral to Psychology    PLAN:  I spent 25 minutes of face to face time with patient. Greater than 50% of time was spent in counseling and coordination of care with patient. This is necessary because patient's symptoms are not optimally controlled / improved. In summary we discussed: - Diagnostic results, impressions,  or recommended diagnostic studies: dementia, depression, anxiety - Prognosis: fair-guarded - Patient and family education: safety concerns reviewed  - consider memantine 10mg  at bedtime; increase to twice a day after 1-2 weeks - refer to psychology (depression / anxiety) - safety / supervision issues reviewed - caregiver resources provided - caution with driving and finances - continue focusing on healthy nutrition, daily physical activity, mental and socially stimulating activities  Return if symptoms worsen or fail to improve, for return to PCP.     Penni Bombard, MD 11/25/270, 5:36 PM Certified in Neurology, Neurophysiology and Neuroimaging  [facility] [address], [place], [place] [address] [phone]

## 2018-04-22 NOTE — Patient Instructions (Signed)
-   consider memantine 10mg  at bedtime; increase to twice a day after 1-2 weeks  - refer to psychology (depression / anxiety)  - safety / supervision issues reviewed  - caregiver resources provided  - caution with driving and finances  - continue focusing on healthy nutrition, daily physical activity, mental and socially stimulating activities

## 2018-04-30 ENCOUNTER — Encounter: Payer: Medicare Other | Admitting: Internal Medicine

## 2018-05-03 ENCOUNTER — Other Ambulatory Visit: Payer: Self-pay | Admitting: Internal Medicine

## 2018-05-04 ENCOUNTER — Ambulatory Visit: Payer: Medicare Other

## 2018-05-04 ENCOUNTER — Ambulatory Visit (INDEPENDENT_AMBULATORY_CARE_PROVIDER_SITE_OTHER): Payer: Medicare Other

## 2018-05-04 VITALS — BP 120/65 | HR 70 | Temp 98.0°F | Ht 62.0 in | Wt 115.0 lb

## 2018-05-04 DIAGNOSIS — R739 Hyperglycemia, unspecified: Secondary | ICD-10-CM

## 2018-05-04 DIAGNOSIS — J4520 Mild intermittent asthma, uncomplicated: Secondary | ICD-10-CM | POA: Diagnosis not present

## 2018-05-04 DIAGNOSIS — Z23 Encounter for immunization: Secondary | ICD-10-CM

## 2018-05-04 DIAGNOSIS — E7800 Pure hypercholesterolemia, unspecified: Secondary | ICD-10-CM

## 2018-05-04 DIAGNOSIS — Z0000 Encounter for general adult medical examination without abnormal findings: Secondary | ICD-10-CM | POA: Diagnosis not present

## 2018-05-04 LAB — LIPID PANEL
Cholesterol: 178 mg/dL (ref <200)
HDL: 62 mg/dL (ref >50)
LDL Cholesterol (Calc): 92 mg/dL (calc)
Non-HDL Cholesterol (Calc): 116 mg/dL (calc) (ref <130)
Total CHOL/HDL Ratio: 2.9 (calc) (ref <5.0)
Triglycerides: 138 mg/dL (ref <150)

## 2018-05-04 LAB — COMPLETE METABOLIC PANEL WITH GFR
AG Ratio: 1.4 (calc) (ref 1.0–2.5)
ALT: 11 U/L (ref 6–29)
AST: 18 U/L (ref 10–35)
Albumin: 4.2 g/dL (ref 3.6–5.1)
Alkaline phosphatase (APISO): 57 U/L (ref 33–130)
BUN: 13 mg/dL (ref 7–25)
CO2: 28 mmol/L (ref 20–32)
Calcium: 9.7 mg/dL (ref 8.6–10.4)
Chloride: 104 mmol/L (ref 98–110)
Creat: 0.84 mg/dL (ref 0.60–0.93)
GFR, Est African American: 77 mL/min/{1.73_m2} (ref >=60)
GFR, Est Non African American: 66 mL/min/{1.73_m2} (ref >=60)
Globulin: 2.9 g/dL (calc) (ref 1.9–3.7)
Glucose, Bld: 90 mg/dL (ref 65–99)
Potassium: 4.5 mmol/L (ref 3.5–5.3)
Sodium: 140 mmol/L (ref 135–146)
Total Bilirubin: 0.4 mg/dL (ref 0.2–1.2)
Total Protein: 7.1 g/dL (ref 6.1–8.1)

## 2018-05-04 LAB — CBC WITH DIFFERENTIAL/PLATELET
Basophils Absolute: 58 cells/uL (ref 0–200)
Basophils Relative: 0.9 %
Eosinophils Absolute: 390 cells/uL (ref 15–500)
Eosinophils Relative: 6.1 %
HCT: 40.9 % (ref 35.0–45.0)
Hemoglobin: 13.7 g/dL (ref 11.7–15.5)
Lymphs Abs: 1888 cells/uL (ref 850–3900)
MCH: 31.1 pg (ref 27.0–33.0)
MCHC: 33.5 g/dL (ref 32.0–36.0)
MCV: 92.7 fL (ref 80.0–100.0)
MPV: 10.2 fL (ref 7.5–12.5)
Monocytes Relative: 9.3 %
Neutro Abs: 3469 cells/uL (ref 1500–7800)
Neutrophils Relative %: 54.2 %
Platelets: 277 10*3/uL (ref 140–400)
RBC: 4.41 10*6/uL (ref 3.80–5.10)
RDW: 12.1 % (ref 11.0–15.0)
Total Lymphocyte: 29.5 %
WBC mixed population: 595 cells/uL (ref 200–950)
WBC: 6.4 10*3/uL (ref 3.8–10.8)

## 2018-05-04 LAB — HEMOGLOBIN A1C
Hgb A1c MFr Bld: 5.7 % of total Hgb — ABNORMAL HIGH (ref <5.7)
Mean Plasma Glucose: 117 (calc)
eAG (mmol/L): 6.5 (calc)

## 2018-05-04 NOTE — Progress Notes (Signed)
Subjective:   Melinda Griffin is a 77 y.o. female who presents for Medicare Annual (Subsequent) preventive examination.  Last AWV-12/05/2016    Objective:     Vitals: BP 120/65 (BP Location: Left Arm, Patient Position: Sitting)   Pulse 70   Temp 98 F (36.7 C) (Oral)   Ht 5\' 2"  (1.575 m)   Wt 115 lb (52.2 kg)   BMI 21.03 kg/m   Body mass index is 21.03 kg/m.  Advanced Directives 05/04/2018 12/09/2017 12/09/2017 12/09/2017 12/08/2017 10/13/2017 04/14/2017  Does Patient Have a Medical Advance Directive? Yes Yes Yes Yes Yes Yes Yes  Type of Advance Directive Living will;Healthcare Power of [place];Living will [place];Living will [place];Living will Healthcare Power of [place] of [place]  Does patient want to make changes to medical advance directive? No - Patient declined No - Patient declined No - Patient declined - No - Patient declined No - Patient declined -  Copy of [place] in Chart? Yes Yes No - copy requested No - copy requested Yes Yes Yes  Would patient like information on creating a medical advance directive? - - - - - - -    Tobacco Social History   Tobacco Use  Smoking Status Former Smoker  . Types: Cigarettes  . Last attempt to quit: 02/07/1961  . Years since quitting: 57.2  Smokeless Tobacco Never Used     Counseling given: Not Answered   Clinical Intake:  Pre-visit preparation completed: No  Pain : No/denies pain     Nutritional Risks: None Diabetes: No  How often do you need to have someone help you when you read instructions, pamphlets, or other written materials from your doctor or pharmacy?: 1 - Never What is the last grade level you completed in school?: 2 years colleg  Interpreter Needed?: No  Information entered by :: [name], RN  Past Medical History:  Diagnosis Date  . Allergic rhinitis   .  Anxiety   . Asthma   . Hyperlipidemia   . Migraine with aura, without mention of intractable migraine without mention of status migrainosus    Past Surgical History:  Procedure Laterality Date  . APPENDECTOMY  1975  . BREAST BIOPSY Left 1976  . TUBAL LIGATION Bilateral 1976   Family History  Problem Relation Age of Onset  . Asthma Father   . Asthma Sister   . Heart disease Sister   . Cancer Mother        brain, breast  . Cancer Brother 38  . Heart disease Brother   . Hypertension Brother    Social History   Socioeconomic History  . Marital status: Married    Spouse name: [name]  . Number of children: 2  . Years of education: college   . Highest education level: Not on file  Occupational History  . Not on file  Social Needs  . Financial resource strain: Not hard at all  . Food insecurity:    Worry: Never true    Inability: Never true  . Transportation needs:    Medical: No    Non-medical: No  Tobacco Use  . Smoking status: Former Smoker    Types: Cigarettes    Last attempt to quit: 02/07/1961    Years since quitting: 57.2  . Smokeless tobacco: Never Used  Substance and Sexual Activity  . Alcohol use: No    Alcohol/week: 0.0 standard drinks  .  Drug use: No  . Sexual activity: Not on file  Lifestyle  . Physical activity:    Days per week: 3 days    Minutes per session: 60 min  . Stress: Only a little  Relationships  . Social connections:    Talks on phone: More than three times a week    Gets together: More than three times a week    Attends religious service: Never    Active member of club or organization: No    Attends meetings of clubs or organizations: Never    Relationship status: Married  Other Topics Concern  . 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 She previously lived in [place]. Previously has  traveled to [place], [place], [place], [place], [place], [place], [place], [place], [place], [place], & [place]. She has traveled all the way up the [facility] to [place]. Hasn't traveled much in the [place] except for [place]. Prior travel to [place] & [place]. She currently has 2 dogs & 1 cat. No bird exposure. Previously worked as a Freight forwarder in [employer]. No mold or hot tub exposure. She does have a swimming pool & coy pond.     Outpatient Encounter Medications as of 05/04/2018  Medication Sig  . BIOTIN PO Take 2,000 mcg by mouth daily.  Marland Kitchen BIOTIN PO Take 2,000 mcg by mouth daily.  Marland Kitchen BREO ELLIPTA 200-25 MCG/INH AEPB TAKE 1 PUFF BY MOUTH EVERY DAY  . Calcium Carb-Cholecalciferol (CALCIUM 500 + D) 500-200 MG-UNIT TABS Take 1 tablet by mouth daily.  . cetirizine (ZYRTEC) 10 MG tablet Take one tablet once daily for allergies  . Cholecalciferol (VITAMIN D3) 2000 UNITS TABS Take 1 tablet by mouth daily.   . citalopram (CELEXA) 40 MG tablet TAKE 1 TABLET BY MOUTH ONCE DAILY  . fluticasone (FLONASE) 50 MCG/ACT nasal spray USE 1 SPRAY IN EACH NOSTRIL EVERY DAY AS NEEDED FOR ALLERGIES  . lovastatin (MEVACOR) 40 MG tablet TAKE 1 TABLET BY MOUTH EVERY DAY TO LOWER CHOLESTEROL  . montelukast (SINGULAIR) 10 MG tablet TAKE 1 TABLET BY MOUTH EVERY DAY  . NON FORMULARY Pure protein  . NON FORMULARY CBD 10 mg 1 in the am 1 in the pm   No facility-administered encounter medications on file as of 05/04/2018.     Activities of Daily Living In your present state of health, do you have any difficulty performing the following activities: 05/04/2018 12/09/2017  Hearing? N Y  Vision? N N  Difficulty concentrating or making decisions? N Y  Walking or climbing stairs? N N  Dressing or bathing? N Y  Doing errands, shopping? N N  Preparing Food and eating ? N -  Using the Toilet? N -  In the past six months, have you accidently leaked urine? N -  Do you have problems with loss of bowel control? N -  Managing your Medications? N -  Managing your  Finances? N -  Housekeeping or managing your Housekeeping? N -  Some recent data might be hidden    Patient Care Team: Gayland Curry, DO as PCP - General (Geriatric Medicine)    Assessment:   This is a routine wellness examination for [place].  Exercise Activities and Dietary recommendations Current Exercise Habits: Home exercise routine, Type of exercise: strength  training/weights;stretching, Time (Minutes): 60, Frequency (Times/Week): 3, Weekly Exercise (Minutes/Week): 180, Intensity: Mild, Exercise limited by: None identified  Goals    . Spiritual Goal     Starting 12/05/16 I would like to focus on my spiritual health with [employer].       Fall Risk Fall Risk  05/04/2018 12/25/2017 12/08/2017 10/13/2017 04/14/2017  Falls in the past year? No No No No No  Number falls in past yr: - - - - -  Injury with Fall? - - - - -  Comment - - - - -   Is the patient's home free of loose throw rugs in walkways, pet beds, electrical cords, etc?   yes      Grab bars in the bathroom? no      Handrails on the stairs?   yes      Adequate lighting?   yes  Depression Screen PHQ 2/9 Scores 05/04/2018 12/08/2017 10/13/2017 04/14/2017  PHQ - 2 Score 0 0 0 0     Cognitive Function MMSE - Mini Mental State Exam 04/22/2018 07/23/2017 12/05/2016 10/07/2016 01/09/2015  Orientation to time 4 5 5 4 5   Orientation to Place 5 5 5 4 5   Registration 3 3 3 3 3   Attention/ Calculation 3 3 5 5 4   Recall 1 3 1  0 1  Language- name 2 objects 2 2 2 2 2   Language- repeat 0 1 1 1  0  Language- follow 3 step command 3 3 3 3 3   Language- read & follow direction 1 1 1 1 1   Write a sentence 1 1 1 1 1   Copy design 0 0 1 1 1   Total score 23 27 28 25 26         Immunization History  Administered Date(s) Administered  . Influenza Split 04/27/2012  . Influenza Whole 05/26/2009, 04/26/2010, 05/27/2011  . Influenza, High Dose Seasonal PF 05/17/2017, 04/23/2018  . Influenza-Unspecified 05/07/2013, 05/09/2014, 05/05/2015,  05/06/2016  . Pneumococcal Conjugate-13 11/29/2013  . Pneumococcal Polysaccharide-23 04/27/2007  . Td 07/19/2013  . Zoster 03/26/2013    Qualifies for Shingles Vaccine? Yes, educated and declined  Screening Tests Health Maintenance  Topic Date Due  . TETANUS/TDAP  07/20/2023  . INFLUENZA VACCINE  Completed  . DEXA SCAN  Completed  . PNA vac Low Risk Adult  Completed    Cancer Screenings: Lung: Low Dose CT Chest recommended if Age 72-80 years, 30 pack-year currently smoking OR have quit w/in 15years. Patient does not qualify. Breast:  Up to date on Mammogram? Yes   Up to date of Bone Density/Dexa? Yes Colorectal: up to date  Additional Screenings:  Hepatitis C Screening: declined     Plan:    I have personally reviewed and addressed the Medicare Annual Wellness questionnaire and have noted the following in the patient's chart:  A. Medical and social history B. Use of alcohol, tobacco or illicit drugs  C. Current medications and supplements D. Functional ability and status E.  Nutritional status F.  Physical activity G. Advance directives H. List of other physicians I.  Hospitalizations, surgeries, and ER visits in previous 12 months J.  [place] to include hearing, vision, cognitive, depression L. Referrals and appointments - none  In addition, I have reviewed and discussed with patient certain preventive protocols, quality metrics, and best practice recommendations. A written personalized care plan for preventive services as well as general preventive health recommendations were provided to patient.  See attached scanned questionnaire for additional information.  Signed,   [name], RN Nurse Health Advisor  Patient Concerns: None

## 2018-05-04 NOTE — Patient Instructions (Signed)
Melinda Griffin , Thank you for taking time to come for your Medicare Wellness Visit. I appreciate your ongoing commitment to your health goals. Please review the following plan we discussed and let me know if I can assist you in the future.   Screening recommendations/referrals: Colonoscopy excluded, over age 79 Mammogram excluded, over age 46 Bone Density up to date Recommended yearly ophthalmology/optometry visit for glaucoma screening and checkup Recommended yearly dental visit for hygiene and checkup  Vaccinations: Influenza vaccine up to date, completed Pneumococcal vaccine up to date, completed Tdap vaccine up to date, due 07/20/2023 Shingles vaccine due, declined    Advanced directives: in chart  Conditions/risks identified: none  Next appointment: Dr. Mariea Clonts 05/07/2018 @ 1:30pm            [name], RN 05/10/2019 @ 11am   Preventive Care 65 Years and Older, Female Preventive care refers to lifestyle choices and visits with your health care provider that can promote health and wellness. What does preventive care include?  A yearly physical exam. This is also called an annual well check.  Dental exams once or twice a year.  Routine eye exams. Ask your health care provider how often you should have your eyes checked.  Personal lifestyle choices, including:  Daily care of your teeth and gums.  Regular physical activity.  Eating a healthy diet.  Avoiding tobacco and drug use.  Limiting alcohol use.  Practicing safe sex.  Taking low-dose aspirin every day.  Taking vitamin and mineral supplements as recommended by your health care provider. What happens during an annual well check? The services and screenings done by your health care provider during your annual well check will depend on your age, overall health, lifestyle risk factors, and family history of disease. Counseling  Your health care provider may ask you questions about your:  Alcohol  use.  Tobacco use.  Drug use.  Emotional well-being.  Home and relationship well-being.  Sexual activity.  Eating habits.  History of falls.  Memory and ability to understand (cognition).  Work and work Statistician.  Reproductive health. Screening  You may have the following tests or measurements:  Height, weight, and BMI.  Blood pressure.  Lipid and cholesterol levels. These may be checked every 5 years, or more frequently if you are over 33 years old.  Skin check.  Lung cancer screening. You may have this screening every year starting at age 60 if you have a 30-pack-year history of smoking and currently smoke or have quit within the past 15 years.  Fecal occult blood test (FOBT) of the stool. You may have this test every year starting at age 50.  Flexible sigmoidoscopy or colonoscopy. You may have a sigmoidoscopy every 5 years or a colonoscopy every 10 years starting at age 45.  Hepatitis C blood test.  Hepatitis B blood test.  Sexually transmitted disease (STD) testing.  Diabetes screening. This is done by checking your blood sugar (glucose) after you have not eaten for a while (fasting). You may have this done every 1-3 years.  Bone density scan. This is done to screen for osteoporosis. You may have this done starting at age 79.  Mammogram. This may be done every 1-2 years. Talk to your health care provider about how often you should have regular mammograms. Talk with your health care provider about your test results, treatment options, and if necessary, the need for more tests. Vaccines  Your health care provider may recommend certain vaccines, such as:  Influenza  vaccine. This is recommended every year.  Tetanus, diphtheria, and acellular pertussis (Tdap, Td) vaccine. You may need a Td booster every 10 years.  Zoster vaccine. You may need this after age 16.  Pneumococcal 13-valent conjugate (PCV13) vaccine. One dose is recommended after age  36.  Pneumococcal polysaccharide (PPSV23) vaccine. One dose is recommended after age 46. Talk to your health care provider about which screenings and vaccines you need and how often you need them. This information is not intended to replace advice given to you by your health care provider. Make sure you discuss any questions you have with your health care provider. Document Released: 09/08/2015 Document Revised: 05/01/2016 Document Reviewed: 06/13/2015 Elsevier Interactive Patient Education  2017 [place] Prevention in the Home Falls can cause injuries. They can happen to people of all ages. There are many things you can do to make your home safe and to help prevent falls. What can I do on the outside of my home?  Regularly fix the edges of walkways and driveways and fix any cracks.  Remove anything that might make you trip as you walk through a door, such as a raised step or threshold.  Trim any bushes or trees on the path to your home.  Use bright outdoor lighting.  Clear any walking paths of anything that might make someone trip, such as rocks or tools.  Regularly check to see if handrails are loose or broken. Make sure that both sides of any steps have handrails.  Any raised decks and porches should have guardrails on the edges.  Have any leaves, snow, or ice cleared regularly.  Use sand or salt on walking paths during winter.  Clean up any spills in your garage right away. This includes oil or grease spills. What can I do in the bathroom?  Use night lights.  Install grab bars by the toilet and in the tub and shower. Do not use towel bars as grab bars.  Use non-skid mats or decals in the tub or shower.  If you need to sit down in the shower, use a plastic, non-slip stool.  Keep the floor dry. Clean up any water that spills on the floor as soon as it happens.  Remove soap buildup in the tub or shower regularly.  Attach bath mats securely with double-sided  non-slip rug tape.  Do not have throw rugs and other things on the floor that can make you trip. What can I do in the bedroom?  Use night lights.  Make sure that you have a light by your bed that is easy to reach.  Do not use any sheets or blankets that are too big for your bed. They should not hang down onto the floor.  Have a firm chair that has side arms. You can use this for support while you get dressed.  Do not have throw rugs and other things on the floor that can make you trip. What can I do in the kitchen?  Clean up any spills right away.  Avoid walking on wet floors.  Keep items that you use a lot in easy-to-reach places.  If you need to reach something above you, use a strong step stool that has a grab bar.  Keep electrical cords out of the way.  Do not use floor polish or wax that makes floors slippery. If you must use wax, use non-skid floor wax.  Do not have throw rugs and other things on the floor that can  make you trip. What can I do with my stairs?  Do not leave any items on the stairs.  Make sure that there are handrails on both sides of the stairs and use them. Fix handrails that are broken or loose. Make sure that handrails are as long as the stairways.  Check any carpeting to make sure that it is firmly attached to the stairs. Fix any carpet that is loose or worn.  Avoid having throw rugs at the top or bottom of the stairs. If you do have throw rugs, attach them to the floor with carpet tape.  Make sure that you have a light switch at the top of the stairs and the bottom of the stairs. If you do not have them, ask someone to add them for you. What else can I do to help prevent falls?  Wear shoes that:  Do not have high heels.  Have rubber bottoms.  Are comfortable and fit you well.  Are closed at the toe. Do not wear sandals.  If you use a stepladder:  Make sure that it is fully opened. Do not climb a closed stepladder.  Make sure that both  sides of the stepladder are locked into place.  Ask someone to hold it for you, if possible.  Clearly mark and make sure that you can see:  Any grab bars or handrails.  First and last steps.  Where the edge of each step is.  Use tools that help you move around (mobility aids) if they are needed. These include:  Canes.  Walkers.  Scooters.  Crutches.  Turn on the lights when you go into a dark area. Replace any light bulbs as soon as they burn out.  Set up your furniture so you have a clear path. Avoid moving your furniture around.  If any of your floors are uneven, fix them.  If there are any pets around you, be aware of where they are.  Review your medicines with your doctor. Some medicines can make you feel dizzy. This can increase your chance of falling. Ask your doctor what other things that you can do to help prevent falls. This information is not intended to replace advice given to you by your health care provider. Make sure you discuss any questions you have with your health care provider. Document Released: 06/08/2009 Document Revised: 01/18/2016 Document Reviewed: 09/16/2014 Elsevier Interactive Patient Education  2017 [employer].

## 2018-05-05 ENCOUNTER — Encounter: Payer: Self-pay | Admitting: Internal Medicine

## 2018-05-05 ENCOUNTER — Other Ambulatory Visit: Payer: Medicare Other

## 2018-05-07 ENCOUNTER — Ambulatory Visit (INDEPENDENT_AMBULATORY_CARE_PROVIDER_SITE_OTHER): Payer: Medicare Other | Admitting: Internal Medicine

## 2018-05-07 ENCOUNTER — Encounter: Payer: Self-pay | Admitting: Internal Medicine

## 2018-05-07 VITALS — BP 118/60 | HR 80 | Temp 98.5°F | Ht 62.0 in | Wt 116.0 lb

## 2018-05-07 DIAGNOSIS — R739 Hyperglycemia, unspecified: Secondary | ICD-10-CM

## 2018-05-07 DIAGNOSIS — Z23 Encounter for immunization: Secondary | ICD-10-CM

## 2018-05-07 DIAGNOSIS — F0280 Dementia in other diseases classified elsewhere without behavioral disturbance: Secondary | ICD-10-CM

## 2018-05-07 DIAGNOSIS — G308 Other Alzheimer's disease: Secondary | ICD-10-CM | POA: Diagnosis not present

## 2018-05-07 DIAGNOSIS — J4520 Mild intermittent asthma, uncomplicated: Secondary | ICD-10-CM | POA: Diagnosis not present

## 2018-05-07 DIAGNOSIS — J301 Allergic rhinitis due to pollen: Secondary | ICD-10-CM

## 2018-05-07 DIAGNOSIS — M810 Age-related osteoporosis without current pathological fracture: Secondary | ICD-10-CM

## 2018-05-07 DIAGNOSIS — Z0000 Encounter for general adult medical examination without abnormal findings: Secondary | ICD-10-CM

## 2018-05-07 MED ORDER — ZOSTER VAC RECOMB ADJUVANTED 50 MCG/0.5ML IM SUSR: 0.5000 mL | Freq: Once | INTRAMUSCULAR | 1 refills | Status: AC

## 2018-05-07 MED ORDER — MEMANTINE HCL 10 MG PO TABS: 10.0000 mg | ORAL_TABLET | Freq: Every day | ORAL | 0 refills | Status: DC

## 2018-05-07 NOTE — Progress Notes (Signed)
Provider:  Rexene Edison. Mariea Clonts, D.O., C.M.D. Location:   [place]  Place of Service:   clinic  Previous PCP: Gayland Curry, DO Patient Care Team: Gayland Curry, DO as PCP - General (Geriatric Medicine)  Extended Emergency Contact Information Primary Emergency Contact: Liccione,[name] Address: [address]          [place], [place] [address] Johnnette Litter of [place] Phone: [phone] Mobile Phone: [phone] Relation: Daughter  Code Status: DNR Goals of Care: Advanced Directive information Advanced Directives 05/07/2018  Does Patient Have a Medical Advance Directive? Yes  Type of Paramedic of [place];Living will  Does patient want to make changes to medical advance directive? No - Patient declined  Copy of [place] in Chart? Yes  Would patient like information on creating a medical advance directive? -   Chief Complaint  Patient presents with  . Annual Exam    CPE    HPI: Patient is a 77 y.o. female seen today for an annual physical exam.  Pt had a bad case of shingles on her chest in the past.  She is now agreeable to getting the shingrix vaccine.    Some days she eats well some days very good.  Weight is down at 116 lbs from 121 in April.  She does well when her daughter cooks or they eat out and she makes good choices.  At home, she eats bread and cheese for breakfast or a peanut butter sandwich (very small serving) and a cup of yogurt, then doesn't eat for 6 hrs, then chooses a sweet.  She's eating a ton of bread, crackers.  Her daughter is encouraging pure protein.  Won't eat different ones.  Does also drink quite a bit of diet cola. Has been drinking one glass of water in the morning.  Does eat protein bars that are low carb.  She has weakness when trying to use her arms for things like pulling a coke out of the six pack.  She has limited movement with the pinky finger.    She's not wearing her braces at night as directed.     She is going through separation from her husband.  [name] is setting up her pillboxes.  May miss rare pill.  Asthma is much better lately.  Sats are 98-99%. Not coughing and hacking like she did.  Doing much better on breo. Had not been taking her meds properly before.  She actually had 3-4 mos of not taking them properly before the major hospitalization.  No falls.  [name] says she sometimes does not make good decisions like stepping over a doggy gate with three dogs in the kitchen on the other side.    23/30 vs 27/30 the prior year.  Dr. Tish Frederickson suggested namenda 10mg  qhs and then increase to bid after two weeks.  Her daughter feels confident with mom's driving.  Goes only to familiar places I the daytime and no long distances, highways or unfamiliar places and communicates first with [name], takes her phone.    Labs reviewed with them.  She has trouble finding words more in the evening.    Past Medical History:  Diagnosis Date  . Allergic rhinitis   . Anxiety   . Asthma   . Hyperlipidemia   . Migraine with aura, without mention of intractable migraine without mention of status migrainosus    Past Surgical History:  Procedure Laterality Date  . APPENDECTOMY  1975  . BREAST BIOPSY Left 1976  .  TUBAL LIGATION Bilateral 1976    reports that she quit smoking about 57 years ago. Her smoking use included cigarettes. She has never used smokeless tobacco. She reports that she does not drink alcohol or use drugs.  Functional Status Survey:  independent in ADLs, but requires help with pills, driving only short distances to familiar places in the daytime  Family History  Problem Relation Age of Onset  . Asthma Father   . Asthma Sister   . Heart disease Sister   . Cancer Mother        brain, breast  . Cancer Brother 108  . Heart disease Brother   . Hypertension Brother     Health Maintenance  Topic Date Due  . Samul Dada  07/20/2023  . INFLUENZA VACCINE  Completed  . DEXA SCAN   Completed  . PNA vac Low Risk Adult  Completed    Allergies  Allergen Reactions  . 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 Aspirin     Advised by MD previously not to take  . Ceftin     Dizzy & Diarrhea  . Ciprofloxacin Hcl     Dizzy, diarrhea    Outpatient Encounter Medications as of 05/07/2018  Medication Sig  . BIOTIN PO Take 2,000 mcg by mouth daily.  Marland Kitchen BREO ELLIPTA 200-25 MCG/INH AEPB TAKE 1 PUFF BY MOUTH EVERY DAY  . Calcium Carb-Cholecalciferol (CALCIUM 500 + D) 500-200 MG-UNIT TABS Take 1 tablet by mouth daily.  . cetirizine (ZYRTEC) 10 MG tablet Take one tablet once daily for allergies  . Cholecalciferol (VITAMIN D3) 2000 UNITS TABS Take 1 tablet by mouth daily.   . citalopram (CELEXA) 40 MG tablet TAKE 1 TABLET BY MOUTH ONCE DAILY  . fluticasone (FLONASE) 50 MCG/ACT nasal spray USE 1 SPRAY IN EACH NOSTRIL EVERY DAY AS NEEDED FOR ALLERGIES  . lovastatin (MEVACOR) 40 MG tablet TAKE 1 TABLET BY MOUTH EVERY DAY TO LOWER CHOLESTEROL  . montelukast (SINGULAIR) 10 MG tablet TAKE 1 TABLET BY MOUTH EVERY DAY  . NON FORMULARY Pure protein  . NON FORMULARY CBD 10 mg 1 in the am 1 in the pm  . [DISCONTINUED] BIOTIN PO Take 2,000 mcg by mouth daily.   No facility-administered encounter medications on file as of 05/07/2018.     Review of Systems  Constitutional: Negative for chills and fever.  HENT: Negative for congestion.        Right watery eye  Eyes: Negative for blurred vision.  Respiratory: Negative for cough, shortness of breath and wheezing.   Cardiovascular: Negative for chest pain, palpitations, leg swelling and PND.  Gastrointestinal: Negative for abdominal pain, blood in stool, constipation and melena.  Genitourinary: Negative for dysuria.  Musculoskeletal: Positive for joint pain. Negative for falls.  Neurological: Negative for dizziness and loss of consciousness.  Psychiatric/Behavioral: Positive for memory loss. Negative for depression. The patient is  nervous/anxious. The patient does not have insomnia.     Vitals:   05/07/18 1346  BP: 118/60  Pulse: 80  Temp: 98.5 F (36.9 C)  TempSrc: Oral  SpO2: 95%  Weight: 116 lb (52.6 kg)  Height: 5\' 2"  (1.575 m)   Body mass index is 21.22 kg/m. Physical Exam  Constitutional: She is oriented to person, place, and time. She appears well-developed. No distress.  HENT:  Head: Normocephalic and atraumatic.  Right Ear: External ear normal.  Left Ear: External ear normal.  Nose: Nose normal.  Mouth/Throat: Oropharynx is clear and moist. No oropharyngeal exudate.  Eyes: Pupils  are equal, round, and reactive to light. Conjunctivae and EOM are normal.  Right eye watering  Neck: Neck supple. No JVD present. No tracheal deviation present. No thyromegaly present.  Cardiovascular: Normal rate, regular rhythm, normal heart sounds and intact distal pulses.  Pulmonary/Chest: Effort normal and breath sounds normal. No respiratory distress.  Abdominal: Soft. Bowel sounds are normal. She exhibits no distension. There is no tenderness.  Musculoskeletal: Normal range of motion.  Left pinky does not fully bend, tenderness in CMC joints b/l; ambulates w/o assistive device  Lymphadenopathy:    She has no cervical adenopathy.  Neurological: She is alert and oriented to person, place, and time. No cranial nerve deficit.  Short-term memory loss  Skin: Skin is warm and dry. Capillary refill takes less than 2 seconds.  Psychiatric: She has a normal mood and affect.    Labs reviewed: Basic Metabolic Panel: Recent Labs    12/10/17 0414 12/25/17 1634 05/04/18 1109  NA 139 136 140  K 3.3* 4.5 4.5  CL 107 103 104  CO2 22 27 28   GLUCOSE 105* 105* 90  BUN 7 16 13   CREATININE 0.58 0.62 0.84  CALCIUM 8.3* 9.7 9.7  MG 1.8  --   --    Liver Function Tests: Recent Labs    12/08/17 2258 05/04/18 1109  AST 26 18  ALT 16 11  ALKPHOS 60  --   BILITOT 0.9 0.4  PROT 7.0 7.1  ALBUMIN 3.2*  --    No  results for input(s): LIPASE, AMYLASE in the last 8760 hours. No results for input(s): AMMONIA in the last 8760 hours. CBC: Recent Labs    12/08/17 2141  12/10/17 0414 12/25/17 1634 05/04/18 1109  WBC 8.9   < > 9.7 7.1 6.4  NEUTROABS 6.5  --   --  4,359 3,469  HGB 12.8   < > 10.5* 12.4 13.7  HCT 36.8   < > 31.1* 36.1 40.9  MCV 90.6   < > 92.0 90.3 92.7  PLT 257   < > 289 388 277   < > = values in this interval not displayed.   Cardiac Enzymes: No results for input(s): CKTOTAL, CKMB, CKMBINDEX, TROPONINI in the last 8760 hours. BNP: Invalid input(s): POCBNP Lab Results  Component Value Date   HGBA1C 5.7 (H) 05/04/2018   Lab Results  Component Value Date   TSH 0.77 12/08/2017  Assessment/Plan 1. Annual physical exam -performed today  2. Hyperglycemia -has been satisfactory despite her sweets eating, monitor  3. Mild intermittent chronic asthma without complication -cont breo and singulair which have helped her  4. 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  Dispense: 14 tablet; Refill: 0--paper Rx given due to indecision on patient's behalf at first  5. Seasonal allergic rhinitis due to pollen -cont zyrtec  6. Senile osteoporosis -will address medication for this at next visit, cont vitamin D3 and calcium plus weightbearing exercise, encouraged use of light weights in addition to her walking and other exercises she says she does  7. Need for shingles vaccine - Zoster Vaccine Adjuvanted [facility]) injection; Inject 0.5 mLs into the muscle once for 1 dose.  Dispense: 0.5 mL; Refill: 1--Rx sent to [facility] pharmacy and advised [name] to call first to make sure  they have the injection  09/10/2018 4 mos f/u  Labs/tests ordered:  May check labs same day   Yakir Wenke L. Ithzel Fedorchak, D.O. [facility] [address], [place] [address] Cell Phone (Mon-Fri 8am-5pm):  [phone] On Call:  [phone] & follow prompts after 5pm & weekends Office Phone:  [phone] Office Fax:  [phone]

## 2018-05-12 ENCOUNTER — Other Ambulatory Visit: Payer: Self-pay | Admitting: Internal Medicine

## 2018-05-12 DIAGNOSIS — F0280 Dementia in other diseases classified elsewhere without behavioral disturbance: Secondary | ICD-10-CM

## 2018-05-12 DIAGNOSIS — G308 Other Alzheimer's disease: Principal | ICD-10-CM

## 2018-05-12 MED ORDER — MEMANTINE HCL 10 MG PO TABS: 10.0000 mg | ORAL_TABLET | Freq: Two times a day (BID) | ORAL | 5 refills | Status: DC

## 2018-05-26 ENCOUNTER — Other Ambulatory Visit: Payer: Self-pay | Admitting: Internal Medicine

## 2018-05-26 DIAGNOSIS — J4520 Mild intermittent asthma, uncomplicated: Secondary | ICD-10-CM

## 2018-06-11 ENCOUNTER — Other Ambulatory Visit: Payer: Self-pay | Admitting: Internal Medicine

## 2018-06-11 DIAGNOSIS — G308 Other Alzheimer's disease: Principal | ICD-10-CM

## 2018-06-11 DIAGNOSIS — F0280 Dementia in other diseases classified elsewhere without behavioral disturbance: Secondary | ICD-10-CM

## 2018-06-22 ENCOUNTER — Telehealth: Payer: Self-pay | Admitting: *Deleted

## 2018-06-22 ENCOUNTER — Encounter: Payer: Self-pay | Admitting: Internal Medicine

## 2018-06-22 MED ORDER — MEMANTINE HCL ER 28 MG PO CP24: 28.0000 mg | ORAL_CAPSULE | Freq: Every day | ORAL | 0 refills | Status: DC

## 2018-06-22 NOTE — Telephone Encounter (Signed)
Melinda Griffin, daughter called and stated that she would like for you to send in Memantine ER to [facility]. Stated that they would like to try it. Please Advise.

## 2018-06-22 NOTE — Telephone Encounter (Signed)
rx sent to pharmacy by e-script Left message for daughter on VM that rx was sent to pharmacy

## 2018-07-03 ENCOUNTER — Other Ambulatory Visit: Payer: Self-pay | Admitting: Internal Medicine

## 2018-07-16 DIAGNOSIS — H5213 Myopia, bilateral: Secondary | ICD-10-CM | POA: Diagnosis not present

## 2018-07-16 DIAGNOSIS — H2513 Age-related nuclear cataract, bilateral: Secondary | ICD-10-CM | POA: Diagnosis not present

## 2018-07-16 DIAGNOSIS — H52203 Unspecified astigmatism, bilateral: Secondary | ICD-10-CM | POA: Diagnosis not present

## 2018-07-16 DIAGNOSIS — H25013 Cortical age-related cataract, bilateral: Secondary | ICD-10-CM | POA: Diagnosis not present

## 2018-07-16 DIAGNOSIS — H524 Presbyopia: Secondary | ICD-10-CM | POA: Diagnosis not present

## 2018-07-29 DIAGNOSIS — S64497D Injury of digital nerve of left little finger, subsequent encounter: Secondary | ICD-10-CM | POA: Diagnosis not present

## 2018-07-29 DIAGNOSIS — M180 Bilateral primary osteoarthritis of first carpometacarpal joints: Secondary | ICD-10-CM | POA: Diagnosis not present

## 2018-09-01 ENCOUNTER — Encounter: Payer: Self-pay | Admitting: Internal Medicine

## 2018-09-01 DIAGNOSIS — G3184 Mild cognitive impairment, so stated: Secondary | ICD-10-CM | POA: Diagnosis not present

## 2018-09-02 DIAGNOSIS — H25013 Cortical age-related cataract, bilateral: Secondary | ICD-10-CM | POA: Diagnosis not present

## 2018-09-02 DIAGNOSIS — H2513 Age-related nuclear cataract, bilateral: Secondary | ICD-10-CM | POA: Diagnosis not present

## 2018-09-09 DIAGNOSIS — H25011 Cortical age-related cataract, right eye: Secondary | ICD-10-CM | POA: Diagnosis not present

## 2018-09-09 DIAGNOSIS — H2512 Age-related nuclear cataract, left eye: Secondary | ICD-10-CM | POA: Diagnosis not present

## 2018-09-09 DIAGNOSIS — H2511 Age-related nuclear cataract, right eye: Secondary | ICD-10-CM | POA: Diagnosis not present

## 2018-09-10 ENCOUNTER — Ambulatory Visit: Payer: Medicare Other | Admitting: Internal Medicine

## 2018-09-13 ENCOUNTER — Other Ambulatory Visit: Payer: Self-pay | Admitting: Internal Medicine

## 2018-09-13 DIAGNOSIS — G308 Other Alzheimer's disease: Principal | ICD-10-CM

## 2018-09-13 DIAGNOSIS — F0280 Dementia in other diseases classified elsewhere without behavioral disturbance: Secondary | ICD-10-CM

## 2018-09-15 ENCOUNTER — Encounter (HOSPITAL_COMMUNITY): Payer: Self-pay | Admitting: Emergency Medicine

## 2018-09-15 ENCOUNTER — Ambulatory Visit (HOSPITAL_COMMUNITY)
Admission: EM | Admit: 2018-09-15 | Discharge: 2018-09-15 | Disposition: A | Discharge status: Home / Self Care | Payer: Medicare Other | Attending: Family Medicine | Admitting: Family Medicine

## 2018-09-15 DIAGNOSIS — N6310 Unspecified lump in the right breast, unspecified quadrant: Secondary | ICD-10-CM | POA: Insufficient documentation

## 2018-09-15 NOTE — ED Provider Notes (Signed)
[facility]    CSN: [id number] Arrival date & time: 09/15/18  1141     History   Chief Complaint Chief Complaint  Patient presents with  . Breast Mass    appt 1145    HPI Jacquese JASSMINE VANDRUFF is a 78 y.o. female no contributing past medical history presenting today for evaluation of right breast mass.  Patient states that approximately 2 to 3 days ago she noticed a mass in her right breast.  Since that has caused her discomfort with moving her right arm.  She has had annual mammograms, last mammogram was 09/22/2017.  She had an aspiration biopsy approximately 40 years ago in this breast.  She denies any drainage or changes to the skin.  HPI  Past Medical History:  Diagnosis Date  . Allergic rhinitis   . Anxiety   . Asthma   . Hyperlipidemia   . Migraine with aura, without mention of intractable migraine without mention of status migrainosus     Patient Active Problem List   Diagnosis Date Noted  . Community acquired pneumonia of right lower lobe of lung ([place])   . Acute metabolic encephalopathy 24/40/1027  . Weight loss, non-intentional 12/08/2017  . Relationship problem between partners 04/14/2017  . 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 Asthma, chronic 03/28/2016  . H/O migraine 05/11/2015  . Dupuytren's contracture 07/13/2014  . Encounter for therapeutic drug monitoring 12/31/2013  . Hyperlipidemia 11/29/2013  . Anxiety 11/29/2013  . Unspecified vitamin D deficiency 11/30/2012  . Depression 11/30/2012  . Mild cognitive impairment with memory loss 11/30/2012  . Hyperglycemia 11/30/2012  . Allergic rhinitis 05/09/2010    Past Surgical History:  Procedure Laterality Date  . APPENDECTOMY  1975  . BREAST BIOPSY Left 1976  . TUBAL LIGATION Bilateral 1976    OB History   No obstetric history on file.      Home Medications    Prior to Admission medications   Medication Sig Start Date End Date Taking? Authorizing Provider  BIOTIN PO Take 2,000 mcg by  mouth daily.    [provider]  Adair Patter [phone] MCG/INH AEPB INHALE 1 PUFF BY MOUTH EVERY DAY 05/26/18   Reed, Tiffany L, DO  Calcium Carb-Cholecalciferol (CALCIUM 500 + D) 500-200 MG-UNIT TABS Take 1 tablet by mouth daily.    [provider]  cetirizine (ZYRTEC) 10 MG tablet Take one tablet once daily for allergies    [provider]  Cholecalciferol (VITAMIN D3) 2000 UNITS TABS Take 1 tablet by mouth daily.     [provider]  citalopram (CELEXA) 40 MG tablet TAKE 1 TABLET BY MOUTH ONCE DAILY 05/04/18   Reed, Tiffany L, DO  fluticasone (FLONASE) 50 MCG/ACT nasal spray USE 1 SPRAY IN EACH NOSTRIL EVERY DAY AS NEEDED FOR ALLERGIES 01/18/14   Reed, Tiffany L, DO  lovastatin (MEVACOR) 40 MG tablet TAKE 1 TABLET BY MOUTH EVERY DAY TO LOWER CHOLESTEROL 07/03/18   Reed, Tiffany L, DO  memantine (NAMENDA XR) 28 MG CP24 24 hr capsule Take 1 capsule (28 mg total) by mouth daily. 06/22/18   Reed, Tiffany L, DO  montelukast (SINGULAIR) 10 MG tablet TAKE 1 TABLET BY MOUTH EVERY DAY 05/04/18   Reed, Tiffany L, DO  NON FORMULARY Pure protein    [provider]  NON FORMULARY CBD 10 mg 1 in the am 1 in the pm    [provider]    Family History Family History  Problem Relation Age of Onset  .  Asthma Father   . Asthma Sister   . Heart disease Sister   . Cancer Mother        brain, breast  . Cancer Brother 52  . Heart disease Brother   . Hypertension Brother     Social History Social History   Tobacco Use  . Smoking status: Former Smoker    Types: Cigarettes    Last attempt to quit: 02/07/1961    Years since quitting: 57.6  . Smokeless tobacco: Never Used  Substance Use Topics  . Alcohol use: No    Alcohol/week: 0.0 standard drinks  . Drug use: No     Allergies   Shrimp [shellfish allergy]; Aspirin; Ceftin; and Ciprofloxacin hcl   Review of Systems Review of Systems  Constitutional: Negative for fatigue and fever.  Eyes: Negative  for visual disturbance.  Respiratory: Negative for shortness of breath.   Cardiovascular: Negative for chest pain.  Gastrointestinal: Negative for abdominal pain, nausea and vomiting.  Musculoskeletal: Negative for arthralgias and joint swelling.  Skin: Negative for color change, rash and wound.  Neurological: Negative for dizziness, weakness, light-headedness and headaches.     Physical Exam Triage Vital Signs ED Triage Vitals [09/15/18 1209]  Enc Vitals Group     BP 126/68     Pulse Rate 95     Resp 18     Temp (!) 97.2 F (36.2 C)     Temp Source Oral     SpO2 95 %     Weight      Height      Head Circumference      Peak Flow      Pain Score 4     Pain Loc      Pain Edu?      Excl. in [place]?    No data found.  Updated Vital Signs BP 126/68 (BP Location: Right Arm)   Pulse 95   Temp (!) 97.2 F (36.2 C) (Oral)   Resp 18   SpO2 95%   Visual Acuity Right Eye Distance:   Left Eye Distance:   Bilateral Distance:    Right Eye Near:   Left Eye Near:    Bilateral Near:     Physical Exam Vitals signs and nursing note reviewed.  Constitutional:      Appearance: She is well-developed.     Comments: No acute distress  HENT:     Head: Normocephalic and atraumatic.     Nose: Nose normal.  Eyes:     Conjunctiva/sclera: Conjunctivae normal.  Neck:     Musculoskeletal: Neck supple.  Cardiovascular:     Rate and Rhythm: Normal rate.  Pulmonary:     Effort: Pulmonary effort is normal. No respiratory distress.     Comments: Palpable denser tissue superior to right nipple at 12:00, no overlying erythema, tenderness to palpation overlying right portion of right breast.  No nipple discharge expressed Abdominal:     General: There is no distension.  Musculoskeletal: Normal range of motion.  Skin:    General: Skin is warm and dry.  Neurological:     Mental Status: She is alert and oriented to person, place, and time.      UC Treatments / Results  Labs (all labs  ordered are listed, but only abnormal results are displayed) Labs Reviewed - No data to display  EKG None  Radiology No results found.  Procedures Procedures (including critical care time)  Medications Ordered in UC Medications - No data to display  Initial Impression / Assessment and Plan / UC Course  I have reviewed the triage vital signs and the nursing notes.  Pertinent labs & imaging results that were available during my care of the patient were reviewed by me and considered in my medical decision making (see chart for details).     Patient with right breast mass, will refer to breast center, patient is due for annual mammogram in 1 week anyways.  Will have patient take Tylenol, ibuprofen and warm compresses for pain relief in the interim.Discussed strict return precautions. Patient verbalized understanding and is agreeable with plan.  Final Clinical Impressions(s) / UC Diagnoses   Final diagnoses:  Breast mass, right     Discharge Instructions     [facility]  The will contact you with appointment date and time Tylenol, ibuprofen and warm compresses for pain   ED Prescriptions    None     Controlled Substance Prescriptions [place] Controlled Substance Registry consulted? Not Applicable   Janith Lima, [place] 09/15/18 1232

## 2018-09-15 NOTE — ED Triage Notes (Signed)
Pt here for right breat mass noticed 3-4 days ago

## 2018-09-15 NOTE — Discharge Instructions (Signed)
We [facility]  The will contact you with appointment date and time Tylenol, ibuprofen and warm compresses for pain

## 2018-09-21 ENCOUNTER — Other Ambulatory Visit: Payer: Self-pay | Admitting: Emergency Medicine

## 2018-09-21 DIAGNOSIS — N644 Mastodynia: Secondary | ICD-10-CM

## 2018-09-21 DIAGNOSIS — N6310 Unspecified lump in the right breast, unspecified quadrant: Secondary | ICD-10-CM

## 2018-09-29 ENCOUNTER — Ambulatory Visit
Admission: RE | Admit: 2018-09-29 | Discharge: 2018-09-29 | Disposition: A | Discharge status: Home / Self Care | Payer: Medicare Other | Source: Ambulatory Visit | Attending: Emergency Medicine | Admitting: Emergency Medicine

## 2018-09-29 DIAGNOSIS — N6310 Unspecified lump in the right breast, unspecified quadrant: Secondary | ICD-10-CM

## 2018-09-29 DIAGNOSIS — N644 Mastodynia: Secondary | ICD-10-CM

## 2018-10-02 ENCOUNTER — Other Ambulatory Visit: Payer: Self-pay | Admitting: Emergency Medicine

## 2018-10-02 DIAGNOSIS — N644 Mastodynia: Secondary | ICD-10-CM

## 2018-10-02 DIAGNOSIS — N6310 Unspecified lump in the right breast, unspecified quadrant: Secondary | ICD-10-CM

## 2018-10-05 ENCOUNTER — Other Ambulatory Visit: Payer: Self-pay | Admitting: Internal Medicine

## 2018-10-05 DIAGNOSIS — M810 Age-related osteoporosis without current pathological fracture: Secondary | ICD-10-CM

## 2018-10-06 ENCOUNTER — Other Ambulatory Visit: Payer: Self-pay | Admitting: Internal Medicine

## 2018-10-06 DIAGNOSIS — J4520 Mild intermittent asthma, uncomplicated: Secondary | ICD-10-CM

## 2018-10-07 ENCOUNTER — Ambulatory Visit
Admission: RE | Admit: 2018-10-07 | Discharge: 2018-10-07 | Disposition: A | Discharge status: Home / Self Care | Payer: Medicare Other | Source: Ambulatory Visit | Attending: Emergency Medicine | Admitting: Emergency Medicine

## 2018-10-07 DIAGNOSIS — N6310 Unspecified lump in the right breast, unspecified quadrant: Secondary | ICD-10-CM

## 2018-10-07 DIAGNOSIS — N644 Mastodynia: Secondary | ICD-10-CM

## 2018-12-01 ENCOUNTER — Other Ambulatory Visit: Payer: Self-pay | Admitting: Internal Medicine

## 2018-12-03 ENCOUNTER — Other Ambulatory Visit: Payer: Medicare Other

## 2019-02-25 ENCOUNTER — Other Ambulatory Visit: Payer: Self-pay | Admitting: Internal Medicine

## 2019-05-10 ENCOUNTER — Encounter: Payer: Medicare Other | Admitting: Family

## 2019-05-10 ENCOUNTER — Encounter: Payer: Self-pay | Admitting: Family

## 2019-05-10 ENCOUNTER — Ambulatory Visit: Payer: Self-pay

## 2019-05-11 ENCOUNTER — Ambulatory Visit: Payer: Medicare Other | Admitting: Family

## 2019-05-11 ENCOUNTER — Encounter: Payer: Self-pay | Admitting: Family

## 2019-09-10 ENCOUNTER — Other Ambulatory Visit: Payer: Self-pay | Admitting: Emergency Medicine

## 2019-09-10 DIAGNOSIS — Z1231 Encounter for screening mammogram for malignant neoplasm of breast: Secondary | ICD-10-CM

## 2019-10-06 ENCOUNTER — Other Ambulatory Visit: Payer: Self-pay | Admitting: Internal Medicine

## 2019-10-06 DIAGNOSIS — M810 Age-related osteoporosis without current pathological fracture: Secondary | ICD-10-CM

## 2019-10-20 ENCOUNTER — Ambulatory Visit: Payer: Medicare Other

## 2019-11-04 ENCOUNTER — Emergency Department (HOSPITAL_COMMUNITY)
Admission: EM | Admit: 2019-11-04 | Discharge: 2019-11-04 | Disposition: A | Discharge status: Home / Self Care | Payer: Medicare Other | Attending: Emergency Medicine | Admitting: Emergency Medicine

## 2019-11-04 ENCOUNTER — Encounter (HOSPITAL_COMMUNITY): Payer: Self-pay

## 2019-11-04 ENCOUNTER — Other Ambulatory Visit: Payer: Self-pay

## 2019-11-04 ENCOUNTER — Emergency Department (HOSPITAL_COMMUNITY): Payer: Medicare Other

## 2019-11-04 DIAGNOSIS — Z7982 Long term (current) use of aspirin: Secondary | ICD-10-CM | POA: Insufficient documentation

## 2019-11-04 DIAGNOSIS — J45909 Unspecified asthma, uncomplicated: Secondary | ICD-10-CM | POA: Diagnosis not present

## 2019-11-04 DIAGNOSIS — F0390 Unspecified dementia without behavioral disturbance: Secondary | ICD-10-CM | POA: Diagnosis not present

## 2019-11-04 DIAGNOSIS — Y92007 Garden or yard of unspecified non-institutional (private) residence as the place of occurrence of the external cause: Secondary | ICD-10-CM | POA: Insufficient documentation

## 2019-11-04 DIAGNOSIS — S80211A Abrasion, right knee, initial encounter: Secondary | ICD-10-CM

## 2019-11-04 DIAGNOSIS — S0990XA Unspecified injury of head, initial encounter: Secondary | ICD-10-CM | POA: Diagnosis present

## 2019-11-04 DIAGNOSIS — S0101XA Laceration without foreign body of scalp, initial encounter: Secondary | ICD-10-CM | POA: Diagnosis not present

## 2019-11-04 DIAGNOSIS — Z23 Encounter for immunization: Secondary | ICD-10-CM | POA: Insufficient documentation

## 2019-11-04 DIAGNOSIS — W01198A Fall on same level from slipping, tripping and stumbling with subsequent striking against other object, initial encounter: Secondary | ICD-10-CM | POA: Diagnosis not present

## 2019-11-04 DIAGNOSIS — Y9389 Activity, other specified: Secondary | ICD-10-CM | POA: Diagnosis not present

## 2019-11-04 DIAGNOSIS — Y999 Unspecified external cause status: Secondary | ICD-10-CM | POA: Diagnosis not present

## 2019-11-04 DIAGNOSIS — S0191XA Laceration without foreign body of unspecified part of head, initial encounter: Secondary | ICD-10-CM

## 2019-11-04 DIAGNOSIS — Z79899 Other long term (current) drug therapy: Secondary | ICD-10-CM | POA: Diagnosis not present

## 2019-11-04 MED ORDER — BACITRACIN ZINC 500 UNIT/GM EX OINT: TOPICAL_OINTMENT | Freq: Two times a day (BID) | CUTANEOUS | Status: DC

## 2019-11-04 MED ORDER — LIDOCAINE-EPINEPHRINE (PF) 2 %-1:200000 IJ SOLN
10.0000 mL | Freq: Once | INTRAMUSCULAR | Status: AC
  Administered 2019-11-04: 10 mL
  Filled 2019-11-04: qty 10

## 2019-11-04 MED ORDER — TETANUS-DIPHTH-ACELL PERTUSSIS 5-2.5-18.5 LF-MCG/0.5 IM SUSP
0.5000 mL | Freq: Once | INTRAMUSCULAR | Status: AC
  Administered 2019-11-04: 0.5 mL via INTRAMUSCULAR
  Filled 2019-11-04: qty 0.5

## 2019-11-04 NOTE — ED Provider Notes (Signed)
[facility] DEPT Provider Note   CSN: [id number] Arrival date & time: 11/04/19  1419     History Chief Complaint  Patient presents with  . Fall  . Head Laceration    Melinda Griffin is a 79 y.o. female history of asthma, hyperlipidemia, "mild dementia".  Patient presents today with her daughter and full-time caretaker following a fall that occurred around 1-1:30 PM.  Patient reports that she had walked outside to close the shed door, realizing that she forgot the key she turned around and "tripped" over her own feet falling to the ground.  She reports that she did hit the right side of her head on the ground as well as her right knee, she describes a mild throbbing pain of those areas constant nonradiating worsened with palpation improved with rest.  Patient denies any loss of consciousness reports that she was able to remove her cell from the ground, walk up the stairs and back into the house without assistance or difficulty.  Bleeding was not noticed until after she came inside.   Denies loss of consciousness, headache, vision changes, blood thinner use, neck pain, chest pain, abdominal pain, nausea/vomiting, numbness/tingling, weakness or any additional concerns.  Of note patient's caretaker denies any abnormal activity or confusion.  Patient baseline.  HPI     Past Medical History:  Diagnosis Date  . Allergic rhinitis   . Anxiety   . Asthma   . Hyperlipidemia   . Migraine with aura, without mention of intractable migraine without mention of status migrainosus     Patient Active Problem List   Diagnosis Date Noted  . Community acquired pneumonia of right lower lobe of lung   . Acute metabolic encephalopathy [id number]  . Weight loss, non-intentional 12/08/2017  . Relationship problem between partners 04/14/2017  . 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 Asthma, chronic 03/28/2016  . H/O migraine 05/11/2015  . Dupuytren's contracture  07/13/2014  . Encounter for therapeutic drug monitoring 12/31/2013  . Hyperlipidemia 11/29/2013  . Anxiety 11/29/2013  . Unspecified vitamin D deficiency 11/30/2012  . Depression 11/30/2012  . Mild cognitive impairment with memory loss 11/30/2012  . Hyperglycemia 11/30/2012  . Allergic rhinitis 05/09/2010    Past Surgical History:  Procedure Laterality Date  . APPENDECTOMY  1975  . BREAST BIOPSY Left 1976  . TUBAL LIGATION Bilateral 1976     OB History   No obstetric history on file.     Family History  Problem Relation Age of Onset  . Asthma Father   . Asthma Sister   . Heart disease Sister   . Cancer Mother        brain, breast  . Cancer Brother 36  . Heart disease Brother   . Hypertension Brother     Social History   Tobacco Use  . Smoking status: Former Smoker    Types: Cigarettes    Quit date: 02/07/1961    Years since quitting: 58.7  . Smokeless tobacco: Never Used  Substance Use Topics  . Alcohol use: No    Alcohol/week: 0.0 standard drinks  . Drug use: No    Home Medications Prior to Admission medications   Medication Sig Start Date End Date Taking? Authorizing Provider  BIOTIN PO Take 2,000 mcg by mouth daily.    [provider]  Adair Patter [phone] MCG/INH AEPB INHALE 1 PUFF BY MOUTH EVERY DAY 05/26/18   Reed, Tiffany L, DO  Calcium Carb-Cholecalciferol (CALCIUM 500 + D) 500-200 MG-UNIT TABS Take  1 tablet by mouth daily.    [provider]  cetirizine (ZYRTEC) 10 MG tablet Take one tablet once daily for allergies    [provider]  Cholecalciferol (VITAMIN D3) 2000 UNITS TABS Take 1 tablet by mouth daily.     [provider]  citalopram (CELEXA) 40 MG tablet TAKE 1 TABLET BY MOUTH ONCE DAILY 05/04/18   Reed, Tiffany L, DO  fluticasone (FLONASE) 50 MCG/ACT nasal spray USE 1 SPRAY IN EACH NOSTRIL EVERY DAY AS NEEDED FOR ALLERGIES 01/18/14   Reed, Tiffany L, DO  lovastatin (MEVACOR) 40 MG tablet TAKE 1 TABLET BY MOUTH  EVERY DAY TO LOWER CHOLESTEROL 07/03/18   Reed, Tiffany L, DO  memantine (NAMENDA XR) 28 MG CP24 24 hr capsule Take 1 capsule (28 mg total) by mouth daily. 06/22/18   Reed, Tiffany L, DO  montelukast (SINGULAIR) 10 MG tablet TAKE 1 TABLET BY MOUTH EVERY DAY 12/01/18   Reed, Tiffany L, DO  NON FORMULARY Pure protein    [provider]  NON FORMULARY CBD 10 mg 1 in the am 1 in the pm    [provider]    Allergies    Shrimp [shellfish allergy], Aspirin, Ceftin, and Ciprofloxacin hcl  Review of Systems   Review of Systems Ten systems are reviewed and are negative for acute change except as noted in the HPI  Physical Exam Updated Vital Signs BP 121/73 (BP Location: Left Arm)   Pulse 89   Temp 98.4 F (36.9 C) (Oral)   Resp 16   Ht 5\' 2"  (1.575 m)   Wt 54.9 kg   SpO2 96%   BMI 22.13 kg/m   Physical Exam Constitutional:      General: She is not in acute distress.    Appearance: Normal appearance. She is well-developed. She is not ill-appearing or diaphoretic.  HENT:     Head: Normocephalic. Contusion and laceration present. No raccoon eyes or Battle's sign.     Jaw: There is normal jaw occlusion. No trismus.      Comments: 2.5 cm curved laceration and mild swelling of the right parietal scalp.    Right Ear: External ear normal. No hemotympanum.     Left Ear: External ear normal. No hemotympanum.     Nose: Nose normal.     Right Nostril: No epistaxis.     Left Nostril: No epistaxis.     Mouth/Throat:     Mouth: Mucous membranes are moist.     Pharynx: Oropharynx is clear.     Comments: No evidence of dental injury Eyes:     General: Vision grossly intact. Gaze aligned appropriately.     Extraocular Movements: Extraocular movements intact.     Conjunctiva/sclera: Conjunctivae normal.     Pupils: Pupils are equal, round, and reactive to light.  Neck:     Trachea: Trachea and phonation normal. No tracheal deviation.  Pulmonary:     Effort: Pulmonary effort is  normal. No respiratory distress.  Chest:     Chest wall: No deformity, tenderness or crepitus.     Comments: No evidence of injury of the chest Abdominal:     General: There is no distension.     Palpations: Abdomen is soft.     Tenderness: There is no abdominal tenderness. There is no guarding or rebound.     Comments: No evidence of injury of the abdomen  Musculoskeletal:        General: Normal range of motion.  Cervical back: Full passive range of motion without pain, normal range of motion and neck supple.     Comments: No midline C/T/L spinal tenderness to palpation, no paraspinal muscle tenderness, no deformity, crepitus, or step-off noted. No sign of injury to the neck or back. - Approximately 1.5 cm diameter abrasion of the right knee, nonbleeding. - Full range of motion and appropriate strength without pain of all major joints of the bilateral upper and lower extremities.  Hips stable to compression without pain.  Skin:    General: Skin is warm and dry.  Neurological:     Mental Status: She is alert.     GCS: GCS eye subscore is 4. GCS verbal subscore is 5. GCS motor subscore is 6.     Comments: Speech is clear and goal oriented, follows commands Major Cranial nerves without deficit, no facial droop Normal strength in upper and lower extremities bilaterally including dorsiflexion and plantar flexion, strong and equal grip strength Sensation normal to light and sharp touch Moves extremities without ataxia, coordination intact Neg romberg, no pronator drift Normal gait  Psychiatric:        Behavior: Behavior normal.    ED Results / Procedures / Treatments   Labs (all labs ordered are listed, but only abnormal results are displayed) Labs Reviewed - No data to display  EKG None  Radiology CT Head Wo Contrast  Result Date: 11/04/2019 CLINICAL DATA:  Fall, laceration to right side of head EXAM: CT HEAD WITHOUT CONTRAST CT CERVICAL SPINE WITHOUT CONTRAST TECHNIQUE:  Multidetector CT imaging of the head and cervical spine was performed following the standard protocol without intravenous contrast. Multiplanar CT image reconstructions of the cervical spine were also generated. COMPARISON:  12/09/2017 FINDINGS: CT HEAD FINDINGS Brain: No evidence of acute infarction, hemorrhage, hydrocephalus, extra-axial collection or mass lesion/mass effect. Periventricular and deep white matter hypodensity. Vascular: No hyperdense vessel or unexpected calcification. Skull: Normal. Negative for fracture or focal lesion. Sinuses/Orbits: No acute finding. Other: Soft tissue contusion and laceration to the right parietal scalp. CT CERVICAL SPINE FINDINGS Alignment: Normal. Skull base and vertebrae: No acute fracture. No primary bone lesion or focal pathologic process. Soft tissues and spinal canal: No prevertebral fluid or swelling. No visible canal hematoma. Disc levels:  Mild multilevel disc space height loss. Upper chest: Negative. Other: None. IMPRESSION: 1. No acute intracranial pathology. Small-vessel white matter disease. 2. Soft tissue contusion and laceration to the right parietal scalp. 3.  No fracture or static subluxation of the cervical spine. Electronically Signed   By: Eddie Candle M.D.   On: 11/04/2019 16:30   CT Cervical Spine Wo Contrast  Result Date: 11/04/2019 CLINICAL DATA:  Fall, laceration to right side of head EXAM: CT HEAD WITHOUT CONTRAST CT CERVICAL SPINE WITHOUT CONTRAST TECHNIQUE: Multidetector CT imaging of the head and cervical spine was performed following the standard protocol without intravenous contrast. Multiplanar CT image reconstructions of the cervical spine were also generated. COMPARISON:  12/09/2017 FINDINGS: CT HEAD FINDINGS Brain: No evidence of acute infarction, hemorrhage, hydrocephalus, extra-axial collection or mass lesion/mass effect. Periventricular and deep white matter hypodensity. Vascular: No hyperdense vessel or unexpected calcification.  Skull: Normal. Negative for fracture or focal lesion. Sinuses/Orbits: No acute finding. Other: Soft tissue contusion and laceration to the right parietal scalp. CT CERVICAL SPINE FINDINGS Alignment: Normal. Skull base and vertebrae: No acute fracture. No primary bone lesion or focal pathologic process. Soft tissues and spinal canal: No prevertebral fluid or swelling. No visible canal hematoma. Disc  levels:  Mild multilevel disc space height loss. Upper chest: Negative. Other: None. IMPRESSION: 1. No acute intracranial pathology. Small-vessel white matter disease. 2. Soft tissue contusion and laceration to the right parietal scalp. 3.  No fracture or static subluxation of the cervical spine. Electronically Signed   By: Eddie Candle M.D.   On: 11/04/2019 16:30    Procedures .Marland KitchenLaceration Repair  Date/Time: 11/04/2019 5:56 PM Performed by: Deliah Boston, PA-C Authorized by: Deliah Boston, PA-C   Consent:    Consent obtained:  Verbal   Consent given by:  Patient, guardian and healthcare agent   Risks discussed:  Need for additional repair, infection, poor cosmetic result, pain, retained foreign body, tendon damage, vascular damage, poor wound healing and nerve damage Anesthesia (see MAR for exact dosages):    Anesthesia method:  Local infiltration   Local anesthetic:  Lidocaine 1% WITH epi Laceration details:    Location:  Scalp   Scalp location:  R parietal   Length (cm):  2.5   Depth (mm):  5 Repair type:    Repair type:  Simple Pre-procedure details:    Preparation:  Patient was prepped and draped in usual sterile fashion and imaging obtained to evaluate for foreign bodies Exploration:    Hemostasis achieved with:  Direct pressure   Wound exploration: wound explored through full range of motion and entire depth of wound probed and visualized     Wound extent: no foreign bodies/material noted, no muscle damage noted, no nerve damage noted, no tendon damage noted, no underlying  fracture noted and no vascular damage noted     Contaminated: no   Treatment:    Area cleansed with:  Betadine, saline and Shur-Clens   Amount of cleaning:  Standard   Irrigation solution:  Sterile saline   Irrigation method:  Pressure wash Skin repair:    Repair method:  Sutures   Suture size:  4-0   Suture material:  Prolene   Suture technique:  Simple interrupted   Number of sutures:  3 Approximation:    Approximation:  Close Post-procedure details:    Dressing:  Antibiotic ointment, non-adherent dressing and sterile dressing   Patient tolerance of procedure:  Tolerated well, no immediate complications Comments:     Dressing placed by nursing staff.   (including critical care time)  Medications Ordered in ED Medications  bacitracin ointment (has no administration in time range)  Tdap (BOOSTRIX) injection 0.5 mL (0.5 mLs Intramuscular Given 11/04/19 1550)  lidocaine-EPINEPHrine (XYLOCAINE W/EPI) 2 %-1:200000 (PF) injection 10 mL (10 mLs Infiltration Given 11/04/19 1551)    ED Course  I have reviewed the triage vital signs and the nursing notes.  Pertinent labs & imaging results that were available during my care of the patient were reviewed by me and considered in my medical decision making (see chart for details).    MDM Rules/Calculators/A&P                     79 year old female with history as detailed above presents today after mechanical fall at home.  Injuring the right side of her head, laceration and small hematoma present.  She denies any loss of consciousness and does not use any blood thinners.  Additionally she has a small abrasion of her right knee with no pain of that area.  She has no evidence of significant injury of the neck, chest, back, abdomen, pelvis or other extremities.  Cranial nerves intact, neurovascular tact to all 4 extremities.  CT head and cervical spine however ordered in triage.  I discussed potential of x-ray of the right knee as patient has  overlying abrasion with patient and her POA however they refused.  She has full range of motion and appropriate/equal strength of the knee without pain, no evidence of swelling or ligamentous laxity, agree it is reasonable to avoid x-ray of this area as patient has no pain.  Additionally no indication for imaging of the chest, abdomen, pelvis or other extremities at this time.  CT Head/Cspine:  IMPRESSION:  1. No acute intracranial pathology. Small-vessel white matter  disease.    2. Soft tissue contusion and laceration to the right parietal scalp.    3. No fracture or static subluxation of the cervical spine  - Tdap updated. Wound thoroughly cleaned in ED today. Wound explored and bottom of wound seen in a bloodless field. Laceration repaired as dictated above. No antibiotics indicated at this time. Patient and daughter counseled on home wound care. Follow up with PCP/urgent care or return to ER for suture removal in 7 days. Patient was urged to return to the Emergency Department for worsening pain, swelling, erythema/streaking, fever, or for any additional concerns.  At this time there does not appear to be any evidence of an acute emergency medical condition and the patient appears stable for discharge with appropriate outpatient follow up. Diagnosis was discussed with patient who verbalizes understanding of care plan and is agreeable to discharge. I have discussed return precautions with patient who verbalizes understanding of return precautions. Patient encouraged to follow-up with their PCP. All questions answered.  Patient was seen and evaluated by Dr. Sedonia Small during this visit who agrees with care and discharge.  Note: Portions of this report may have been transcribed using voice recognition software. Every effort was made to ensure accuracy; however, inadvertent computerized transcription errors may still be present. Final Clinical Impression(s) / ED Diagnoses Final diagnoses:  Injury of  head, initial encounter  Laceration of head without foreign body, unspecified part of head, initial encounter  Abrasion of right knee, initial encounter    Rx / DC Orders ED Discharge Orders    None       Gari Crown 11/04/19 1830    Maudie Flakes, MD 11/05/19 1556

## 2019-11-04 NOTE — ED Notes (Signed)
An After Visit Summary was printed and given to the patient. Discharge instructions given and no further questions at this time.  Pt leaving with daughter.  

## 2019-11-04 NOTE — Discharge Instructions (Signed)
You have been diagnosed today with fall, head injury, laceration of the head, abrasion of the right knee.  At this time there does not appear to be the presence of an emergent medical condition, however there is always the potential for conditions to change. Please read and follow the below instructions.  Please return to the Emergency Department immediately for any new or worsening symptoms. Please be sure to follow up with your Primary Care Provider within one week regarding your visit today; please call their office to schedule an appointment even if you are feeling better for a follow-up visit. Your stitches will needed to be removed in the next 7 days.  You may have them removed at your primary care doctor's office, and urgent care or here at the emergency department.  Please be sure to clean the area gently with soapy water twice daily and apply new bandages to your head laceration and to your abrasion on your right knee.  Monitor for signs of infection and return to the ER if they occur.  Get help right away if: You have very bad swelling around the wound. Your pain suddenly gets worse and is very bad. You notice painful lumps near the wound or anywhere on your body. You have a red streak going away from your wound. The wound is on your hand or foot, and: You cannot move a finger or toe. Your fingers or toes look pale or bluish. You have fever or chills You have: A very bad headache that is not helped by medicine. Trouble walking or weakness in your arms and legs. Clear or bloody fluid coming from your nose or ears. Changes in how you see (vision). Shaking movements that you cannot control. You lose your balance. You vomit. The black centers of your eyes (pupils) change in size. Your speech is slurred. Your dizziness gets worse. You pass out. You are sleepier than normal and have trouble staying awake. You have any new/concerning or worsening of symptoms  Please read the  additional information packets attached to your discharge summary.  Do not take your medicine if  develop an itchy rash, swelling in your mouth or lips, or difficulty breathing; call 911 and seek immediate emergency medical attention if this occurs.  Note: Portions of this text may have been transcribed using voice recognition software. Every effort was made to ensure accuracy; however, inadvertent computerized transcription errors may still be present.

## 2019-11-04 NOTE — ED Triage Notes (Signed)
Per EMS-Patient had an unwitnessed fall at home. Patient reports that she turned too fast to shut a door and fell. Patient has a laceration to the right side of her head. No LOC. Patient does not take blood thinners.

## 2019-12-09 ENCOUNTER — Other Ambulatory Visit: Payer: Self-pay

## 2019-12-09 ENCOUNTER — Other Ambulatory Visit: Payer: Self-pay | Admitting: Internal Medicine

## 2019-12-09 ENCOUNTER — Ambulatory Visit
Admission: RE | Admit: 2019-12-09 | Discharge: 2019-12-09 | Disposition: A | Discharge status: Home / Self Care | Payer: Medicare Other | Source: Ambulatory Visit | Attending: Emergency Medicine | Admitting: Emergency Medicine

## 2019-12-09 ENCOUNTER — Ambulatory Visit
Admission: RE | Admit: 2019-12-09 | Discharge: 2019-12-09 | Disposition: A | Discharge status: Home / Self Care | Payer: Medicare Other | Source: Ambulatory Visit | Attending: Internal Medicine | Admitting: Internal Medicine

## 2019-12-09 DIAGNOSIS — M810 Age-related osteoporosis without current pathological fracture: Secondary | ICD-10-CM

## 2019-12-09 DIAGNOSIS — Z1231 Encounter for screening mammogram for malignant neoplasm of breast: Secondary | ICD-10-CM

## 2020-05-11 ENCOUNTER — Ambulatory Visit
Admission: RE | Admit: 2020-05-11 | Discharge: 2020-05-11 | Disposition: A | Discharge status: Home / Self Care | Payer: Medicare Other | Source: Ambulatory Visit | Attending: Internal Medicine | Admitting: Internal Medicine

## 2020-05-11 ENCOUNTER — Other Ambulatory Visit: Payer: Self-pay | Admitting: Internal Medicine

## 2020-05-11 DIAGNOSIS — T17320A Food in larynx causing asphyxiation, initial encounter: Secondary | ICD-10-CM

## 2020-05-26 ENCOUNTER — Ambulatory Visit (INDEPENDENT_AMBULATORY_CARE_PROVIDER_SITE_OTHER): Payer: Medicare Other | Admitting: Emergency Medicine

## 2020-05-26 ENCOUNTER — Encounter: Payer: Self-pay | Admitting: Emergency Medicine

## 2020-05-26 ENCOUNTER — Other Ambulatory Visit: Payer: Self-pay

## 2020-05-26 ENCOUNTER — Other Ambulatory Visit (HOSPITAL_COMMUNITY): Payer: Self-pay

## 2020-05-26 VITALS — BP 120/64 | HR 72 | Temp 97.4°F | Ht 61.0 in | Wt 129.4 lb

## 2020-05-26 DIAGNOSIS — R1310 Dysphagia, unspecified: Secondary | ICD-10-CM

## 2020-05-26 NOTE — Patient Instructions (Signed)
Please stop Breo for now. Okay to keep albuterol available use 2 puffs if you need it for shortness of breath, wheezing, chest tightness Please continue your Zyrtec, Singulair, Flonase nasal spray as you have been using them Try starting omeprazole 20 mg twice a day until next visit.  Try to take 1 hour around food. We will work on arranging an SLP evaluation as well as a modified barium swallow to assess for dysphagia or aspiration Follow with Dr Lamonte Sakai in 1 month or next available to review your symptoms

## 2020-05-26 NOTE — Assessment & Plan Note (Signed)
Probably will be evaluated at least in part by neurology at [facility].  We will try to facilitate an SLP evaluation, modified barium swallow here locally.  Swallowing precautions and a modified diet probably indicated.

## 2020-05-26 NOTE — Assessment & Plan Note (Signed)
Normal spirometry in 2017 and unclear that she has lower airways disease based on description of her symptoms.  Her daughter is very good about noting that patient's mucus burden, noise, troublesome symptoms are localized to her upper airway.  Patient is not sure that she gets any benefit from [facility] and given the above I think it would be reasonable to try stopping it.  She never uses albuterol but can have it available.  We will try to control her contributing factors, especially her allergic rhinitis.  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  Finally there may be a GERD component here based on some of the patient's complaints of abdominal fullness, indigestion.  We will try to treat empirically to see if there is benefit.

## 2020-05-26 NOTE — Assessment & Plan Note (Signed)
Continue Zyrtec, Singulair, Flonase

## 2020-05-26 NOTE — Progress Notes (Signed)
Subjective:    Patient ID: Melinda Griffin, female    DOB: Mar 09, 1941, 79 y.o.   MRN: [id number]  HPI 79 year old minimal tobacco hx (2nd hand smoke exposure), with a history of hyperlipidemia, migraines, followed in our office previously for allergic rhinitis and mild intermittent asthma.  Last seen 11/07/2015. She has Alzheimer's dementia follows at [facility], has had 2 episode aspiration PNA since last visit. She has intermittent aspiration sx and cough w food or drink. [facility] at [facility] has ordered a SLP eval locally, hasn't been done.  CXR 05/12/20 after a choking event reviewed, hyperinflated but clear.  Daughter is her with her today, assists with hx.   Her asthma sx were principally cough, mucous burden, UA noise. She still has a lot of nasal gtt, clears her throat a lot. Formerly has received prednisone but not any time recently. ? Whether she may have some GERD sx based on her daughter's observations. She  She is on Breo, zyrtec, singulair, flonase. Has albuterol, never uses it.   Pulmonary function testing performed 11/07/2015 reviewed by me, shows normal airflows without a bronchodilator response, normal lung volumes and a normal DLCO.   Review of Systems As per HPI  Past Medical History:  Diagnosis Date   Allergic rhinitis    Anxiety    Asthma    Hyperlipidemia    Migraine with aura, without mention of intractable migraine without mention of status migrainosus      Family History  Problem Relation Age of Onset   Asthma Father    Asthma Sister    Heart disease Sister    Cancer Mother        brain, breast   Cancer Brother 7   Heart disease Brother    Hypertension Brother      Social History   Socioeconomic History   Marital status: Divorced    Spouse name: Charles   Number of children: 2   Years of education: college    Highest education level: Not on file  Occupational History   Not on file  Tobacco Use   Smoking status: Never  Smoker   Smokeless tobacco: Never Used   Tobacco comment: smoked  briefly in early 60's less than a year  Vaping Use   Vaping Use: Never used  Substance and Sexual Activity   Alcohol use: No    Alcohol/week: 0.0 standard drinks   Drug use: No   Sexual activity: Not on file  Other Topics Concern   Not on file  Social History Narrative   Lives with daughter currently   Drinks 1 cup of coffee a day and 1 diet soda a day    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 She previously lived in [place]. Previously has traveled to [place], [place], [place], [place], [place], [place], [place], [place], [place], [place], & [place]. She has traveled all the way up the [facility] to [place]. Hasn't traveled much in the [place] except for [place]. Prior travel to [place] & [place]. She currently has 2 dogs & 1 cat. No bird exposure. Previously worked as a Freight forwarder in [employer]. No mold or hot tub exposure. She does have a swimming pool & coy pond.    Social Determinants of Health   Financial Resource Strain:    Difficulty of Paying Living Expenses: Not on file  Food Insecurity:    Worried About [place] in the Last Year: Not on  file   [place] in the Last Year: Not on file  Transportation Needs:    Lack of Transportation (Medical): Not on file   Lack of Transportation (Non-Medical): Not on file  Physical Activity:    Days of Exercise per Week: Not on file   Minutes of Exercise per Session: Not on file  Stress:    Feeling of Stress : Not on file  Social Connections:    Frequency of Communication with Friends and Family: Not on file   Frequency of Social Gatherings with Friends and Family: Not on file   Attends Religious Services: Not on file   Active Member of Clubs or Organizations: Not on file   Attends Archivist Meetings: Not on file   Marital Status: Not on file  Intimate Partner Violence:    Fear of Current or Ex-Partner: Not on file   Emotionally Abused:  Not on file   Physically Abused: Not on file   Sexually Abused: Not on file  Has exposure to Pharmacist, hospital Has lived in [place], [place].  Travel to states all across the [place].  No mold or hot tub exposure.  She does have a swimming pool and a [place].  Allergies  Allergen Reactions   Shrimp [Shellfish Allergy] Anaphylaxis   Aspirin     Advised by MD previously not to take   Ceftin     Dizzy & Diarrhea   Ciprofloxacin Hcl     Dizzy, diarrhea     Outpatient Medications Prior to Visit  Medication Sig Dispense Refill   BREO ELLIPTA 200-25 MCG/INH AEPB INHALE 1 PUFF BY MOUTH EVERY DAY 60 each 1   Calcium Carb-Cholecalciferol (CALCIUM 500 + D) 500-200 MG-UNIT TABS Take 1 tablet by mouth daily.     cetirizine (ZYRTEC) 10 MG tablet Take one tablet once daily for allergies     Cholecalciferol (VITAMIN D3) 2000 UNITS TABS Take 1 tablet by mouth daily.      citalopram (CELEXA) 40 MG tablet TAKE 1 TABLET BY MOUTH ONCE DAILY 90 tablet 1   fluticasone (FLONASE) 50 MCG/ACT nasal spray USE 1 SPRAY IN EACH NOSTRIL EVERY DAY AS NEEDED FOR ALLERGIES 16 g 5   lovastatin (MEVACOR) 40 MG tablet TAKE 1 TABLET BY MOUTH EVERY DAY TO LOWER CHOLESTEROL 90 tablet 1   memantine (NAMENDA XR) 28 MG CP24 24 hr capsule Take 1 capsule (28 mg total) by mouth daily. 90 capsule 0   montelukast (SINGULAIR) 10 MG tablet TAKE 1 TABLET BY MOUTH EVERY DAY 90 tablet 1   BIOTIN PO Take 2,000 mcg by mouth daily.     NON FORMULARY Pure protein     NON FORMULARY CBD 10 mg 1 in the am 1 in the pm     No facility-administered medications prior to visit.         Objective:   Physical Exam  Vitals:   05/26/20 0924  BP: 120/64  Pulse: 72  Temp: (!) 97.4 F (36.3 C)  TempSrc: Temporal  SpO2: 98%  Weight: 129 lb 6.4 oz (58.7 kg)  Height: 5\' 1"  (1.549 m)   Gen: Pleasant, elderly woman, in no distress,  normal affect  ENT: No lesions,  mouth clear,  oropharynx clear, no postnasal drip,  strong voice  Neck: No JVD, no stridor  Lungs: No use of accessory muscles, no crackles or wheezing on normal respiration, no wheeze on forced expiration  Cardiovascular: RRR, heart sounds normal, no murmur or gallops,  no peripheral edema  Musculoskeletal: No deformities, no cyanosis or clubbing  Neuro: alert, awake, non focal. Able to give some hx, relies on her daughter for much of the history. No tremor noted.   Skin: Warm, no lesions or rash      Assessment & Plan:  Asthma, chronic Normal spirometry in 2017 and unclear that she has lower airways disease based on description of her symptoms.  Her daughter is very good about noting that patient's mucus burden, noise, troublesome symptoms are localized to her upper airway.  Patient is not sure that she gets any benefit from [facility] and given the above I think it would be reasonable to try stopping it.  She never uses albuterol but can have it available.  We will try to control her contributing factors, especially her allergic rhinitis.  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  Finally there may be a GERD component here based on some of the patient's complaints of abdominal fullness, indigestion.  We will try to treat empirically to see if there is benefit.  Allergic rhinitis Continue Zyrtec, Singulair, Flonase  Dysphagia Probably will be evaluated at least in part by neurology at [facility].  We will try to facilitate an SLP evaluation, modified barium swallow here locally.  Swallowing precautions and a modified diet probably indicated.  Baltazar Apo, MD, PhD 05/26/2020, 9:58 AM [facility] [phone] or if no answer [phone]

## 2020-06-02 ENCOUNTER — Ambulatory Visit (HOSPITAL_COMMUNITY)
Admission: RE | Admit: 2020-06-02 | Discharge: 2020-06-02 | Disposition: A | Discharge status: Home / Self Care | Payer: Medicare Other | Source: Ambulatory Visit | Attending: Emergency Medicine | Admitting: Emergency Medicine

## 2020-06-02 ENCOUNTER — Other Ambulatory Visit: Payer: Self-pay

## 2020-06-02 DIAGNOSIS — R1310 Dysphagia, unspecified: Secondary | ICD-10-CM | POA: Diagnosis not present

## 2020-06-02 NOTE — Progress Notes (Signed)
Modified Barium Swallow Progress Note  Patient Details  Name: Melinda Griffin MRN: [id number] Date of Birth: [birth year]  Today's Date: 06/02/2020  Modified Barium Swallow completed.  Full report located under Chart Review in the Imaging Section.  Brief recommendations include the following:  Clinical Impression  Pt demonstrates a mild oral dysphagia, mostly related to mild impulsivity associated with cognitive impairment. Pt noted to have rapid oral phase, sometimes not masticating apropriately, though she is capable of doing so. There are instances of premature spillage, particularly when self feeding at a rapid rate. Pharyngeal function is WNL. Pt does have some delayed coughing after swallowing, but without any ariway invasion. Esophageal sweep appeared WNL with barium tablet. Discussed basic precautions regarding texture and supervision with solids. Suspect pt sometimes swallows unmasticated food that may lodge in her esophagus. Daughter is aware of appropriate modifications and precautions including finely chopped solids. No SLP f/u needed at this time.    Swallow Evaluation Recommendations       SLP Diet Recommendations: Thin liquid;Dysphagia 2 (Fine chop) solids   Liquid Administration via: Cup;Straw   Medication Administration: Whole meds with liquid   Supervision: Intermittent supervision to cue for compensatory strategies   Compensations: Slow rate;Small sips/bites;Minimize environmental distractions   Postural Changes: Seated upright at 90 degrees           Herbie Baltimore, MA CCC-SLP  Acute Rehabilitation Services Pager [phone] Office [phone]  Halsey Persaud, Katherene Ponto 06/02/2020,1:34 PM

## 2020-06-14 ENCOUNTER — Encounter (HOSPITAL_COMMUNITY): Payer: Self-pay | Admitting: Emergency Medicine

## 2020-06-14 ENCOUNTER — Emergency Department (HOSPITAL_COMMUNITY): Payer: Medicare Other

## 2020-06-14 ENCOUNTER — Emergency Department (HOSPITAL_COMMUNITY)
Admission: EM | Admit: 2020-06-14 | Discharge: 2020-06-14 | Disposition: A | Discharge status: Home / Self Care | Payer: Medicare Other | Attending: Emergency Medicine | Admitting: Emergency Medicine

## 2020-06-14 ENCOUNTER — Other Ambulatory Visit: Payer: Self-pay

## 2020-06-14 DIAGNOSIS — F0390 Unspecified dementia without behavioral disturbance: Secondary | ICD-10-CM | POA: Insufficient documentation

## 2020-06-14 DIAGNOSIS — J45909 Unspecified asthma, uncomplicated: Secondary | ICD-10-CM | POA: Diagnosis not present

## 2020-06-14 DIAGNOSIS — R079 Chest pain, unspecified: Secondary | ICD-10-CM | POA: Diagnosis not present

## 2020-06-14 DIAGNOSIS — R55 Syncope and collapse: Secondary | ICD-10-CM | POA: Diagnosis present

## 2020-06-14 DIAGNOSIS — Z20822 Contact with and (suspected) exposure to covid-19: Secondary | ICD-10-CM | POA: Diagnosis not present

## 2020-06-14 LAB — COMPREHENSIVE METABOLIC PANEL
ALT: 16 U/L (ref 0–44)
AST: 19 U/L (ref 15–41)
Albumin: 3.2 g/dL — ABNORMAL LOW (ref 3.5–5.0)
Alkaline Phosphatase: 50 U/L (ref 38–126)
Anion gap: 7 (ref 5–15)
BUN: 20 mg/dL (ref 8–23)
CO2: 24 mmol/L (ref 22–32)
Calcium: 8 mg/dL — ABNORMAL LOW (ref 8.9–10.3)
Chloride: 109 mmol/L (ref 98–111)
Creatinine, Ser: 0.73 mg/dL (ref 0.44–1.00)
GFR, Estimated: >60 mL/min (ref >60)
Glucose, Bld: 140 mg/dL — ABNORMAL HIGH (ref 70–99)
Potassium: 3.6 mmol/L (ref 3.5–5.1)
Sodium: 140 mmol/L (ref 135–145)
Total Bilirubin: 0.2 mg/dL — ABNORMAL LOW (ref 0.3–1.2)
Total Protein: 6.4 g/dL — ABNORMAL LOW (ref 6.5–8.1)

## 2020-06-14 LAB — RESP PANEL BY RT PCR (RSV, FLU A&B, COVID)
Influenza A by PCR: NEGATIVE
Influenza B by PCR: NEGATIVE
Respiratory Syncytial Virus by PCR: NEGATIVE
SARS Coronavirus 2 by RT PCR: NEGATIVE

## 2020-06-14 LAB — CBC WITH DIFFERENTIAL/PLATELET
Abs Immature Granulocytes: 0.01 10*3/uL (ref 0.00–0.07)
Basophils Absolute: 0.1 10*3/uL (ref 0.0–0.1)
Basophils Relative: 1 %
Eosinophils Absolute: 0.4 10*3/uL (ref 0.0–0.5)
Eosinophils Relative: 7 %
HCT: 27.2 % — ABNORMAL LOW (ref 36.0–46.0)
Hemoglobin: 8.9 g/dL — ABNORMAL LOW (ref 12.0–15.0)
Immature Granulocytes: 0 %
Lymphocytes Relative: 33 %
Lymphs Abs: 1.9 10*3/uL (ref 0.7–4.0)
MCH: 31.9 pg (ref 26.0–34.0)
MCHC: 32.7 g/dL (ref 30.0–36.0)
MCV: 97.5 fL (ref 80.0–100.0)
Monocytes Absolute: 0.7 10*3/uL (ref 0.1–1.0)
Monocytes Relative: 12 %
Neutro Abs: 2.7 10*3/uL (ref 1.7–7.7)
Neutrophils Relative %: 47 %
Platelets: 194 10*3/uL (ref 150–400)
RBC: 2.79 MIL/uL — ABNORMAL LOW (ref 3.87–5.11)
RDW: 13.6 % (ref 11.5–15.5)
WBC: 5.7 10*3/uL (ref 4.0–10.5)
nRBC: 0 % (ref 0.0–0.2)

## 2020-06-14 LAB — CBG MONITORING, ED: Glucose-Capillary: 158 mg/dL — ABNORMAL HIGH (ref 70–99)

## 2020-06-14 MED ORDER — SODIUM CHLORIDE 0.9 % IV SOLN: INTRAVENOUS | Status: DC

## 2020-06-14 NOTE — ED Triage Notes (Signed)
Pt arrived via EMS from home. Pt had a syncopal episode while painting her fingernails. Pt had no orthostatic changes, has been drinking plenty of water. Per EMS CBG was good and EKG was clear.

## 2020-06-14 NOTE — Discharge Instructions (Addendum)
As discussed, today's evaluation has been generally reassuring.  It is important to follow-up with our cardiology colleagues.  Line when you speak with them on Monday, please inform them of today's episode of syncope, and the need for additional evaluation, monitoring, possibly including cardiac monitoring.  Return here for concerning changes in your condition.

## 2020-06-14 NOTE — ED Provider Notes (Signed)
[facility] DEPT Provider Note   CSN: [id number] Arrival date & time: 06/14/20  2109     History Chief Complaint  Patient presents with  . Loss of Consciousness    Melinda Griffin is a 79 y.o. female.  HPI  Patient presents with her daughter provides much of the history. Patient has a history of dementia, daughter notes that short-term memory has essentially nail. Today the patient had an episode of witnessed syncope.  Patient was sitting at the table, having her fingernails painted by her daughter, when she lost consciousness for several moments. Patient does not recall what occurred.  Daughter does not recall patient complaining about anything such as chest pain before, and notes that upon awakening she was back to herself fairly quickly. Patient has had 3 prior similar episodes of the past 6 months, without medical evaluation. No recent medication change, diet change, activity change. Patient's condition is, however, declining, notably over the past 18 months. Level 5 caveat secondary to dementia. The patient herself denies pain, dyspnea.  She has a delay in answering questions, but answers some simple/straightforward ones appropriately.  Past Medical History:  Diagnosis Date  . Allergic rhinitis   . Anxiety   . Asthma   . Hyperlipidemia   . Migraine with aura, without mention of intractable migraine without mention of status migrainosus     Patient Active Problem List   Diagnosis Date Noted  . Dysphagia 05/26/2020  . Community acquired pneumonia of right lower lobe of lung   . Acute metabolic encephalopathy 40/98/1191  . Weight loss, non-intentional 12/08/2017  . Relationship problem between partners 04/14/2017  . 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 Asthma, chronic 03/28/2016  . H/O migraine 05/11/2015  . Dupuytren's contracture 07/13/2014  . Encounter for therapeutic drug monitoring 12/31/2013  . Hyperlipidemia 11/29/2013    . Anxiety 11/29/2013  . Unspecified vitamin D deficiency 11/30/2012  . Depression 11/30/2012  . Mild cognitive impairment with memory loss 11/30/2012  . Hyperglycemia 11/30/2012  . Allergic rhinitis 05/09/2010    Past Surgical History:  Procedure Laterality Date  . APPENDECTOMY  1975  . BREAST BIOPSY Left 1976  . TUBAL LIGATION Bilateral 1976     OB History   No obstetric history on file.     Family History  Problem Relation Age of Onset  . Asthma Father   . Asthma Sister   . Heart disease Sister   . Cancer Mother        brain, breast  . Cancer Brother 66  . Heart disease Brother   . Hypertension Brother     Social History   Tobacco Use  . Smoking status: Never Smoker  . Smokeless tobacco: Never Used  . Tobacco comment: smoked  briefly in early 60's less than a year  Vaping Use  . Vaping Use: Never used  Substance Use Topics  . Alcohol use: No    Alcohol/week: 0.0 standard drinks  . Drug use: No    Home Medications Prior to Admission medications   Medication Sig Start Date End Date Taking? Authorizing Provider  BIOTIN PO Take 2,000 mcg by mouth daily.    [provider]  Adair Patter [phone] MCG/INH AEPB INHALE 1 PUFF BY MOUTH EVERY DAY 05/26/18   Reed, Tiffany L, DO  Calcium Carb-Cholecalciferol (CALCIUM 500 + D) 500-200 MG-UNIT TABS Take 1 tablet by mouth daily.    [provider]  cetirizine (ZYRTEC) 10 MG tablet Take one tablet once daily for allergies  [provider]  Cholecalciferol (VITAMIN D3) 2000 UNITS TABS Take 1 tablet by mouth daily.     [provider]  citalopram (CELEXA) 40 MG tablet TAKE 1 TABLET BY MOUTH ONCE DAILY 05/04/18   Reed, Tiffany L, DO  fluticasone (FLONASE) 50 MCG/ACT nasal spray USE 1 SPRAY IN EACH NOSTRIL EVERY DAY AS NEEDED FOR ALLERGIES 01/18/14   Reed, Tiffany L, DO  lovastatin (MEVACOR) 40 MG tablet TAKE 1 TABLET BY MOUTH EVERY DAY TO LOWER CHOLESTEROL 07/03/18   Reed, Tiffany L, DO   memantine (NAMENDA XR) 28 MG CP24 24 hr capsule Take 1 capsule (28 mg total) by mouth daily. 06/22/18   Reed, Tiffany L, DO  montelukast (SINGULAIR) 10 MG tablet TAKE 1 TABLET BY MOUTH EVERY DAY 12/01/18   Reed, Tiffany L, DO  NON FORMULARY Pure protein    [provider]  NON FORMULARY CBD 10 mg 1 in the am 1 in the pm    [provider]    Allergies    Shrimp [shellfish allergy], Aspirin, Ceftin, and Ciprofloxacin hcl  Review of Systems   Review of Systems  Constitutional:       Per HPI, otherwise negative  HENT:       Per HPI, otherwise negative  Respiratory:       Per HPI, otherwise negative  Cardiovascular:       Per HPI, otherwise negative  Gastrointestinal: Positive for vomiting.  Endocrine:       Negative aside from HPI  Genitourinary:       Neg aside from HPI   Musculoskeletal:       Per HPI, otherwise negative  Skin: Negative.   Neurological: Positive for syncope. Negative for weakness.    Physical Exam Updated Vital Signs BP 113/62   Pulse 73   Temp (!) 97.5 F (36.4 C) (Oral)   Resp 18   Ht 5\' 1"  (1.549 m)   Wt 59 kg   SpO2 95%   BMI 24.56 kg/m   Physical Exam Vitals and nursing note reviewed.  Constitutional:      General: She is not in acute distress.    Appearance: She is well-developed.  HENT:     Head: Normocephalic and atraumatic.  Eyes:     Conjunctiva/sclera: Conjunctivae normal.  Cardiovascular:     Rate and Rhythm: Normal rate and regular rhythm.  Pulmonary:     Effort: Pulmonary effort is normal. No respiratory distress.     Breath sounds: Normal breath sounds. No stridor.  Abdominal:     General: There is no distension.  Skin:    General: Skin is warm and dry.  Neurological:     Mental Status: She is alert and oriented to person, place, and time.     Cranial Nerves: No cranial nerve deficit.  Psychiatric:        Behavior: Behavior is slowed and withdrawn.        Cognition and Memory: Cognition is impaired.  Memory is impaired.     ED Results / Procedures / Treatments   Labs (all labs ordered are listed, but only abnormal results are displayed) Labs Reviewed  COMPREHENSIVE METABOLIC PANEL - Abnormal; Notable for the following components:      Result Value   Glucose, Bld 140 (*)    Calcium 8.0 (*)    Total Protein 6.4 (*)    Albumin 3.2 (*)    Total Bilirubin 0.2 (*)    All other components within normal limits  CBC WITH DIFFERENTIAL/PLATELET - Abnormal; Notable for the following components:   RBC 2.79 (*)    Hemoglobin 8.9 (*)    HCT 27.2 (*)    All other components within normal limits  CBG MONITORING, ED - Abnormal; Notable for the following components:   Glucose-Capillary 158 (*)    All other components within normal limits  RESP PANEL BY RT PCR (RSV, FLU A&B, COVID)    EKG EKG Interpretation  Date/Time:  Wednesday June 14 2020 21:46:20 EDT Ventricular Rate:  76 PR Interval:    QRS Duration: 106 QT Interval:  439 QTC Calculation: 494 R Axis:   21 Text Interpretation: Sinus rhythm Abnormal R-wave progression, early transition Borderline prolonged QT interval No significant change since last tracing Abnormal ECG Confirmed by Carmin Muskrat [phone]) on 06/14/2020 10:07:32 PM   Radiology DG Chest Port 1 View  Result Date: 06/14/2020 CLINICAL DATA:  Syncope EXAM: PORTABLE CHEST 1 VIEW COMPARISON:  05/11/2020 FINDINGS: Heart and mediastinal contours are within normal limits. No focal opacities or effusions. No acute bony abnormality. IMPRESSION: No active disease. Electronically Signed   By: Rolm Baptise M.D.   On: 06/14/2020 22:31    Procedures Procedures (including critical care time)  Medications Ordered in ED Medications  0.9 %  sodium chloride infusion ( Intravenous New Bag/Given 06/14/20 2152)    ED Course  I have reviewed the triage vital signs and the nursing notes.  Pertinent labs & imaging results that were available during my care of the patient were  reviewed by me and considered in my medical decision making (see chart for details).    MDM Rules/Calculators/A&P                          11:20 PM 11:20 PM Patient in no distress, sleeping.  She remains hemodynamically unremarkable. On cardiac monitor the patient has heart rate 70s, sinus, unremarkable pulse oximetry 99% room air unremarkable I had a lengthy conversation with the patient's daughter about today's episode of syncope.  We discussed her recent episodes as well. Here no evidence for sustained arrhythmia, no evidence for infection, patient when awake is interacting in a typical manner according to the daughter. Some suspicion for arrhythmogenic versus vagal episode. Patient will follow up with our cardiology colleagues for appropriate ongoing monitoring, management.    Final Clinical Impression(s) / ED Diagnoses Final diagnoses:  Syncope and collapse   MDM Number of Diagnoses or Management Options Syncope and collapse: new, needed workup   Amount and/or Complexity of Data Reviewed Clinical lab tests: reviewed Tests in the radiology section of CPT: reviewed Tests in the medicine section of CPT: reviewed Decide to obtain previous medical records or to obtain history from someone other than the patient: yes Obtain history from someone other than the patient: yes Review and summarize past medical records: yes Independent visualization of images, tracings, or specimens: yes  Risk of Complications, Morbidity, and/or Mortality Presenting problems: high Diagnostic procedures: high Management options: high  Critical Care Total time providing critical care: < 30 minutes  Patient Progress Patient progress: stable    Carmin Muskrat, MD 06/14/20 2323

## 2020-06-20 ENCOUNTER — Encounter: Payer: Self-pay | Admitting: Internal Medicine

## 2020-06-20 ENCOUNTER — Other Ambulatory Visit: Payer: Self-pay

## 2020-06-20 ENCOUNTER — Encounter: Payer: Self-pay | Admitting: *Deleted

## 2020-06-20 ENCOUNTER — Ambulatory Visit: Payer: Medicare Other | Admitting: Internal Medicine

## 2020-06-20 VITALS — BP 136/62 | HR 69 | Ht 61.0 in | Wt 130.0 lb

## 2020-06-20 DIAGNOSIS — E7800 Pure hypercholesterolemia, unspecified: Secondary | ICD-10-CM

## 2020-06-20 DIAGNOSIS — R55 Syncope and collapse: Secondary | ICD-10-CM

## 2020-06-20 NOTE — Progress Notes (Signed)
Cardiology Office Note:    Date:  06/20/2020   ID:  Melinda Griffin, Melinda Griffin 05-May-1941, MRN [id number]  PCP:  Melinda Carol, MD  Cardiologist:  No primary care provider on file.  Electrophysiologist:  None   Referring MD: Melinda Griffin,*   Chief Complaint/Reason for Referral: Syncope and epigastric pain  History of Present Illness:    Melinda Griffin is a 79 y.o. female with a history of secondhand smoke exposure, hyperlipidemia, mild intermittent asthma, and Alzheimer's dementia followed at [facility] neurology. She presents today for 3 episodes of syncope. Her daughter Melinda Griffin is present for the visit and provides most of the collaborative history.  Patient attends a day program at [employer]. She is evaluated by the nurse in that program, and generally gets a good bill of health. However she has had 3 separate episodes of witnessed syncope with no clear-cut cause at this time.  Exam tells me that on September 11 they were eating dinner and the patient was consuming many peanut butter filled pretzels. Sam feels she may have eaten too many and the patient noted an acute pain in her epigastrium. She then lost consciousness and shortly afterward had spontaneous vomiting. She was evaluated by EMS at that time and question was raised for aspiration event and vasovagal syncope. She has had a history of aspiration events in the past, which is why she lives with her daughter now.  The end of September she had another episode of orthostatic syncope. Exam feels this was related to dehydration. She has had a barium swallow since that time which was reportedly normal. She has been encouraging aggressive oral hydration which seems to have helped. In addition Prilosec was started and also seems to be helping somewhat with her symptoms of epigastric discomfort.  On approximately October 20, she had another episode of syncope after eating a very large meal. She  consumed an entire cookout meal including a full milkshake. She again described the substernal and epigastric pain followed shortly by loss of consciousness where she was slumped over in her chair. She had some shaking associated with this event as well as the first event in September, and spontaneous vomiting.  She has had issues with orthostatic hypotension responsive to oral hydration. She has no known cardiovascular history per [name]'s report.  Today she denies chest pain, shortness of breath, palpitations, PND, orthopnea, leg swelling. Denies current dizziness or lightheadedness. Ambulates easily in the office without dyspnea.  Past Medical History:  Diagnosis Date  . Allergic rhinitis   . Anxiety   . Asthma   . Hyperlipidemia   . Migraine with aura, without mention of intractable migraine without mention of status migrainosus     Past Surgical History:  Procedure Laterality Date  . APPENDECTOMY  1975  . BREAST BIOPSY Left 1976  . TUBAL LIGATION Bilateral 1976    Current Medications: Current Meds  Medication Sig  . Calcium Carb-Cholecalciferol (CALCIUM 500 + D) 500-200 MG-UNIT TABS Take 1 tablet by mouth daily.  . cetirizine (ZYRTEC) 10 MG tablet Take one tablet once daily for allergies  . Cholecalciferol (VITAMIN D3) 2000 UNITS TABS Take 1 tablet by mouth daily.   . citalopram (CELEXA) 40 MG tablet TAKE 1 TABLET BY MOUTH ONCE DAILY  . lovastatin (MEVACOR) 40 MG tablet TAKE 1 TABLET BY MOUTH EVERY DAY TO LOWER CHOLESTEROL  . montelukast (SINGULAIR) 10 MG tablet TAKE 1 TABLET BY MOUTH EVERY DAY  . omeprazole (PRILOSEC) 10 MG capsule Take 10 mg  by mouth in the morning and at bedtime.     Allergies:   Shrimp [shellfish allergy], Aspirin, Ceftin, and Ciprofloxacin hcl   Social History   Tobacco Use  . Smoking status: Never Smoker  . Smokeless tobacco: Never Used  . Tobacco comment: smoked  briefly in early 60's less than a year  Vaping Use  . Vaping Use: Never used    Substance Use Topics  . Alcohol use: No    Alcohol/week: 0.0 standard drinks  . Drug use: No     Family History: The patient's family history includes Asthma in her father and sister; Cancer in her mother; Cancer (age of onset: 46) in her brother; Heart disease in her brother and sister; Hypertension in her brother.  ROS:   Please see the history of present illness.    All other systems reviewed and are negative.  EKGs/Labs/Other Studies Reviewed:    The following studies were reviewed today:  EKG: Normal sinus rhythm, rate 69  Recent Labs: 06/14/2020: ALT 16; BUN 20; Creatinine, Ser 0.73; Hemoglobin 8.9; Platelets 194; Potassium 3.6; Sodium 140  Recent Lipid Panel    Component Value Date/Time   CHOL 178 05/04/2018 1109   CHOL 198 10/18/2015 1036   TRIG 138 05/04/2018 1109   HDL 62 05/04/2018 1109   HDL 56 10/18/2015 1036   CHOLHDL 2.9 05/04/2018 1109   VLDL 35 (H) 04/11/2017 0939   LDLCALC 92 05/04/2018 1109    Physical Exam:    VS:  BP 136/62 (BP Location: Right Arm, Patient Position: Sitting, Cuff Size: Normal)   Pulse 69   Ht 5\' 1"  (1.549 m)   Wt 130 lb (59 kg)   BMI 24.56 kg/m     Wt Readings from Last 5 Encounters:  06/20/20 130 lb (59 kg)  06/14/20 130 lb (59 kg)  05/26/20 129 lb 6.4 oz (58.7 kg)  11/04/19 121 lb (54.9 kg)  05/07/18 116 lb (52.6 kg)    Constitutional: No acute distress Eyes: sclera non-icteric, normal conjunctiva and lids ENMT: Mask in place Cardiovascular: regular rhythm, normal rate, no murmurs. S1 and S2 normal. Radial pulses normal bilaterally. No jugular venous distention.  Respiratory: clear to auscultation bilaterally GI : normal bowel sounds, soft and nontender. No distention.   MSK: extremities warm, well perfused. No edema.  NEURO: grossly nonfocal exam, moves all extremities. PSYCH: Alert, oriented to person. Does not easily follow the conversation but participates when prompted.  ASSESSMENT:    1. Syncope and collapse    2. Pure hypercholesterolemia    PLAN:    Syncope and collapse - Plan: EKG 12-Lead, ECHOCARDIOGRAM COMPLETE, CARDIAC EVENT MONITOR, VAS US CAROTID  Pure hypercholesterolemia   She has unexplained syncopal episodes, which seem to follow large meals on 2 occasions. I suspect her syncope is neurocardiogenic in origin and may be prompted by esophageal spasm, very large meal, or vagal response to reflux. She has not however had a cardiovascular evaluation for syncope. Therefore we will perform an echocardiogram, carotid Dopplers, and 30-day event monitor to exclude arrhythmia or structural cause of syncope.  We discussed that there is also a possibility of left main or left main equivalent disease as the source of her epigastric discomfort after a large meal. I have offered stress testing. They would like to complete the initial evaluation first and if indicated, we can consider stress testing to follow.  Lipids are mildly abnormal on last check in February, will complete testing and determine if recheck lipids is  required. She is on lovastatin 40 mg a day. Can continue this at this time.  Would recommend compression stockings or socks for orthostatic symptoms. This can be particularly helpful after large meals to avoid possible positional/orthostatic hypotension. They will try this and report back at follow-up.  Total time of encounter: 45 minutes total time of encounter, including 40 minutes spent in face-to-face patient care on the date of this encounter. This time includes coordination of care and counseling regarding above mentioned problem list. Remainder of non-face-to-face time involved reviewing chart documents/testing relevant to the patient encounter and documentation in the medical record. I have independently reviewed documentation from referring provider.   Cherlynn Kaiser, MD [place]  [facility]    Medication Adjustments/Labs and Tests Ordered: Current medicines are  reviewed at length with the patient today.  Concerns regarding medicines are outlined above.   Orders Placed This Encounter  Procedures  . CARDIAC EVENT MONITOR  . EKG 12-Lead  . ECHOCARDIOGRAM COMPLETE  . VAS US CAROTID    No orders of the defined types were placed in this encounter.   Patient Instructions  Medication Instructions:  No Changes In Medications at this time.  *If you need a refill on your cardiac medications before your next appointment, please call your pharmacy*  Lab Work: None Ordered At This Time.  If you have labs (blood work) drawn today and your tests are completely normal, you will receive your results only by: Marland Kitchen MyChart Message (if you have MyChart) OR . A paper copy in the mail If you have any lab test that is abnormal or we need to change your treatment, we will call you to review the results.  Testing/Procedures: Your physician has requested that you have an echocardiogram. Echocardiography is a painless test that uses sound waves to create images of your heart. It provides your doctor with information about the size and shape of your heart and how well your heart's chambers and valves are working. You may receive an ultrasound enhancing agent through an IV if needed to better visualize your heart during the echo.This procedure takes approximately one hour. There are no restrictions for this procedure. This will take place at the [address].   Your physician has requested that you have a carotid duplex. This test is an ultrasound of the carotid arteries in your neck. It looks at blood flow through these arteries that supply the brain with blood. Allow one hour for this exam. There are no restrictions or special instructions.  Your physician has recommended that you wear an event monitor. Event monitors are medical devices that record the heart's electrical activity. Doctors most often [place] these monitors to diagnose arrhythmias. Arrhythmias are  problems with the speed or rhythm of the heartbeat. The monitor is a small, portable device. You can wear one while you do your normal daily activities. This is usually used to diagnose what is causing palpitations/syncope (passing out).  Follow-Up: At [facility], you and your health needs are our priority.  As part of our continuing mission to provide you with exceptional heart care, we have created designated Provider Care Teams.  These Care Teams include your primary Cardiologist (physician) and Advanced Practice Providers (APPs -  Physician Assistants and Nurse Practitioners) who all work together to provide you with the care you need, when you need it.  Your next appointment:   2 month(s)  The format for your next appointment:   In Person  Provider:  Cherlynn Kaiser, MD  Other Instructions  DR Margaretann Loveless RECOMMENDS [place] Cardiac Event Monitor Instructions Your physician has requested you wear your cardiac event monitor for  30 days, (1-30). [employer] may call or text to confirm a shipping address. The monitor will be sent to a land address via UPS. [employer] will not ship a monitor to a PO BOX. It typically takes 3-5 days to receive your monitor after it has been enrolled. [employer] will assist with USPS tracking if your package is delayed. The telephone number for [employer] is [phone]. Once you have received your monitor, please review the enclosed instructions. Instruction tutorials can also be viewed under help and settings on the enclosed cell phone. Your monitor has already been registered assigning a specific monitor serial # to you.  Applying the monitor Remove cell phone from case and turn it on. The cell phone works as Dealer and needs to be within [employer] of you at all times. The cell phone will need to be charged on a daily basis. We recommend you plug the cell phone into the enclosed charger at your bedside  table every night.  Monitor batteries: You will receive two monitor batteries labelled #1 and #2. These are your recorders. Plug battery #2 onto the second connection on the enclosed charger. Keep one battery on the charger at all times. This will keep the monitor battery deactivated. It will also keep it fully charged for when you need to switch your monitor batteries. A small light will be blinking on the battery emblem when it is charging. The light on the battery emblem will remain on when the battery is fully charged.  Open package of a Monitor strip. Insert battery #1 into black hood on strip and gently squeeze monitor battery onto connection as indicated in instruction booklet. Set aside while preparing skin.  Choose location for your strip, vertical or horizontal, as indicated in the instruction booklet. Shave to remove all hair from location. There cannot be any lotions, oils, powders, or colognes on skin where monitor is to be applied. Wipe skin clean with enclosed Saline wipe. Dry skin completely.  Peel paper labeled #1 off the back of the Monitor strip exposing the adhesive. Place the monitor on the chest in the vertical or horizontal position shown in the instruction booklet. One arrow on the monitor strip must be pointing upward. Carefully remove paper labeled #2, attaching remainder of strip to your skin. Try not to create any folds or wrinkles in the strip as you apply it.  Firmly press and release the circle in the center of the monitor battery. You will hear a small beep. This is turning the monitor battery on. The heart emblem on the monitor battery will light up every 5 seconds if the monitor battery in turned on and connected to the patient securely. Do not push and hold the circle down as this turns the monitor battery off. The cell phone will locate the monitor battery. A screen will appear on the cell phone checking the connection of your monitor strip. This may read  poor connection initially but change to good connection within the next minute. Once your monitor accepts the connection you will hear a series of 3 beeps followed by a climbing crescendo of beeps. A screen will appear on the cell phone showing the two monitor strip placement options. Touch the picture that demonstrates where you applied the monitor strip.  Your monitor strip and battery  are waterproof. You are able to shower, bathe, or swim with the monitor on. They just ask you do not submerge deeper than 3 feet underwater. We recommend removing the monitor if you are swimming in a lake, river, or ocean.  Your monitor battery will need to be switched to a fully charged monitor battery approximately once a week. The cell phone will alert you of an action which needs to be made.  On the cell phone, tap for details to reveal connection status, monitor battery status, and cell phone battery status. The green dots indicates your monitor is in good status. A red dot indicates there is something that needs your attention.  To record a symptom, click the circle on the monitor battery. In 30-60 seconds a list of symptoms will appear on the cell phone. Select your symptom and tap save. Your monitor will record a sustained or significant arrhythmia regardless of you clicking the button. Some patients do not feel the heart rhythm irregularities. [employer] will notify us of any serious or critical events.  Refer to instruction booklet for instructions on switching batteries, changing strips, the Do not disturb or Pause features, or any additional questions.  Call [employer] at [phone], to confirm your monitor is transmitting and record your baseline. They will answer any questions you may have regarding the monitor instructions at that time.  Returning the monitor to [place] all equipment back into blue box. Peel off strip of paper to expose adhesive and close box securely.  There is a prepaid UPS shipping label on this box. Drop in a UPS drop box, or at a UPS facility like [employer]. You may also contact [employer] to arrange UPS to pick up monitor package at your home.

## 2020-06-20 NOTE — Patient Instructions (Addendum)
Medication Instructions:  No Changes In Medications at this time.  *If you need a refill on your cardiac medications before your next appointment, please call your pharmacy*  Lab Work: None Ordered At This Time.  If you have labs (blood work) drawn today and your tests are completely normal, you will receive your results only by: Marland Kitchen MyChart Message (if you have MyChart) OR . A paper copy in the mail If you have any lab test that is abnormal or we need to change your treatment, we will call you to review the results.  Testing/Procedures: Your physician has requested that you have an echocardiogram. Echocardiography is a painless test that uses sound waves to create images of your heart. It provides your doctor with information about the size and shape of your heart and how well your heart's chambers and valves are working. You may receive an ultrasound enhancing agent through an IV if needed to better visualize your heart during the echo.This procedure takes approximately one hour. There are no restrictions for this procedure. This will take place at the [address].   Your physician has requested that you have a carotid duplex. This test is an ultrasound of the carotid arteries in your neck. It looks at blood flow through these arteries that supply the brain with blood. Allow one hour for this exam. There are no restrictions or special instructions.  Your physician has recommended that you wear an event monitor. Event monitors are medical devices that record the heart's electrical activity. Doctors most often [place] these monitors to diagnose arrhythmias. Arrhythmias are problems with the speed or rhythm of the heartbeat. The monitor is a small, portable device. You can wear one while you do your normal daily activities. This is usually used to diagnose what is causing palpitations/syncope (passing out).  Follow-Up: At [name], you and your health needs are our priority.  As  part of our continuing mission to provide you with exceptional heart care, we have created designated Provider Care Teams.  These Care Teams include your primary Cardiologist (physician) and Advanced Practice Providers (APPs -  Physician Assistants and Nurse Practitioners) who all work together to provide you with the care you need, when you need it.  Your next appointment:   2 month(s)  The format for your next appointment:   In Person  Provider:   Cherlynn Kaiser, MD  Other Instructions  DR Margaretann Loveless RECOMMENDS [place] Cardiac Event Monitor Instructions Your physician has requested you wear your cardiac event monitor for  30 days, (1-30). [employer] may call or text to confirm a shipping address. The monitor will be sent to a land address via UPS. [employer] will not ship a monitor to a PO BOX. It typically takes 3-5 days to receive your monitor after it has been enrolled. [employer] will assist with USPS tracking if your package is delayed. The telephone number for [employer] is [phone]. Once you have received your monitor, please review the enclosed instructions. Instruction tutorials can also be viewed under help and settings on the enclosed cell phone. Your monitor has already been registered assigning a specific monitor serial # to you.  Applying the monitor Remove cell phone from case and turn it on. The cell phone works as Dealer and needs to be within [employer] of you at all times. The cell phone will need to be charged on a daily basis. We recommend you plug the cell phone into  the enclosed charger at your bedside table every night.  Monitor batteries: You will receive two monitor batteries labelled #1 and #2. These are your recorders. Plug battery #2 onto the second connection on the enclosed charger. Keep one battery on the charger at all times. This will keep the monitor battery deactivated. It will also keep it fully  charged for when you need to switch your monitor batteries. A small light will be blinking on the battery emblem when it is charging. The light on the battery emblem will remain on when the battery is fully charged.  Open package of a Monitor strip. Insert battery #1 into black hood on strip and gently squeeze monitor battery onto connection as indicated in instruction booklet. Set aside while preparing skin.  Choose location for your strip, vertical or horizontal, as indicated in the instruction booklet. Shave to remove all hair from location. There cannot be any lotions, oils, powders, or colognes on skin where monitor is to be applied. Wipe skin clean with enclosed Saline wipe. Dry skin completely.  Peel paper labeled #1 off the back of the Monitor strip exposing the adhesive. Place the monitor on the chest in the vertical or horizontal position shown in the instruction booklet. One arrow on the monitor strip must be pointing upward. Carefully remove paper labeled #2, attaching remainder of strip to your skin. Try not to create any folds or wrinkles in the strip as you apply it.  Firmly press and release the circle in the center of the monitor battery. You will hear a small beep. This is turning the monitor battery on. The heart emblem on the monitor battery will light up every 5 seconds if the monitor battery in turned on and connected to the patient securely. Do not push and hold the circle down as this turns the monitor battery off. The cell phone will locate the monitor battery. A screen will appear on the cell phone checking the connection of your monitor strip. This may read poor connection initially but change to good connection within the next minute. Once your monitor accepts the connection you will hear a series of 3 beeps followed by a climbing crescendo of beeps. A screen will appear on the cell phone showing the two monitor strip placement options. Touch the picture that  demonstrates where you applied the monitor strip.  Your monitor strip and battery are waterproof. You are able to shower, bathe, or swim with the monitor on. They just ask you do not submerge deeper than 3 feet underwater. We recommend removing the monitor if you are swimming in a lake, river, or ocean.  Your monitor battery will need to be switched to a fully charged monitor battery approximately once a week. The cell phone will alert you of an action which needs to be made.  On the cell phone, tap for details to reveal connection status, monitor battery status, and cell phone battery status. The green dots indicates your monitor is in good status. A red dot indicates there is something that needs your attention.  To record a symptom, click the circle on the monitor battery. In 30-60 seconds a list of symptoms will appear on the cell phone. Select your symptom and tap save. Your monitor will record a sustained or significant arrhythmia regardless of you clicking the button. Some patients do not feel the heart rhythm irregularities. [employer] will notify us of any serious or critical events.  Refer to instruction booklet for instructions on switching batteries, changing  strips, the Do not disturb or Pause features, or any additional questions.  Call [employer] at [phone], to confirm your monitor is transmitting and record your baseline. They will answer any questions you may have regarding the monitor instructions at that time.  Returning the monitor to [place] all equipment back into blue box. Peel off strip of paper to expose adhesive and close box securely. There is a prepaid UPS shipping label on this box. Drop in a UPS drop box, or at a UPS facility like [employer]. You may also contact [employer] to arrange UPS to pick up monitor package at your home.

## 2020-06-20 NOTE — Progress Notes (Signed)
Patient ID: Melinda Griffin, female   DOB: 1941-08-23, 79 y.o.   MRN: [id number] Patient enrolled for [employer] to ship a 30 day cardiac event monitor to her home.

## 2020-06-27 ENCOUNTER — Other Ambulatory Visit: Payer: Self-pay

## 2020-06-27 ENCOUNTER — Encounter: Payer: Self-pay | Admitting: Emergency Medicine

## 2020-06-27 ENCOUNTER — Ambulatory Visit: Payer: Medicare Other | Admitting: Emergency Medicine

## 2020-06-27 DIAGNOSIS — G478 Other sleep disorders: Secondary | ICD-10-CM | POA: Diagnosis not present

## 2020-06-27 NOTE — Assessment & Plan Note (Signed)
Voice quality is improved, dyspnea and cough are improved.  She benefited from the omeprazole.  She also benefited from the discontinuation of Breo.  I do not think she has true asthma.  We will try stopping the Singulair to see if she tolerates.  Try stopping Singulair We will stay off Breo and fluticasone nasal spray Okay to continue Zyrtec as you have been taking it Continue omeprazole twice a day as you have been taking it. Continue to practice your swallowing precautions Follow Dr. Lamonte Sakai if you have any changes in your breathing or coughing

## 2020-06-27 NOTE — Progress Notes (Signed)
Subjective:    Patient ID: Melinda Griffin, female    DOB: 1940/11/07, 79 y.o.   MRN: [id number]  HPI 79 year old minimal tobacco hx (2nd hand smoke exposure), with a history of hyperlipidemia, migraines, followed in our office previously for allergic rhinitis and mild intermittent asthma.  Last seen 11/07/2015. She has Alzheimer's dementia follows at [facility], has had 2 episode aspiration PNA since last visit. She has intermittent aspiration sx and cough w food or drink. [facility] at [facility] has ordered a SLP eval locally, hasn't been done.  CXR 05/12/20 after a choking event reviewed, hyperinflated but clear.  Daughter is her with her today, assists with hx.   Her asthma sx were principally cough, mucous burden, UA noise. She still has a lot of nasal gtt, clears her throat a lot. Formerly has received prednisone but not any time recently. ? Whether she may have some GERD sx based on her daughter's observations. She  She is on Breo, zyrtec, singulair, flonase. Has albuterol, never uses it.   Pulmonary function testing performed 11/07/2015 reviewed by me, shows normal airflows without a bronchodilator response, normal lung volumes and a normal DLCO.  ROV 06/27/20 --this is a follow-up visit for 79 year old woman with a history of dysphagia and chronic aspiration in the setting of Alzheimer's dementia.  Also carries a history of possible asthma although reassuring spirometry in 2017.  At her last visit we stopped her Memory Dance given the concern that it could be exacerbating upper airway irritation.  We continued Zyrtec, Singulair, Flonase and started omeprazole.  She underwent modified barium swallow 06/02/2020 that showed mild oral dysphagia.  Recommend thin liquids, dysphagia 2 and swallowing precautions. She seems to have benefited from the omeprazole - less burning, less UA irritation.  She experienced another syncopal episode 10/20, has a cardiology eval planned.  She has tried stopping her nasal  steroid, has not seemed to miss it.    Review of Systems As per HPI      Objective:   Physical Exam  Vitals:   06/27/20 1657  BP: 118/60  Pulse: 68  SpO2: 95%  Weight: 131 lb 12.8 oz (59.8 kg)  Height: 5\' 1"  (1.549 m)   Gen: Pleasant, elderly woman, in no distress,  normal affect  ENT: No lesions,  mouth clear,  oropharynx clear, no postnasal drip, strong voice  Neck: No JVD, no stridor  Lungs: No use of accessory muscles, no crackles or wheezing on normal respiration, no wheeze on forced expiration  Cardiovascular: RRR, heart sounds normal, no murmur or gallops, no peripheral edema  Musculoskeletal: No deformities, no cyanosis or clubbing  Neuro: alert, awake, non focal. Able to give some hx, relies on her daughter for much of the history. No tremor noted.   Skin: Warm, no lesions or rash      Assessment & Plan:  Upper airway resistance syndrome Voice quality is improved, dyspnea and cough are improved.  She benefited from the omeprazole.  She also benefited from the discontinuation of Breo.  I do not think she has true asthma.  We will try stopping the Singulair to see if she tolerates.  Try stopping Singulair We will stay off Breo and fluticasone nasal spray Okay to continue Zyrtec as you have been taking it Continue omeprazole twice a day as you have been taking it. Continue to practice your swallowing precautions Follow Dr. Lamonte Sakai if you have any changes in your breathing or coughing  Baltazar Apo, MD, PhD 06/27/2020, 5:26 PM  [facility] [phone] or if no answer [phone]

## 2020-06-27 NOTE — Patient Instructions (Addendum)
Try stopping Singulair We will stay off Breo and fluticasone nasal spray Okay to continue Zyrtec as you have been taking it Continue omeprazole twice a day as you have been taking it. Continue to practice your swallowing precautions Follow Dr. Lamonte Sakai if you have any changes in your breathing or coughing.

## 2020-06-28 ENCOUNTER — Encounter (INDEPENDENT_AMBULATORY_CARE_PROVIDER_SITE_OTHER): Payer: Medicare Other

## 2020-06-28 DIAGNOSIS — R55 Syncope and collapse: Secondary | ICD-10-CM | POA: Diagnosis not present

## 2020-07-04 ENCOUNTER — Emergency Department (HOSPITAL_COMMUNITY)
Admission: EM | Admit: 2020-07-04 | Discharge: 2020-07-05 | Disposition: A | Discharge status: Home / Self Care | Payer: Medicare Other | Attending: Emergency Medicine | Admitting: Emergency Medicine

## 2020-07-04 ENCOUNTER — Emergency Department (HOSPITAL_COMMUNITY): Payer: Medicare Other

## 2020-07-04 DIAGNOSIS — Z79899 Other long term (current) drug therapy: Secondary | ICD-10-CM | POA: Diagnosis not present

## 2020-07-04 DIAGNOSIS — R55 Syncope and collapse: Secondary | ICD-10-CM | POA: Diagnosis present

## 2020-07-04 DIAGNOSIS — R1310 Dysphagia, unspecified: Secondary | ICD-10-CM

## 2020-07-04 DIAGNOSIS — J45909 Unspecified asthma, uncomplicated: Secondary | ICD-10-CM | POA: Insufficient documentation

## 2020-07-04 LAB — COMPREHENSIVE METABOLIC PANEL
ALT: 19 U/L (ref 0–44)
AST: 30 U/L (ref 15–41)
Albumin: 3.4 g/dL — ABNORMAL LOW (ref 3.5–5.0)
Alkaline Phosphatase: 54 U/L (ref 38–126)
Anion gap: 7 (ref 5–15)
BUN: 17 mg/dL (ref 8–23)
CO2: 25 mmol/L (ref 22–32)
Calcium: 8.9 mg/dL (ref 8.9–10.3)
Chloride: 104 mmol/L (ref 98–111)
Creatinine, Ser: 0.74 mg/dL (ref 0.44–1.00)
GFR, Estimated: >60 mL/min (ref >60)
Glucose, Bld: 126 mg/dL — ABNORMAL HIGH (ref 70–99)
Potassium: 4.7 mmol/L (ref 3.5–5.1)
Sodium: 136 mmol/L (ref 135–145)
Total Bilirubin: 0.6 mg/dL (ref 0.3–1.2)
Total Protein: 6.6 g/dL (ref 6.5–8.1)

## 2020-07-04 LAB — CBC WITH DIFFERENTIAL/PLATELET
Abs Immature Granulocytes: 0.02 10*3/uL (ref 0.00–0.07)
Basophils Absolute: 0.1 10*3/uL (ref 0.0–0.1)
Basophils Relative: 1 %
Eosinophils Absolute: 0.7 10*3/uL — ABNORMAL HIGH (ref 0.0–0.5)
Eosinophils Relative: 9 %
HCT: 37.5 % (ref 36.0–46.0)
Hemoglobin: 12.3 g/dL (ref 12.0–15.0)
Immature Granulocytes: 0 %
Lymphocytes Relative: 30 %
Lymphs Abs: 2.3 10*3/uL (ref 0.7–4.0)
MCH: 30.7 pg (ref 26.0–34.0)
MCHC: 32.8 g/dL (ref 30.0–36.0)
MCV: 93.5 fL (ref 80.0–100.0)
Monocytes Absolute: 0.9 10*3/uL (ref 0.1–1.0)
Monocytes Relative: 11 %
Neutro Abs: 3.7 10*3/uL (ref 1.7–7.7)
Neutrophils Relative %: 49 %
Platelets: 203 10*3/uL (ref 150–400)
RBC: 4.01 MIL/uL (ref 3.87–5.11)
RDW: 13.2 % (ref 11.5–15.5)
WBC: 7.6 10*3/uL (ref 4.0–10.5)
nRBC: 0 % (ref 0.0–0.2)

## 2020-07-04 MED ORDER — FAMOTIDINE IN NACL 20-0.9 MG/50ML-% IV SOLN
20.0000 mg | Freq: Once | INTRAVENOUS | Status: AC
  Administered 2020-07-04: 20 mg via INTRAVENOUS
  Filled 2020-07-04: qty 50

## 2020-07-04 MED ORDER — LIDOCAINE VISCOUS HCL 2 % MT SOLN
15.0000 mL | Freq: Once | OROMUCOSAL | Status: AC
  Administered 2020-07-04: 15 mL via ORAL
  Filled 2020-07-04: qty 15

## 2020-07-04 MED ORDER — ALUM & MAG HYDROXIDE-SIMETH 200-200-20 MG/5ML PO SUSP
30.0000 mL | Freq: Once | ORAL | Status: AC
  Administered 2020-07-04: 30 mL via ORAL
  Filled 2020-07-04: qty 30

## 2020-07-04 NOTE — ED Provider Notes (Signed)
[facility] EMERGENCY DEPARTMENT Provider Note   CSN: [id number] Arrival date & time: 07/04/20  2138     History Chief Complaint  Patient presents with  . Loss of Consciousness    Melinda Griffin is a 79 y.o. female.  Patient with history of Alzheimers, reflux, previous syncopal episodes, presents with syncopal episode that occurred just prior to arrival by EMS. She lives with her daughter, [name], who provides full time care and who reports she was sitting across from the patient tonight doing her nails when the patient complained of pain in her epigastrium. [name] was walking her to her room to lie down when she syncopized, eased to the floor and lasted about 1 1/2 minutes. No vomiting, seizure activity or post-ictal symptoms. [name] reports that her passing out episodes frequently occurs after an attack of epigastric pain, usually after eating a large meal which daughter has decreased recently, and associated with vomiting. No vomiting tonight.  She has followed up with cardiology (Dr. Alveda Reasons) and is scheduled for ECHO, carotid doppler studies and is currently wearing a holter monitor. She has had no fever, cough, congestion. Daughter reports no usual symptoms related to UTI.   The history is provided by a relative. No language interpreter was used.       Past Medical History:  Diagnosis Date  . Allergic rhinitis   . Anxiety   . Asthma   . Hyperlipidemia   . Migraine with aura, without mention of intractable migraine without mention of status migrainosus     Patient Active Problem List   Diagnosis Date Noted  . 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 Dysphagia 05/26/2020  . Community acquired pneumonia of right lower lobe of lung   . Acute metabolic encephalopathy 37/05/6268  . Weight loss, non-intentional 12/08/2017  . Relationship problem between partners 04/14/2017  . 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 H/O migraine 05/11/2015  . Dupuytren's  contracture 07/13/2014  . Encounter for therapeutic drug monitoring 12/31/2013  . Hyperlipidemia 11/29/2013  . Anxiety 11/29/2013  . Unspecified vitamin D deficiency 11/30/2012  . Depression 11/30/2012  . Mild cognitive impairment with memory loss 11/30/2012  . Hyperglycemia 11/30/2012  . Allergic rhinitis 05/09/2010    Past Surgical History:  Procedure Laterality Date  . APPENDECTOMY  1975  . BREAST BIOPSY Left 1976  . TUBAL LIGATION Bilateral 1976     OB History   No obstetric history on file.     Family History  Problem Relation Age of Onset  . Asthma Father   . Asthma Sister   . Heart disease Sister   . Cancer Mother        brain, breast  . Cancer Brother 89  . Heart disease Brother   . Hypertension Brother     Social History   Tobacco Use  . Smoking status: Never Smoker  . Smokeless tobacco: Never Used  . Tobacco comment: smoked  briefly in early 60's less than a year  Vaping Use  . Vaping Use: Never used  Substance Use Topics  . Alcohol use: No    Alcohol/week: 0.0 standard drinks  . Drug use: No    Home Medications Prior to Admission medications   Medication Sig Start Date End Date Taking? Authorizing Provider  Calcium Carb-Cholecalciferol (CALCIUM 500 + D) 500-200 MG-UNIT TABS Take 1 tablet by mouth daily.   Yes [provider]  cetirizine (ZYRTEC) 10 MG tablet Take 10 mg by mouth daily. Take one tablet once daily for  allergies    Yes [provider]  citalopram (CELEXA) 40 MG tablet TAKE 1 TABLET BY MOUTH ONCE DAILY Patient taking differently: Take 40 mg by mouth daily.  05/04/18  Yes Reed, Tiffany L, DO  denosumab (PROLIA) 60 MG/ML SOSY injection Inject 60 mg into the skin every 6 (six) months.   Yes [provider]  lovastatin (MEVACOR) 40 MG tablet TAKE 1 TABLET BY MOUTH EVERY DAY TO LOWER CHOLESTEROL Patient taking differently: Take 40 mg by mouth every evening.  07/03/18  Yes Reed, Tiffany L, DO  mirtazapine (REMERON) 15  MG tablet Take 15 mg by mouth every evening.  02/24/20  Yes [provider]  montelukast (SINGULAIR) 10 MG tablet TAKE 1 TABLET BY MOUTH EVERY DAY Patient taking differently: Take 10 mg by mouth every evening.  12/01/18  Yes Reed, Tiffany L, DO  omeprazole (PRILOSEC) 10 MG capsule Take 10 mg by mouth in the morning and at bedtime.   Yes [provider]    Allergies    Shrimp [shellfish allergy], Aspirin, Ceftin, and Ciprofloxacin hcl  Review of Systems   Review of Systems  Reason unable to perform ROS: ROS provided from daughter's history secondary to h/o Alzheimers.  Constitutional: Negative for activity change, appetite change and fever.  HENT: Negative.  Negative for congestion and trouble swallowing.   Respiratory: Negative.  Negative for cough and choking.   Cardiovascular: Negative.   Gastrointestinal: Positive for abdominal pain. Negative for vomiting.  Genitourinary: Negative for dysuria, frequency and hematuria.  Musculoskeletal: Negative.  Negative for neck stiffness.  Skin: Negative.  Negative for color change and rash.  Neurological: Positive for syncope. Negative for seizures.    Physical Exam Updated Vital Signs BP 112/64   Pulse 68   Temp 97.6 F (36.4 C) (Oral)   Resp 17   Ht 5\' 1"  (1.549 m)   Wt 59 kg   SpO2 96%   BMI 24.56 kg/m   Physical Exam Vitals and nursing note reviewed.  Constitutional:      General: She is not in acute distress.    Appearance: She is well-developed. She is not ill-appearing.  HENT:     Head: Normocephalic.  Eyes:     Extraocular Movements: Extraocular movements intact.     Conjunctiva/sclera: Conjunctivae normal.  Neck:     Vascular: No carotid bruit.  Cardiovascular:     Rate and Rhythm: Normal rate and regular rhythm.     Heart sounds: No murmur heard.   Pulmonary:     Effort: Pulmonary effort is normal.     Breath sounds: Normal breath sounds. No wheezing, rhonchi or rales.  Abdominal:     Palpations:  Abdomen is soft.     Tenderness: There is no abdominal tenderness. There is no guarding or rebound.  Musculoskeletal:        General: Normal range of motion.     Cervical back: Normal range of motion and neck supple.     Right lower leg: No edema.     Left lower leg: No edema.  Skin:    General: Skin is warm and dry.     Findings: No rash.  Neurological:     Mental Status: She is alert.     Comments: Follows commands. Not oriented to place or time. "Baseline mental status" per daughter.     ED Results / Procedures / Treatments   Labs (all labs ordered are listed, but only abnormal results are displayed) Labs Reviewed  CBC  WITH DIFFERENTIAL/PLATELET - Abnormal; Notable for the following components:      Result Value   Eosinophils Absolute 0.7 (*)    All other components within normal limits  COMPREHENSIVE METABOLIC PANEL - Abnormal; Notable for the following components:   Glucose, Bld 126 (*)    Albumin 3.4 (*)    All other components within normal limits  URINALYSIS, ROUTINE W REFLEX MICROSCOPIC - Abnormal; Notable for the following components:   APPearance CLOUDY (*)    Hgb urine dipstick SMALL (*)    Protein, ur 30 (*)    Leukocytes,Ua LARGE (*)    WBC, UA >50 (*)    Bacteria, UA FEW (*)    Non Squamous Epithelial 6-10 (*)    All other components within normal limits  URINE CULTURE  TROPONIN I (HIGH SENSITIVITY)  TROPONIN I (HIGH SENSITIVITY)    EKG EKG Interpretation  Date/Time:  Tuesday July 04 2020 21:48:32 EST Ventricular Rate:  62 PR Interval:    QRS Duration: 110 QT Interval:  449 QTC Calculation: 456 R Axis:     Text Interpretation: Sinus rhythm Abnormal R-wave progression, early transition Minimal ST elevation, inferior leads No significant change since last tracing Confirmed by Wandra Arthurs [phone]) on 07/04/2020 10:55:08 PM   Radiology CT Head Wo Contrast  Result Date: 07/05/2020 CLINICAL DATA:  Mental status change EXAM: CT HEAD WITHOUT  CONTRAST TECHNIQUE: Contiguous axial images were obtained from the base of the skull through the vertex without intravenous contrast. COMPARISON:  CT 11/04/2019 FINDINGS: Brain: No evidence of acute infarction, hemorrhage, hydrocephalus, extra-axial collection, visible mass lesion or mass effect. Symmetric prominence of the ventricles, cisterns and sulci compatible with parenchymal volume loss. Patchy and confluent areas of white matter hypoattenuation are most compatible with chronic microvascular angiopathy. Vascular: Atherosclerotic calcification of the carotid siphons and intradural vertebral arteries. No hyperdense vessel. Skull: No calvarial fracture or suspicious osseous lesion. No scalp swelling or hematoma. Sinuses/Orbits: Minimal mural thickening throughout the paranasal sinuses no layering air-fluid levels or pneumatized secretions. Mastoid air cells are well aerated. Middle ear cavities are clear. Orbital structures are unremarkable aside from prior lens extractions. Other: None. IMPRESSION: 1. No acute intracranial findings. 2. Chronic microvascular angiopathy and parenchymal volume loss. Electronically Signed   By: Lovena Le M.D.   On: 07/05/2020 00:15    Procedures Procedures (including critical care time)  Medications Ordered in ED Medications  famotidine (PEPCID) IVPB 20 mg premix (0 mg Intravenous Stopped 07/05/20 0018)  alum & mag hydroxide-simeth (MAALOX/MYLANTA) 200-200-20 MG/5ML suspension 30 mL (30 mLs Oral Given 07/04/20 2347)    And  lidocaine (XYLOCAINE) 2 % viscous mouth solution 15 mL (15 mLs Oral Given 07/04/20 2348)  LORazepam (ATIVAN) injection 0.5 mg (0.5 mg Intravenous Given 07/05/20 0205)  haloperidol lactate (HALDOL) injection 2 mg (2 mg Intravenous Given 07/05/20 0315)  haloperidol lactate (HALDOL) injection 2 mg (2 mg Intravenous Given 07/05/20 0359)    ED Course  I have reviewed the triage vital signs and the nursing notes.  Pertinent labs & imaging results  that were available during my care of the patient were reviewed by me and considered in my medical decision making (see chart for details).    MDM Rules/Calculators/A&P                          Patient to ED with caregiver/[name] for evaluation of recurrent episode of syncope as further detailed in the HPI.  She  is currently under evaluation by cardiology and PCP for DDx cardiogenic syncope vs vasovagal   The patient has been in the department for nearly 7 hours without recurrent symptoms. VSS. Her mental agitation increasing over time, becoming more confused, pulling at cardiac monitoring cords and IV, c/w symptoms witnessed at home as part of steady decline felt secondary to Alzheimers. Ativan provided to help with symptoms, then Haldol with some improvement.   Labs reviewed. Negative cardiac workup. We are unable to interpret any Holter monitor readings, however, EKG without arrhythmias or change since previous tracing. No fever. No evidence infection. No recurrent epigastric pain with benign abdominal exam.   She is felt appropriate for discharge home per plan discussed with my attending, Dr. Darl Householder. Daughter updated on all results and plan for discharge. No unanswered concerns.   Final Clinical Impression(s) / ED Diagnoses Final diagnoses:  Syncope, unspecified syncope type    Rx / DC Orders ED Discharge Orders    None       Charlann Lange, PA-C 07/05/20 0424    Drenda Freeze, MD 07/05/20 1300

## 2020-07-04 NOTE — ED Triage Notes (Addendum)
Pt arrives via EMS from from home. Pt had episode of syncope while her daughter was painting the pt's nails. Daughter assisted pt to floor. Pt has hx of multiple episodes of syncope, most recently 10/20. Pt's [facility] cardiologist told her to come to the hospital with any further syncopal episodes. Per daughter, pt has hx of late-stage of moderate Alzheimers. A&O x4 with EMS. Denies pain.   EMS: BP 131/64 HR 64, NSR CBG 129 RR 16 96% RA  20G L. Forearm

## 2020-07-04 NOTE — Telephone Encounter (Signed)
Sounds good thanks

## 2020-07-04 NOTE — ED Notes (Signed)
Patient transported to CT 

## 2020-07-04 NOTE — ED Notes (Signed)
Purewick placed on pt. 

## 2020-07-04 NOTE — Telephone Encounter (Signed)
Dr. Lamonte Sakai, please see below message from patient's daughter.   Hi Dr Lamonte Sakai, just an update that mom is back on Singulair. She was coughing in the AM and PM so Ive added it back. Will see if that helps. Thanks! [name]

## 2020-07-05 LAB — URINALYSIS, ROUTINE W REFLEX MICROSCOPIC
Bilirubin Urine: NEGATIVE
Glucose, UA: NEGATIVE mg/dL
Ketones, ur: NEGATIVE mg/dL
Nitrite: NEGATIVE
Protein, ur: 30 mg/dL — AB
Specific Gravity, Urine: 1.008 (ref 1.005–1.030)
WBC, UA: >50 WBC/hpf — ABNORMAL HIGH (ref 0–5)
pH: 8 (ref 5.0–8.0)

## 2020-07-05 LAB — TROPONIN I (HIGH SENSITIVITY)
Troponin I (High Sensitivity): 4 ng/L (ref <18)
Troponin I (High Sensitivity): 4 ng/L (ref <18)

## 2020-07-05 MED ORDER — HALOPERIDOL LACTATE 5 MG/ML IJ SOLN
2.0000 mg | Freq: Once | INTRAMUSCULAR | Status: AC
  Administered 2020-07-05: 2 mg via INTRAVENOUS
  Filled 2020-07-05: qty 1

## 2020-07-05 MED ORDER — LORAZEPAM 2 MG/ML IJ SOLN
0.5000 mg | Freq: Once | INTRAMUSCULAR | Status: AC
  Administered 2020-07-05: 0.5 mg via INTRAVENOUS
  Filled 2020-07-05: qty 1

## 2020-07-05 NOTE — ED Notes (Signed)
Pt displaced Purewick & was urinary incontinent in bed. Pt changed & new Purewick placed on pt

## 2020-07-05 NOTE — Discharge Instructions (Signed)
Follow up with your cardiologist as scheduled and your primary care provider for continued evaluation of recurrent syncope.   Return to the emergency department with any new or concerning symptoms.

## 2020-07-05 NOTE — ED Notes (Signed)
E-signature pad unavailable at time of pt discharge. This RN discussed discharge materials with pt and answered all pt questions. Pt and daughter stated understanding of discharge material.

## 2020-07-05 NOTE — ED Notes (Signed)
Urine culture sent down with u/a 

## 2020-07-07 LAB — URINE CULTURE: Culture: NO GROWTH

## 2020-07-11 NOTE — Telephone Encounter (Signed)
Dr. Lamonte Sakai, please see new mychart message sent by pt's daughter:   To: [facility]    From: Rosslyn Hathcock Adcox    Created: 07/11/2020 4:29 PM     *-*-*This message has not been handled.*-*-*  Mom has had an intermittent cough due to some drainage, but no biggie so far. I was talking with a care coordinator yesterday and wondered if Dr Lamonte Sakai would be willing to order 2 x monthly or 1 x weekly SLP in home to assist with mom's chewing and swallowing? If she is getting a necessary skilled service he can also order PCS services to support her eating/swallowing. Medicare will cover it. Just let me know. Thanks, [name]

## 2020-07-12 NOTE — Addendum Note (Signed)
Addended by: Desmond Dike C on: 07/12/2020 03:03 PM   Modules accepted: Orders

## 2020-07-12 NOTE — Telephone Encounter (Signed)
Yes, please ask for an SLP evaluation and their recommendations on treatments and how frequently she will require maintenance follow up.

## 2020-07-17 ENCOUNTER — Other Ambulatory Visit (HOSPITAL_COMMUNITY): Payer: Medicare Other

## 2020-07-22 DIAGNOSIS — J189 Pneumonia, unspecified organism: Secondary | ICD-10-CM

## 2020-07-22 DIAGNOSIS — J9601 Acute respiratory failure with hypoxia: Secondary | ICD-10-CM

## 2020-07-22 DIAGNOSIS — G478 Other sleep disorders: Secondary | ICD-10-CM

## 2020-07-24 ENCOUNTER — Telehealth: Payer: Self-pay | Admitting: Emergency Medicine

## 2020-07-24 NOTE — Telephone Encounter (Signed)
Can she come into see me tomorrow for a visit ?   Please contact office for sooner follow up if symptoms do not improve or worsen or seek emergency care

## 2020-07-24 NOTE — Telephone Encounter (Signed)
Appt scheduled with [name] tomorrow at 4:30. Tammy nor Aaron Edelman had availability for tomorrow.   Sending to [employer] as an [place].

## 2020-07-24 NOTE — Telephone Encounter (Signed)
Dr. Lamonte Sakai please advise on patient mychart message  Hi, Mom is having problems with cough from cold. This is her second round since our visit. She has had three episodes of what look like laryngospasms. She had one in front of me and two at the day program. She gets overwhelming anxiety each time and it lasts a few minutes. She starts shaking, can't breathe, panics and her O2 stats are dropping sharply into mid 80s. Simply put, her brain is dying and starting to impede her autonomic system. She is unable to time any breathing in to get a good dose of her rescue inhaler. Could we get her a nebulizer? I think it would help or even a spacer type product. It's a dry, unproductive cough right now. The nurse at her program said her lungs were clear. Just wondering how to proceed.thanks, [name]

## 2020-07-24 NOTE — Telephone Encounter (Signed)
Primary Pulmonologist: Byrum Last office visit and with whom: 06/27/20 with Byrum What do we see them for (pulmonary problems): upper airway resistance syndrome Last OV assessment/plan:  Assessment & Plan Note by Collene Gobble, MD at 06/27/2020 5:25 PM Author: Collene Gobble, MD Author Type: Physician Filed: 06/27/2020 5:26 PM  Note Status: Written Cosign: Cosign Not Required Encounter Date: 06/27/2020  Problem: Upper airway resistance syndrome  Editor: Collene Gobble, MD (Physician)                 Voice quality is improved, dyspnea and cough are improved.  She benefited from the omeprazole.  She also benefited from the discontinuation of Breo.  I do not think she has true asthma.  We will try stopping the Singulair to see if she tolerates.  Try stopping Singulair We will stay off Breo and fluticasone nasal spray Okay to continue Zyrtec as you have been taking it Continue omeprazole twice a day as you have been taking it. Continue to practice your swallowing precautions Follow Dr. Lamonte Sakai if you have any changes in your breathing or coughing      Was appointment offered to patient (explain)?  Pt wants to make appt if okay   Reason for call: Called and spoke with pt's [name] who states pt developed a cold 1 week ago having complaints of cough which is mainly a dry cough. [name] stated that pt has also had two incidents previously where pt could not catch her breath.  Pt also has complaints of weakness and has also had some wheezing. Pt has not had any complaints of fever as last temp today was 98.2  Pt has tried to use the albuterol inhaler but has not really been able to use it due to the weakness.  Pt did begin taking the singulair about 1 week after last visit with Dr. Lamonte Sakai as her coughing got worse when she was not taking it. Pt is not taking the breo but is just doing the albuterol when needed.  Pt's O2 sats have been ranging between 95-96% but [name] stated when pt had the  episodes where she could not catch her breath, sats dropped to the mid-80s and pt is not on any O2.  [name] stated that pt was covid tested 1 week ago and also 3 days ago due to her symptoms and both tests came up negative.  Pt has received her covid vaccines.  [name] is wanting recommendations and also if a nebulizer might be able to be prescribed due to pt not able to get much use out of the rescue inhaler. A mychart message was also sent by pt's daughter 07/22/20 which is able to be seen in pt's chart.  Tammy, please advise on this for pt and [name].  Allergies  Allergen Reactions  . 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 Aspirin     Advised by MD previously not to take  . Ceftin     Dizzy & Diarrhea  . Ciprofloxacin Hcl     Dizzy, diarrhea    Immunization History  Administered Date(s) Administered  . Fluad Quad(high Dose 65+) 05/13/2020  . Influenza Split 04/27/2012  . Influenza Whole 05/26/2009, 04/26/2010, 05/27/2011  . Influenza, High Dose Seasonal PF 05/17/2017, 04/23/2018  . Influenza-Unspecified 05/07/2013, 05/09/2014, 05/05/2015, 05/06/2016, 06/10/2019  . Moderna SARS-COVID-2 Vaccination 04/03/2020, 04/09/2020  . PFIZER SARS-COV-2 Vaccination 09/10/2019, 10/05/2019  . Pneumococcal Conjugate-13 11/29/2013  . Pneumococcal Polysaccharide-23 04/27/2007  . Td 07/19/2013  . Tdap 11/04/2019  .  Zoster 03/26/2013  . Zoster Recombinat (Shingrix) 12/28/2019

## 2020-07-25 ENCOUNTER — Ambulatory Visit: Payer: Medicare Other | Admitting: Primary Care

## 2020-07-25 ENCOUNTER — Other Ambulatory Visit: Payer: Self-pay

## 2020-07-25 ENCOUNTER — Ambulatory Visit (INDEPENDENT_AMBULATORY_CARE_PROVIDER_SITE_OTHER): Payer: Medicare Other

## 2020-07-25 ENCOUNTER — Encounter: Payer: Self-pay | Admitting: Primary Care

## 2020-07-25 VITALS — BP 104/62 | HR 91 | Ht 61.0 in | Wt 122.0 lb

## 2020-07-25 DIAGNOSIS — J9601 Acute respiratory failure with hypoxia: Secondary | ICD-10-CM | POA: Diagnosis not present

## 2020-07-25 DIAGNOSIS — G478 Other sleep disorders: Secondary | ICD-10-CM

## 2020-07-25 DIAGNOSIS — R0602 Shortness of breath: Secondary | ICD-10-CM

## 2020-07-25 DIAGNOSIS — J9600 Acute respiratory failure, unspecified whether with hypoxia or hypercapnia: Secondary | ICD-10-CM | POA: Insufficient documentation

## 2020-07-25 MED ORDER — BUDESONIDE 0.25 MG/2ML IN SUSP: 0.2500 mg | Freq: Two times a day (BID) | RESPIRATORY_TRACT | 1 refills | Status: AC

## 2020-07-25 MED ORDER — DOXYCYCLINE HYCLATE 100 MG PO TABS: 100.0000 mg | ORAL_TABLET | Freq: Two times a day (BID) | ORAL | 0 refills | Status: DC

## 2020-07-25 NOTE — Addendum Note (Signed)
Addended by: Lorretta Harp on: 07/25/2020 05:47 PM   Modules accepted: Orders

## 2020-07-25 NOTE — Assessment & Plan Note (Signed)
-   Potentially d.t underlying alzheimer's disease , checking CXR to rule out aspiration pneumonia  - O2 80% on RA, recovered to 97% on 2L - Sending in DME order for new oxygen start

## 2020-07-25 NOTE — Progress Notes (Signed)
@Patient  ID: Melinda Griffin, female    DOB: [birth year], 79 y.o.   MRN: [id number]  Chief Complaint  Patient presents with  . Acute Visit    Pt has had a productive cough after stopping singulair. Pt did start the singulair back 11/9 due to throat irritation and coughing since being off of it. Pt has also had complaints of increased SOB and weakness. Pt has also been losing weight.    Referring provider: Seward Carol, MD  HPI: 79 year old female, never smoked. PMH significant for upper airway resistance syndrome, allergic rhinitis, CAP right lower lobe, dysphagia, mild cognitive impairment, osteoporosis. Patient of Dr. Lamonte Sakai, 06/27/20. At that visit we stopped Singuliar and it was recommended to stay off Breo and fluticasone.   07/25/2020 Patient called our office on 07/24/20 with reports of dry cough and increased shortness of breath x 1-2 weeks. She developed a cough on 07/04/20, patient's daughter re-started her on Singulair. She had a syncopal episode that night. They went to the ED, daughter states that it was traumatic. Patient becomes agitated and confused. She was given ativan and haldol. She had to be physically restrained to obtain a urine sample. She was sent home. She has been seen by cardiology, they do not feel syncope it is related to her heart. She [facility] [employer] [facility] during the day. Prior to thanksgiving she had a laryngeal spasm at the day program which caused her to become short of breath,  underlying anxiety/panic attack. Potentially aspirating but this has not been overtly observed by her daughter. She continued to have these episodes of cough and shortness of breath. Cold air makes her cough worse. Daughter states that her cough is dry hacking. She is more fatigued with a decreased appetite. Stopped BREO d/t throat irritation. She has difficulty using Ventolin inhaler but Albuterol nebulizer solution has been helping more. Daughter reports after using  nebulizer she slept better last night with no coughing. She was up doing more today. She was able to walk to the car today by herself. Daughter denies purulent mucus or fever. Daughter is not interested in going back to hospital at this time. She is interested in palliative care, this will likely be ordered by neurology d/t hx Alzheimers. O2 today was 80% on room air. Newly requiring 2L oxygen. Patient is DNR/I.    Allergies  Allergen Reactions  . 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 Aspirin     Advised by MD previously not to take  . Ceftin     Dizzy & Diarrhea  . Ciprofloxacin Hcl     Dizzy, diarrhea    Immunization History  Administered Date(s) Administered  . Fluad Quad(high Dose 65+) 05/13/2020  . Influenza Split 04/27/2012  . Influenza Whole 05/26/2009, 04/26/2010, 05/27/2011  . Influenza, High Dose Seasonal PF 05/17/2017, 04/23/2018  . Influenza-Unspecified 05/07/2013, 05/09/2014, 05/05/2015, 05/06/2016, 06/10/2019  . Moderna SARS-COVID-2 Vaccination 04/03/2020, 04/09/2020  . PFIZER SARS-COV-2 Vaccination 09/10/2019, 10/05/2019  . Pneumococcal Conjugate-13 11/29/2013  . Pneumococcal Polysaccharide-23 04/27/2007  . Td 07/19/2013  . Tdap 11/04/2019  . Zoster 03/26/2013  . Zoster Recombinat (Shingrix) 12/28/2019    Past Medical History:  Diagnosis Date  . Allergic rhinitis   . Anxiety   . Asthma   . Hyperlipidemia   . Migraine with aura, without mention of intractable migraine without mention of status migrainosus     Tobacco History: Social History   Tobacco Use  Smoking Status Never Smoker  Smokeless Tobacco Never Used  Tobacco  Comment   smoked  briefly in early 60's less than a year   Counseling given: Not Answered Comment: smoked  briefly in early 60's less than a year   Outpatient Medications Prior to Visit  Medication Sig Dispense Refill  . albuterol (VENTOLIN HFA) 108 (90 Base) MCG/ACT inhaler as needed.    . Calcium Carb-Cholecalciferol  (CALCIUM 500 + D) 500-200 MG-UNIT TABS Take 1 tablet by mouth daily.    . cetirizine (ZYRTEC) 10 MG tablet Take 10 mg by mouth daily. Take one tablet once daily for allergies     . citalopram (CELEXA) 40 MG tablet TAKE 1 TABLET BY MOUTH ONCE DAILY (Patient taking differently: Take 40 mg by mouth daily. ) 90 tablet 1  . denosumab (PROLIA) 60 MG/ML SOSY injection Inject 60 mg into the skin every 6 (six) months.    . lovastatin (MEVACOR) 40 MG tablet TAKE 1 TABLET BY MOUTH EVERY DAY TO LOWER CHOLESTEROL (Patient taking differently: Take 40 mg by mouth every evening. ) 90 tablet 1  . mirtazapine (REMERON) 15 MG tablet Take 15 mg by mouth every evening.     . montelukast (SINGULAIR) 10 MG tablet TAKE 1 TABLET BY MOUTH EVERY DAY (Patient taking differently: Take 10 mg by mouth every evening. ) 90 tablet 1  . omeprazole (PRILOSEC) 10 MG capsule Take 10 mg by mouth in the morning and at bedtime.    . fluticasone furoate-vilanterol (BREO ELLIPTA) 200-25 MCG/INH AEPB INHALE 1 PUFF BY MOUTH EVERY DAY     No facility-administered medications prior to visit.   Review of Systems  Review of Systems  Constitutional: Positive for unexpected weight change. Negative for chills and fever.  Respiratory: Positive for cough and shortness of breath. Negative for chest tightness and wheezing.   Cardiovascular: Negative.   Neurological: Positive for syncope.   Physical Exam  BP 104/62 (BP Location: Right Arm, Cuff Size: Normal)   Pulse 91   Ht 5\' 1"  (1.549 m)   Wt 122 lb (55.3 kg)   SpO2 97%   BMI 23.05 kg/m  Physical Exam Constitutional:      General: She is not in acute distress.    Appearance: She is normal weight. She is ill-appearing. She is not diaphoretic.     Comments: Chronically ill appearing   HENT:     Head: Normocephalic and atraumatic.     Mouth/Throat:     Comments: Deferred d.t masking Cardiovascular:     Rate and Rhythm: Normal rate.     Comments: No edema Pulmonary:     Effort:  Pulmonary effort is normal.     Breath sounds: No stridor. Rhonchi present. No wheezing or rales.     Comments: Scant rhonchi right middle lobe. Good effort, difficulty following instructions d/t alzheimer. O2 80% RA with ambulation, improved to 97% on 2L.  Musculoskeletal:     Cervical back: Normal range of motion and neck supple.  Skin:    General: Skin is dry.  Neurological:     General: No focal deficit present.     Mental Status: She is alert. Mental status is at baseline.     Comments: Hx Alzheimer's       Lab Results:  CBC    Component Value Date/Time   WBC 7.6 07/04/2020 2246   RBC 4.01 07/04/2020 2246   HGB 12.3 07/04/2020 2246   HGB 14.1 10/18/2015 1036   HCT 37.5 07/04/2020 2246   HCT 41.3 10/18/2015 1036   PLT 203 07/04/2020  2246   PLT 293 10/18/2015 1036   MCV 93.5 07/04/2020 2246   MCV 92 10/18/2015 1036   MCH 30.7 07/04/2020 2246   MCHC 32.8 07/04/2020 2246   RDW 13.2 07/04/2020 2246   RDW 13.7 10/18/2015 1036   LYMPHSABS 2.3 07/04/2020 2246   LYMPHSABS 1.9 10/18/2015 1036   MONOABS 0.9 07/04/2020 2246   EOSABS 0.7 (H) 07/04/2020 2246   EOSABS 0.5 (H) 10/18/2015 1036   BASOSABS 0.1 07/04/2020 2246   BASOSABS 0.1 10/18/2015 1036    BMET    Component Value Date/Time   NA 136 07/04/2020 2246   NA 142 10/18/2015 1036   K 4.7 07/04/2020 2246   CL 104 07/04/2020 2246   CO2 25 07/04/2020 2246   GLUCOSE 126 (H) 07/04/2020 2246   BUN 17 07/04/2020 2246   BUN 11 10/18/2015 1036   CREATININE 0.74 07/04/2020 2246   CREATININE 0.84 05/04/2018 1109   CALCIUM 8.9 07/04/2020 2246   GFRNONAA >60 07/04/2020 2246   GFRNONAA 66 05/04/2018 1109   GFRAA 77 05/04/2018 1109    BNP No results found for: BNP  ProBNP No results found for: PROBNP  Imaging: CT Head Wo Contrast  Result Date: 07/05/2020 CLINICAL DATA:  Mental status change EXAM: CT HEAD WITHOUT CONTRAST TECHNIQUE: Contiguous axial images were obtained from the base of the skull through the  vertex without intravenous contrast. COMPARISON:  CT 11/04/2019 FINDINGS: Brain: No evidence of acute infarction, hemorrhage, hydrocephalus, extra-axial collection, visible mass lesion or mass effect. Symmetric prominence of the ventricles, cisterns and sulci compatible with parenchymal volume loss. Patchy and confluent areas of white matter hypoattenuation are most compatible with chronic microvascular angiopathy. Vascular: Atherosclerotic calcification of the carotid siphons and intradural vertebral arteries. No hyperdense vessel. Skull: No calvarial fracture or suspicious osseous lesion. No scalp swelling or hematoma. Sinuses/Orbits: Minimal mural thickening throughout the paranasal sinuses no layering air-fluid levels or pneumatized secretions. Mastoid air cells are well aerated. Middle ear cavities are clear. Orbital structures are unremarkable aside from prior lens extractions. Other: None. IMPRESSION: 1. No acute intracranial findings. 2. Chronic microvascular angiopathy and parenchymal volume loss. Electronically Signed   By: Lovena Le M.D.   On: 07/05/2020 00:15   CARDIAC EVENT MONITOR  Result Date: 07/14/2020 Indication: Syncope Monitor duration: 11 days Minimum HR (bpm): 51 Maximum HR (bpm): 141 Supraventricular Ectopy: <1% SVT: none Ventricular Ectopy: <1% NSVT: none Ventricular Tachycardia: none Pauses: none AV block: none Atrial fibrillation: none Diary events: none   Rare ectopy, no findings to support an etiology for syncope.    Assessment & Plan:  79 year old female, never smoked. Followed by Dr. Lamonte Sakai with [facility] for upper airway resistance syndrome. Patient's daughter reports increased shortness of breath and hacking cough since 07/04/20 after stopping Singulair. She is not on BREO d.t throat irritation. Potentially aspirating but not witnessed. She has underlying alzheimer's disease which she follows with neurology for. Her O2 today was 80% on RA with exertion, improved  to 97% on 2L. Checking stat CXR today. I will start her on Pulmicort nebulizer 0.25mg  twice daily, recommend using Albuterol nebulizer 3-4 times a day and sending in RX for Doxycycline 100mg  BID x 7 days. New order for oxygen 2L/min will be sent to [facility]. Patient is DNR/I, daughter does not want to return to hospital at this time. Family is intersted in pallative care. If patient fails to improve in outpatient setting recommend ED evaluation.   Upper airway resistance syndrome - Singulair and  BREO were stopped at last visit. Since then patient's daughter reports increased cough and shortness of breath - Adding Pulmicort (Budesonide) 0.25mg /6ml nebulized solution twice daily - Recommend using Albuterol nebulizer 3-4 times a day  Acute respiratory failure ([place]) - Potentially d.t underlying alzheimer's disease , checking CXR to rule out aspiration pneumonia  - O2 80% on RA, recovered to 97% on 2L - Sending in DME order for new oxygen start   FU in 1 week or sooner   Martyn Ehrich, NP 07/25/2020

## 2020-07-25 NOTE — Patient Instructions (Addendum)
Recommendations: - Start Pulmicort every 12 hours (morning and evening) - Use Albuterol 3-4 times a day scheduled  - Ok to use Pulmicort and albuterol right after each other but do not combine. Use Albuterol first then pulmicort (do not combine) - Take regular mucinex 600-1200mg  twice a day with water  - Use flutter valve 2-3 times a day  - Wear 2L oxygen to maintain O2 >88%   Orders: - CXR today re: cough  - New order oxygen 2L continuously   Rx: - Doxycyline 1 tab twice daily x 1 week (avoid direct sunlight)  Follow-up: 1 week with Dr. Lamonte Sakai or Eustaquio Maize NP   Home Oxygen Use, Adult When a medical condition keeps you from getting enough oxygen, your health care provider may instruct you to take extra oxygen at home. Your health care provider will let you know:  When to take oxygen.  For how long to take oxygen.  How quickly oxygen should be delivered (flow rate), in liters per minute (LPM or L/M). Home oxygen can be given through:  A mask.  A nasal cannula. This is a device or tube that goes in the nostrils.  A transtracheal catheter. This is a small, flexible tube placed in the trachea.  A tracheostomy. This is a surgically made opening in the trachea. These devices are connected with tubing to an oxygen source, such as:  A tank. Tanks hold oxygen in gas form. They must be replaced when the oxygen is used up.  A liquid oxygen device. This holds oxygen in liquid form. It must be replaced when the oxygen is used up.  An oxygen concentrator machine. This filters oxygen in the room. It uses electricity, so you must have a backup cylinder of oxygen in case the power goes out. Supplies needed: To use oxygen, you will need:  A mask, nasal cannula, transtracheal catheter, or tracheostomy.  An oxygen tank, a liquid oxygen device, or an oxygen concentrator.  The tape that your health care provider recommends (optional). If you use a transtracheal catheter and your prescribed  flow rate is 1 LPM or greater, you will also need a humidifier. Risks and complications  Fire. This can happen if the oxygen is exposed to a heat source, flame, or spark.  Injury to skin. This can happen if liquid oxygen touches your skin.  Organ damage. This can happen if you get too little oxygen. How to use oxygen Your health care provider or a representative from your [place] will show you how to use your oxygen device. Follow her or his instructions. The instructions may look something like this: 1. Wash your hands. 2. If you use an oxygen concentrator, make sure it is plugged in. 3. Place one end of the tube into the port on the tank, device, or machine. 4. Place the mask over your nose and mouth. Or, place the nasal cannula and secure it with tape if instructed. If you use a tracheostomy or transtracheal catheter, connect it to the oxygen source as directed. 5. Make sure the liter-flow setting on the machine is at the level prescribed by your health care provider. 6. Turn on the machine or adjust the knob on the tank or device to the correct liter-flow setting. 7. When you are done, turn off and unplug the machine, or turn the knob to OFF. How to clean and care for the oxygen supplies Nasal cannula  Clean it with a warm, wet cloth daily or as needed.  Wash  it with a liquid soap once a week.  Rinse it thoroughly once or twice a week.  Replace it every 2-4 weeks.  If you have an infection, such as a cold or pneumonia, change the cannula when you get better. Mask  Replace it every 2-4 weeks.  If you have an infection, such as a cold or pneumonia, change the mask when you get better. Humidifier bottle  Wash the bottle between each refill: ? Wash it with soap and warm water. ? Rinse it thoroughly. ? Disinfect it and its top. ? Air-dry it.  Make sure it is dry before you refill it. Oxygen concentrator  Clean the air filter at least twice a week according  to directions from your home medical equipment and service company.  Wipe down the cabinet every day. To do this: ? Unplug the unit. ? Wipe down the cabinet with a damp cloth. ? Dry the cabinet. Other equipment  Change any extra tubing every 1-3 months.  Follow instructions from your health care provider about taking care of any other equipment. Safety tips Fire safety tips   Keep your oxygen and oxygen supplies at least 5 ft away from sources of heat, flames, and sparks at all times.  Do not allow smoking near your oxygen. Put up "no smoking" signs in your home. Avoid smoking areas when in public.  Do not use materials that can burn (are flammable) while you use oxygen.  When you go to a restaurant with portable oxygen, ask to be seated in the nonsmoking section.  Keep a Data processing manager close by. Let your fire department know that you have oxygen in your home.  Test your home smoke detectors regularly. Traveling  Secure your oxygen tank in the vehicle so that it does not move around. Follow instructions from your medical device company about how to safely secure your tank.  Make sure you have enough oxygen for the amount of time you will be away from home.  If you are planning air travel, contact the airline to find out if they allow the use of an approved portable oxygen concentrator. You may also need documents from your health care provider and medical device company before you travel. General safety tips  If you use an oxygen cylinder, make sure it is in a stand or secured to an object that will not move (fixed object).  If you use liquid oxygen, make sure its container is kept upright.  If you use an oxygen concentrator: ? Dance movement psychotherapist company. Make sure you are given priority service in the event that your power goes out. ? Avoid using extension cords, if possible. Follow these instructions at home:  Use oxygen only as told by your health care  provider.  Do not use alcohol or other drugs that make you relax (sedating drugs) unless instructed. They can slow down your breathing rate and make it hard to get in enough oxygen.  Know how and when to order a refill of oxygen.  Always keep a spare tank of oxygen. Plan ahead for holidays when you may not be able to get a prescription filled.  Use water-based lubricants on your lips or nostrils. Do not use oil-based products like petroleum jelly.  To prevent skin irritation on your cheeks or behind your ears, tuck some gauze under the tubing. Contact a health care provider if:  You get headaches often.  You have shortness of breath.  You have a lasting cough.  You have  anxiety.  You are sleepy all the time.  You develop an illness that affects your breathing.  You cannot exercise at your regular level.  You are restless.  You have difficult or irregular breathing, and it is getting worse.  You have a fever.  You have persistent redness under your nose. Get help right away if:  You are confused.  You have blue lips or fingernails.  You are struggling to breathe. Summary  Your health care provider or a representative from your [place] will show you how to use your oxygen device. Follow her or his instructions.  If you use an oxygen concentrator, make sure it is plugged in.  Make sure the liter-flow setting on the machine is at the level prescribed by your health care provider.  Keep your oxygen and oxygen supplies at least 5 ft away from sources of heat, flames, and sparks at all times. This information is not intended to replace advice given to you by your health care provider. Make sure you discuss any questions you have with your health care provider. Document Revised: 01/29/2018 Document Reviewed: 03/05/2016 Elsevier Patient Education  [place].

## 2020-07-25 NOTE — Assessment & Plan Note (Addendum)
-   Singulair and BREO were stopped at last visit. Since then patient's daughter reports increased cough and shortness of breath - Adding Pulmicort (Budesonide) 0.25mg /27ml nebulized solution twice daily - Recommend using Albuterol nebulizer 3-4 times a day

## 2020-07-26 ENCOUNTER — Other Ambulatory Visit: Payer: Self-pay

## 2020-07-26 ENCOUNTER — Encounter (HOSPITAL_COMMUNITY): Payer: Self-pay

## 2020-07-26 ENCOUNTER — Inpatient Hospital Stay (HOSPITAL_COMMUNITY)
Admission: EM | Admit: 2020-07-26 | Discharge: 2020-07-31 | DRG: 193 | Disposition: A | Discharge status: Skilled Nursing Facility | Payer: Medicare Other | Attending: Internal Medicine | Admitting: Internal Medicine

## 2020-07-26 ENCOUNTER — Ambulatory Visit (HOSPITAL_COMMUNITY)
Admission: RE | Admit: 2020-07-26 | Payer: Medicare Other | Source: Ambulatory Visit | Attending: Internal Medicine | Admitting: Internal Medicine

## 2020-07-26 ENCOUNTER — Telehealth: Payer: Self-pay | Admitting: Emergency Medicine

## 2020-07-26 DIAGNOSIS — Z79890 Hormone replacement therapy (postmenopausal): Secondary | ICD-10-CM

## 2020-07-26 DIAGNOSIS — J9601 Acute respiratory failure with hypoxia: Secondary | ICD-10-CM

## 2020-07-26 DIAGNOSIS — J9600 Acute respiratory failure, unspecified whether with hypoxia or hypercapnia: Secondary | ICD-10-CM | POA: Diagnosis present

## 2020-07-26 DIAGNOSIS — Z515 Encounter for palliative care: Secondary | ICD-10-CM

## 2020-07-26 DIAGNOSIS — J129 Viral pneumonia, unspecified: Secondary | ICD-10-CM | POA: Diagnosis not present

## 2020-07-26 DIAGNOSIS — R55 Syncope and collapse: Secondary | ICD-10-CM

## 2020-07-26 DIAGNOSIS — R739 Hyperglycemia, unspecified: Secondary | ICD-10-CM | POA: Diagnosis present

## 2020-07-26 DIAGNOSIS — Z7951 Long term (current) use of inhaled steroids: Secondary | ICD-10-CM

## 2020-07-26 DIAGNOSIS — Z881 Allergy status to other antibiotic agents status: Secondary | ICD-10-CM

## 2020-07-26 DIAGNOSIS — R0602 Shortness of breath: Secondary | ICD-10-CM

## 2020-07-26 DIAGNOSIS — Z20822 Contact with and (suspected) exposure to covid-19: Secondary | ICD-10-CM | POA: Diagnosis present

## 2020-07-26 DIAGNOSIS — Z87892 Personal history of anaphylaxis: Secondary | ICD-10-CM

## 2020-07-26 DIAGNOSIS — E860 Dehydration: Secondary | ICD-10-CM | POA: Diagnosis present

## 2020-07-26 DIAGNOSIS — Z66 Do not resuscitate: Secondary | ICD-10-CM | POA: Diagnosis present

## 2020-07-26 DIAGNOSIS — Z825 Family history of asthma and other chronic lower respiratory diseases: Secondary | ICD-10-CM

## 2020-07-26 DIAGNOSIS — Z91013 Allergy to seafood: Secondary | ICD-10-CM

## 2020-07-26 DIAGNOSIS — J189 Pneumonia, unspecified organism: Secondary | ICD-10-CM | POA: Diagnosis not present

## 2020-07-26 DIAGNOSIS — Z79899 Other long term (current) drug therapy: Secondary | ICD-10-CM

## 2020-07-26 DIAGNOSIS — F0390 Unspecified dementia without behavioral disturbance: Secondary | ICD-10-CM | POA: Diagnosis present

## 2020-07-26 DIAGNOSIS — Z781 Physical restraint status: Secondary | ICD-10-CM

## 2020-07-26 DIAGNOSIS — Z886 Allergy status to analgesic agent status: Secondary | ICD-10-CM

## 2020-07-26 DIAGNOSIS — E872 Acidosis: Secondary | ICD-10-CM | POA: Diagnosis not present

## 2020-07-26 DIAGNOSIS — F419 Anxiety disorder, unspecified: Secondary | ICD-10-CM | POA: Diagnosis present

## 2020-07-26 DIAGNOSIS — J45909 Unspecified asthma, uncomplicated: Secondary | ICD-10-CM | POA: Diagnosis present

## 2020-07-26 DIAGNOSIS — K219 Gastro-esophageal reflux disease without esophagitis: Secondary | ICD-10-CM | POA: Diagnosis present

## 2020-07-26 DIAGNOSIS — R0902 Hypoxemia: Secondary | ICD-10-CM

## 2020-07-26 DIAGNOSIS — E440 Moderate protein-calorie malnutrition: Secondary | ICD-10-CM | POA: Insufficient documentation

## 2020-07-26 DIAGNOSIS — G9340 Encephalopathy, unspecified: Secondary | ICD-10-CM | POA: Diagnosis present

## 2020-07-26 DIAGNOSIS — E785 Hyperlipidemia, unspecified: Secondary | ICD-10-CM | POA: Diagnosis present

## 2020-07-26 LAB — CBC WITH DIFFERENTIAL/PLATELET
Abs Immature Granulocytes: 0.05 10*3/uL (ref 0.00–0.07)
Basophils Absolute: 0.1 10*3/uL (ref 0.0–0.1)
Basophils Relative: 1 %
Eosinophils Absolute: 0.9 10*3/uL — ABNORMAL HIGH (ref 0.0–0.5)
Eosinophils Relative: 9 %
HCT: 39 % (ref 36.0–46.0)
Hemoglobin: 13.5 g/dL (ref 12.0–15.0)
Immature Granulocytes: 1 %
Lymphocytes Relative: 23 %
Lymphs Abs: 2.2 10*3/uL (ref 0.7–4.0)
MCH: 31.3 pg (ref 26.0–34.0)
MCHC: 34.6 g/dL (ref 30.0–36.0)
MCV: 90.3 fL (ref 80.0–100.0)
Monocytes Absolute: 1.2 10*3/uL — ABNORMAL HIGH (ref 0.1–1.0)
Monocytes Relative: 12 %
Neutro Abs: 5.5 10*3/uL (ref 1.7–7.7)
Neutrophils Relative %: 54 %
Platelets: 334 10*3/uL (ref 150–400)
RBC: 4.32 MIL/uL (ref 3.87–5.11)
RDW: 13 % (ref 11.5–15.5)
WBC: 9.9 10*3/uL (ref 4.0–10.5)
nRBC: 0 % (ref 0.0–0.2)

## 2020-07-26 LAB — URINALYSIS, ROUTINE W REFLEX MICROSCOPIC
Bilirubin Urine: NEGATIVE
Glucose, UA: NEGATIVE mg/dL
Hgb urine dipstick: NEGATIVE
Ketones, ur: 20 mg/dL — AB
Nitrite: NEGATIVE
Protein, ur: NEGATIVE mg/dL
Specific Gravity, Urine: 1.019 (ref 1.005–1.030)
WBC, UA: >50 WBC/hpf — ABNORMAL HIGH (ref 0–5)
pH: 5 (ref 5.0–8.0)

## 2020-07-26 LAB — RESPIRATORY PANEL BY PCR

## 2020-07-26 LAB — COMPREHENSIVE METABOLIC PANEL
ALT: 16 U/L (ref 0–44)
AST: 18 U/L (ref 15–41)
Albumin: 3.5 g/dL (ref 3.5–5.0)
Alkaline Phosphatase: 69 U/L (ref 38–126)
Anion gap: 13 (ref 5–15)
BUN: 19 mg/dL (ref 8–23)
CO2: 24 mmol/L (ref 22–32)
Calcium: 9.2 mg/dL (ref 8.9–10.3)
Chloride: 103 mmol/L (ref 98–111)
Creatinine, Ser: 0.74 mg/dL (ref 0.44–1.00)
GFR, Estimated: >60 mL/min (ref >60)
Glucose, Bld: 112 mg/dL — ABNORMAL HIGH (ref 70–99)
Potassium: 3.8 mmol/L (ref 3.5–5.1)
Sodium: 140 mmol/L (ref 135–145)
Total Bilirubin: 0.7 mg/dL (ref 0.3–1.2)
Total Protein: 8.1 g/dL (ref 6.5–8.1)

## 2020-07-26 LAB — CBC
HCT: 37.2 % (ref 36.0–46.0)
Hemoglobin: 12.3 g/dL (ref 12.0–15.0)
MCH: 30.7 pg (ref 26.0–34.0)
MCHC: 33.1 g/dL (ref 30.0–36.0)
MCV: 92.8 fL (ref 80.0–100.0)
Platelets: 312 10*3/uL (ref 150–400)
RBC: 4.01 MIL/uL (ref 3.87–5.11)
RDW: 12.8 % (ref 11.5–15.5)
WBC: 10.9 10*3/uL — ABNORMAL HIGH (ref 4.0–10.5)
nRBC: 0 % (ref 0.0–0.2)

## 2020-07-26 LAB — TROPONIN I (HIGH SENSITIVITY)
Troponin I (High Sensitivity): 3 ng/L (ref <18)
Troponin I (High Sensitivity): 3 ng/L (ref <18)

## 2020-07-26 LAB — RESP PANEL BY RT-PCR (FLU A&B, COVID) ARPGX2
Influenza A by PCR: NEGATIVE
Influenza B by PCR: NEGATIVE
SARS Coronavirus 2 by RT PCR: NEGATIVE

## 2020-07-26 LAB — BLOOD GAS, VENOUS
Acid-Base Excess: 1.3 mmol/L (ref 0.0–2.0)
Bicarbonate: 25.1 mmol/L (ref 20.0–28.0)
O2 Saturation: 99 %
Patient temperature: 98.6
pCO2, Ven: 38.7 mmHg — ABNORMAL LOW (ref 44.0–60.0)
pH, Ven: 7.427 (ref 7.250–7.430)
pO2, Ven: 174 mmHg — ABNORMAL HIGH (ref 32.0–45.0)

## 2020-07-26 LAB — D-DIMER, QUANTITATIVE: D-Dimer, Quant: 1.03 ug/mL-FEU — ABNORMAL HIGH (ref 0.00–0.50)

## 2020-07-26 LAB — PROCALCITONIN: Procalcitonin: <0.1 ng/mL

## 2020-07-26 LAB — LACTIC ACID, PLASMA: Lactic Acid, Venous: 0.8 mmol/L (ref 0.5–1.9)

## 2020-07-26 MED ORDER — IPRATROPIUM BROMIDE 0.02 % IN SOLN
0.5000 mg | Freq: Four times a day (QID) | RESPIRATORY_TRACT | Status: DC
  Filled 2020-07-26: qty 2.5

## 2020-07-26 MED ORDER — ONDANSETRON HCL 4 MG PO TABS: 4.0000 mg | ORAL_TABLET | Freq: Four times a day (QID) | ORAL | Status: DC | PRN

## 2020-07-26 MED ORDER — SODIUM CHLORIDE 0.9 % IV SOLN
500.0000 mg | Freq: Once | INTRAVENOUS | Status: AC
  Administered 2020-07-26: 500 mg via INTRAVENOUS
  Filled 2020-07-26: qty 500

## 2020-07-26 MED ORDER — ACETAMINOPHEN 325 MG PO TABS: 650.0000 mg | ORAL_TABLET | Freq: Four times a day (QID) | ORAL | Status: DC | PRN

## 2020-07-26 MED ORDER — PRAVASTATIN SODIUM 20 MG PO TABS
40.0000 mg | ORAL_TABLET | Freq: Every day | ORAL | Status: DC
  Administered 2020-07-27 – 2020-07-30 (×3): 40 mg via ORAL
  Filled 2020-07-26 (×3): qty 2

## 2020-07-26 MED ORDER — SODIUM CHLORIDE 0.9 % IV SOLN: INTRAVENOUS | Status: AC

## 2020-07-26 MED ORDER — ONDANSETRON HCL 4 MG/2ML IJ SOLN: 4.0000 mg | Freq: Four times a day (QID) | INTRAMUSCULAR | Status: DC | PRN

## 2020-07-26 MED ORDER — IPRATROPIUM BROMIDE 0.02 % IN SOLN
0.5000 mg | Freq: Two times a day (BID) | RESPIRATORY_TRACT | Status: DC
  Filled 2020-07-26: qty 2.5

## 2020-07-26 MED ORDER — OLANZAPINE 10 MG IM SOLR
2.5000 mg | Freq: Once | INTRAMUSCULAR | Status: AC
  Administered 2020-07-26: 2.5 mg via INTRAMUSCULAR
  Filled 2020-07-26: qty 10

## 2020-07-26 MED ORDER — BUDESONIDE 0.25 MG/2ML IN SUSP
0.2500 mg | Freq: Two times a day (BID) | RESPIRATORY_TRACT | Status: DC
  Administered 2020-07-27 – 2020-07-31 (×7): 0.25 mg via RESPIRATORY_TRACT
  Filled 2020-07-26 (×10): qty 2

## 2020-07-26 MED ORDER — LEVALBUTEROL HCL 0.63 MG/3ML IN NEBU
0.6300 mg | INHALATION_SOLUTION | Freq: Four times a day (QID) | RESPIRATORY_TRACT | Status: DC
  Filled 2020-07-26: qty 3

## 2020-07-26 MED ORDER — SODIUM CHLORIDE 0.9 % IV SOLN
2.0000 g | INTRAVENOUS | Status: AC
  Administered 2020-07-27 – 2020-07-30 (×4): 2 g via INTRAVENOUS
  Filled 2020-07-26 (×2): qty 2
  Filled 2020-07-26: qty 20
  Filled 2020-07-26: qty 2

## 2020-07-26 MED ORDER — SODIUM CHLORIDE 0.9 % IV SOLN
500.0000 mg | INTRAVENOUS | Status: DC
  Administered 2020-07-27: 500 mg via INTRAVENOUS
  Filled 2020-07-26 (×2): qty 500

## 2020-07-26 MED ORDER — MIRTAZAPINE 15 MG PO TABS
15.0000 mg | ORAL_TABLET | Freq: Every evening | ORAL | Status: DC
  Administered 2020-07-27 – 2020-07-30 (×4): 15 mg via ORAL
  Filled 2020-07-26 (×4): qty 1

## 2020-07-26 MED ORDER — PANTOPRAZOLE SODIUM 40 MG PO TBEC
40.0000 mg | DELAYED_RELEASE_TABLET | Freq: Every day | ORAL | Status: DC
  Administered 2020-07-27 – 2020-07-30 (×4): 40 mg via ORAL
  Filled 2020-07-26 (×5): qty 1

## 2020-07-26 MED ORDER — MONTELUKAST SODIUM 10 MG PO TABS
10.0000 mg | ORAL_TABLET | Freq: Every evening | ORAL | Status: DC
  Administered 2020-07-27 – 2020-07-30 (×3): 10 mg via ORAL
  Filled 2020-07-26 (×3): qty 1

## 2020-07-26 MED ORDER — LORATADINE 10 MG PO TABS
10.0000 mg | ORAL_TABLET | Freq: Every day | ORAL | Status: DC
  Administered 2020-07-27 – 2020-07-31 (×5): 10 mg via ORAL
  Filled 2020-07-26 (×5): qty 1

## 2020-07-26 MED ORDER — SODIUM CHLORIDE 0.9 % IV SOLN
1.0000 g | Freq: Once | INTRAVENOUS | Status: AC
  Administered 2020-07-26: 1 g via INTRAVENOUS
  Filled 2020-07-26: qty 10

## 2020-07-26 MED ORDER — STERILE WATER FOR INJECTION IJ SOLN
INTRAMUSCULAR | Status: AC
  Administered 2020-07-26: 2.1 mL
  Filled 2020-07-26: qty 10

## 2020-07-26 MED ORDER — POLYETHYLENE GLYCOL 3350 17 G PO PACK: 17.0000 g | PACK | Freq: Every day | ORAL | Status: DC | PRN

## 2020-07-26 MED ORDER — OLANZAPINE 5 MG PO TBDP: 2.5000 mg | ORAL_TABLET | Freq: Two times a day (BID) | ORAL | Status: DC | PRN

## 2020-07-26 MED ORDER — BISACODYL 10 MG RE SUPP: 10.0000 mg | Freq: Every day | RECTAL | Status: DC | PRN

## 2020-07-26 MED ORDER — LACTATED RINGERS IV BOLUS
1000.0000 mL | Freq: Once | INTRAVENOUS | Status: AC
  Administered 2020-07-26: 1000 mL via INTRAVENOUS

## 2020-07-26 MED ORDER — ENOXAPARIN SODIUM 40 MG/0.4ML ~~LOC~~ SOLN
40.0000 mg | SUBCUTANEOUS | Status: DC
  Administered 2020-07-27 – 2020-07-30 (×4): 40 mg via SUBCUTANEOUS
  Filled 2020-07-26 (×4): qty 0.4

## 2020-07-26 MED ORDER — FLEET ENEMA 7-19 GM/118ML RE ENEM: 1.0000 | ENEMA | Freq: Once | RECTAL | Status: DC | PRN

## 2020-07-26 MED ORDER — ACETAMINOPHEN 650 MG RE SUPP: 650.0000 mg | Freq: Four times a day (QID) | RECTAL | Status: DC | PRN

## 2020-07-26 MED ORDER — LEVALBUTEROL HCL 0.63 MG/3ML IN NEBU
0.6300 mg | INHALATION_SOLUTION | Freq: Two times a day (BID) | RESPIRATORY_TRACT | Status: DC
  Filled 2020-07-26: qty 3

## 2020-07-26 MED ORDER — OLANZAPINE 5 MG PO TBDP
2.5000 mg | ORAL_TABLET | Freq: Every day | ORAL | Status: DC | PRN
  Administered 2020-07-27 – 2020-07-30 (×4): 2.5 mg via ORAL
  Filled 2020-07-26: qty 1
  Filled 2020-07-26 (×7): qty 0.5

## 2020-07-26 NOTE — ED Notes (Signed)
Attempted to call report to shannon on 6E. Nurse is busy at this time.

## 2020-07-26 NOTE — ED Notes (Signed)
Daughter in room at this time.

## 2020-07-26 NOTE — Telephone Encounter (Signed)
Called and spoke with the patient's daughter, she let me know that they never received the oxygen, she left with a portable tank at 5 pm on 07/25/20, the tank ran out of oxygen and they called 911 and she was sent to the hospital, she is now being admitted.  She went to the pharmacy and they received an albuterol inhaler which her mother cannot use, she needs the nebulizer solution.  They have the pulmicort nebulizer solution, but did not have a nebulizer machine.  I let her know I would place an order for the nebulizer machine (she stated she was told to get one on [facility] which she has received, but she can send it back).  I told her we would get it from [facility].  She said that [facility] is processing the oxygen orders.  [name], Please advise on albuterol solution, how often and what quantity and refills?  The prednisone has not been sent to the pharmacy since the patient is now being admitted to the hospital.  I did provide the CXR results to the daughter and she is receiving antibiotics in the hospital.  She is asking about a referral to palliative care through [facility].  She asked [place] to fax the referral to Orbie Pyo at [phone].  She is changing pcps and her Neurologist is trying to manage what he can, his name is Dr. York Cerise at [facility].  Please advise on Albuterol nebs and referral.  Thank you.

## 2020-07-26 NOTE — ED Notes (Signed)
Bed alarm placed under patient. 

## 2020-07-26 NOTE — ED Notes (Signed)
Pt screaming out for help from the room, upon entering room pt has completely removed clothing again and oxygen. Pt's pulse oximetry probe moved to toe and reading stayed between 93-97% on room air, will continue to monitor for any changes. Charge RN paged floor coverage Ardith Dark due to patients behaviors. Mitts placed on both arms to assist with keeping IV in place. Pt tolerating well

## 2020-07-26 NOTE — Telephone Encounter (Signed)
Messages reviewed. I am sorry that it was necessary, but agree with going to the ED as they did. Do not believe we would be able to troubleshoot these symptoms and changes by messaging.

## 2020-07-26 NOTE — Progress Notes (Signed)
Called and spoke with patient's daughter, provided results per Geraldo Pitter NP.  She verbalized understanding.

## 2020-07-26 NOTE — ED Notes (Signed)
Pt is very agitated and refuses to keep BP cuff on along with pulse ox.

## 2020-07-26 NOTE — H&P (Signed)
History and Physical    Melinda Griffin YNW:[id number] DOB: 06-26-1941 DOA: 07/26/2020  PCP: Seward Carol, MD   Patient coming from: Home  Chief Complaint: Confusion, Agitation, Hypoxia   HPI: Melinda Griffin is a 79 y.o. female with medical history significant of anxiety, history of asthma, history of hyperlipidemia, GERD, history of migraines as well as other comorbidities as well as advanced dementia who sees a neurologist at [facility] who presents with a chief complaint of worsening hypoxia, confusion, agitation.  Patient daughter is at bedside and patient is unable to provide a subjective history at this time given her current condition advanced dementia.  Daughter states that since September she is having multiple recurrent syncopal episodes of unclear etiology.  Syncope was to be worked up in the outpatient setting with her cardiologist and Dr. Eduard Clos ordered an echocardiogram as well as carotid Doppler which were still to be done and were to be done yesterday.  Patient was found to have worsening hypoxia and daughter monitors her oxygen saturation was found to be in the low 80s.  Patient was taken to her pulmonologist who referred her to receive home oxygen DME and have home health arranged.  Patient was discharged back from her pulmonary appointment with supplemental oxygen and was called in albuterol as well as budesonide and doxycycline for possible pneumonia.  Chest x-rays obtained yesterday and shows a possible viral process.  This morning the patient continued become very agitated and kept pulling off her supplemental oxygen via nasal cannula and when she continued be hypoxic EMS was called and EMS came and arrived and found the oxygen canister to be empty.  Patient was transported to [facility] for further evaluation given her hypoxia, confusion and agitation.  Patient has a known history of dementia and daughter states is progressively worsened in the last 4 months or so.   Patient's daughter does also state that she has a cough that is sometimes productive but most times it is not.  Patient has not had any wheezing but has been more confused and agitated and combative with the daughter.  Because of the constellation of symptoms [facility] was asked to admit this patient for acute respiratory failure with hypoxia in the setting of possible viral pneumonia and also because of her recurrent syncope of unknown etiology.  ED Course: In the ED patient had basic blood work done, a EKG done.  Chest x-ray done yesterday.  She was given a 1 L LR bolus and placed on empiric antibiotics with ceftriaxone and azithromycin.  Of note Covid testing was negative and patient received all 3 of her Covid vaccines.  Review of Systems: As per HPI otherwise all other systems reviewed and negative.   Past Medical History:  Diagnosis Date  . Allergic rhinitis   . Anxiety   . Asthma   . Hyperlipidemia   . Migraine with aura, without mention of intractable migraine without mention of status migrainosus     Past Surgical History:  Procedure Laterality Date  . APPENDECTOMY  1975  . BREAST BIOPSY Left 1976  . TUBAL LIGATION Bilateral 1976   SOCIAL HISTORY  reports that she has never smoked. She has never used smokeless tobacco. She reports that she does not drink alcohol and does not use drugs.  Allergies  Allergen Reactions  . 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 Aspirin     Advised by MD previously not to take  . Ceftin     Dizzy & Diarrhea  .  Ciprofloxacin Hcl     Dizzy, diarrhea   Family History  Problem Relation Age of Onset  . Asthma Father   . Asthma Sister   . Heart disease Sister   . Cancer Mother        brain, breast  . Cancer Brother 27  . Heart disease Brother   . Hypertension Brother    Prior to Admission medications   Medication Sig Start Date End Date Taking? Authorizing Provider  albuterol (VENTOLIN HFA) 108 (90 Base) MCG/ACT inhaler as needed. 02/21/20    [provider]  budesonide (PULMICORT) 0.25 MG/2ML nebulizer solution Take 2 mLs (0.25 mg total) by nebulization 2 (two) times daily. Partial refill ok 07/25/20   Martyn Ehrich, NP  Calcium Carb-Cholecalciferol (CALCIUM 500 + D) 500-200 MG-UNIT TABS Take 1 tablet by mouth daily.    [provider]  cetirizine (ZYRTEC) 10 MG tablet Take 10 mg by mouth daily. Take one tablet once daily for allergies     [provider]  citalopram (CELEXA) 40 MG tablet TAKE 1 TABLET BY MOUTH ONCE DAILY Patient taking differently: Take 40 mg by mouth daily.  05/04/18   Reed, Tiffany L, DO  denosumab (PROLIA) 60 MG/ML SOSY injection Inject 60 mg into the skin every 6 (six) months.    [provider]  doxycycline (VIBRA-TABS) 100 MG tablet Take 1 tablet (100 mg total) by mouth 2 (two) times daily. 07/25/20   Martyn Ehrich, NP  lovastatin (MEVACOR) 40 MG tablet TAKE 1 TABLET BY MOUTH EVERY DAY TO LOWER CHOLESTEROL Patient taking differently: Take 40 mg by mouth every evening.  07/03/18   Reed, Tiffany L, DO  mirtazapine (REMERON) 15 MG tablet Take 15 mg by mouth every evening.  02/24/20   [provider]  montelukast (SINGULAIR) 10 MG tablet TAKE 1 TABLET BY MOUTH EVERY DAY Patient taking differently: Take 10 mg by mouth every evening.  12/01/18   Reed, Tiffany L, DO  omeprazole (PRILOSEC) 10 MG capsule Take 10 mg by mouth in the morning and at bedtime.    [provider]   Physical Exam: Vitals:   07/26/20 1357 07/26/20 1403 07/26/20 1500 07/26/20 1600  BP:  117/66 117/66 (!) 119/58  Pulse:  85 79 74  Resp:  (!) 28 (!) 25 (!) 25  Temp:  97.8 F (36.6 C)    TempSrc:  Oral    SpO2:  96% 97% 95%  Weight: 58.5 kg     Height: 5\' 1"  (1.549 m)      Constitutional: WN/WD elderly pleasantly confused and demented Caucasian female currently in NAD and appears calm and comfortable Eyes: Lids and conjunctivae normal, sclerae anicteric  ENMT: External Ears, Nose  appear normal. Grossly normal hearing. Neck: Appears normal, supple, no cervical masses, normal ROM, no appreciable thyromegaly; no JVD Respiratory: Diminished to auscultation bilaterally, no wheezing, rales, rhonchi or crackles. Normal respiratory effort and patient is not tachypenic. No accessory muscle use.  Patient wearing supplemental oxygen via nasal cannula Cardiovascular: RRR, no murmurs / rubs / gallops. S1 and S2 auscultated. No extremity edema. Abdomen: Soft, non-tender, non-distended.  Bowel sounds positive.  GU: Deferred. Musculoskeletal: No clubbing / cyanosis of digits/nails. No joint deformity upper and lower extremities. Good ROM, no contractures. Normal strength and muscle tone.  Skin: No rashes, lesions, ulcers on limited skin evaluation. No induration; Warm and dry.  Neurologic: CN 2-12 grossly intact with no focal deficits. Romberg sign cerebellar reflexes not assessed.  Psychiatric: Impaired  judgment and insight.  She is awake and alert but not oriented x 3.  Pleasantly demented mood and appropriate affect.   Labs on Admission: I have personally reviewed following labs and imaging studies  CBC: Recent Labs  Lab 07/26/20 1540  WBC 9.9  NEUTROABS 5.5  HGB 13.5  HCT 39.0  MCV 90.3  PLT 654   Basic Metabolic Panel: Recent Labs  Lab 07/26/20 1540  NA 140  K 3.8  CL 103  CO2 24  GLUCOSE 112*  BUN 19  CREATININE 0.74  CALCIUM 9.2   GFR: Estimated Creatinine Clearance: 45.4 mL/min (by C-G formula based on SCr of 0.74 mg/dL). Liver Function Tests: Recent Labs  Lab 07/26/20 1540  AST 18  ALT 16  ALKPHOS 69  BILITOT 0.7  PROT 8.1  ALBUMIN 3.5   No results for input(s): LIPASE, AMYLASE in the last 168 hours. No results for input(s): AMMONIA in the last 168 hours. Coagulation Profile: No results for input(s): INR, PROTIME in the last 168 hours. Cardiac Enzymes: No results for input(s): CKTOTAL, CKMB, CKMBINDEX, TROPONINI in the last 168 hours. BNP  (last 3 results) No results for input(s): PROBNP in the last 8760 hours. HbA1C: No results for input(s): HGBA1C in the last 72 hours. CBG: No results for input(s): GLUCAP in the last 168 hours. Lipid Profile: No results for input(s): CHOL, HDL, LDLCALC, TRIG, CHOLHDL, LDLDIRECT in the last 72 hours. Thyroid Function Tests: No results for input(s): TSH, T4TOTAL, FREET4, T3FREE, THYROIDAB in the last 72 hours. Anemia Panel: No results for input(s): VITAMINB12, FOLATE, FERRITIN, TIBC, IRON, RETICCTPCT in the last 72 hours. Urine analysis:    Component Value Date/Time   COLORURINE YELLOW 07/05/2020 0310   APPEARANCEUR CLOUDY (A) 07/05/2020 0310   LABSPEC 1.008 07/05/2020 0310   PHURINE 8.0 07/05/2020 0310   GLUCOSEU NEGATIVE 07/05/2020 0310   HGBUR SMALL (A) 07/05/2020 0310   BILIRUBINUR NEGATIVE 07/05/2020 0310   KETONESUR NEGATIVE 07/05/2020 0310   PROTEINUR 30 (A) 07/05/2020 0310   NITRITE NEGATIVE 07/05/2020 0310   LEUKOCYTESUR LARGE (A) 07/05/2020 0310   Sepsis Labs: !!!!!!!!!!!!!!!!!!!!!!!!!!!!!!!!!!!!!!!!!!!! @LABRCNTIP (procalcitonin:4,lacticidven:4) ) Recent Results (from the past 240 hour(s))  Resp Panel by RT-PCR (Flu A&B, Covid) Nasopharyngeal Swab     Status: None   Collection Time: 07/26/20  3:48 PM   Specimen: Nasopharyngeal Swab; Nasopharyngeal(NP) swabs in vial transport medium  Result Value Ref Range Status   SARS Coronavirus 2 by RT PCR NEGATIVE NEGATIVE Final    Comment: (NOTE) SARS-CoV-2 target nucleic acids are NOT DETECTED.  The SARS-CoV-2 RNA is generally detectable in upper respiratory specimens during the acute phase of infection. The lowest concentration of SARS-CoV-2 viral copies this assay can detect is 138 copies/mL. A negative result does not preclude SARS-Cov-2 infection and should not be used as the sole basis for treatment or other patient management decisions. A negative result may occur with  improper specimen collection/handling, submission  of specimen other than nasopharyngeal swab, presence of viral mutation(s) within the areas targeted by this assay, and inadequate number of viral copies(<138 copies/mL). A negative result must be combined with clinical observations, patient history, and epidemiological information. The expected result is Negative.  Fact Sheet for Patients:  EntrepreneurPulse.com.au  Fact Sheet for Healthcare Providers:  IncredibleEmployment.be  This test is no t yet approved or cleared by the [place] FDA and  has been authorized for detection and/or diagnosis of SARS-CoV-2 by FDA under an Emergency Use Authorization (EUA). This EUA will remain  in  effect (meaning this test can be used) for the duration of the COVID-19 declaration under Section 564(b)(1) of the Act, 21 U.S.C.section 360bbb-3(b)(1), unless the authorization is terminated  or revoked sooner.       Influenza A by PCR NEGATIVE NEGATIVE Final   Influenza B by PCR NEGATIVE NEGATIVE Final    Comment: (NOTE) The Xpert Xpress SARS-CoV-2/FLU/RSV plus assay is intended as an aid in the diagnosis of influenza from Nasopharyngeal swab specimens and should not be used as a sole basis for treatment. Nasal washings and aspirates are unacceptable for Xpert Xpress SARS-CoV-2/FLU/RSV testing.  Fact Sheet for Patients: EntrepreneurPulse.com.au  Fact Sheet for Healthcare Providers: IncredibleEmployment.be  This test is not yet approved or cleared by the [place] FDA and has been authorized for detection and/or diagnosis of SARS-CoV-2 by FDA under an Emergency Use Authorization (EUA). This EUA will remain in effect (meaning this test can be used) for the duration of the COVID-19 declaration under Section 564(b)(1) of the Act, 21 U.S.C. section 360bbb-3(b)(1), unless the authorization is terminated or revoked.  Performed at [facility], [place]  [address], [place], [place] [address]      Radiological Exams on Admission: DG Chest 2 View  Result Date: 07/25/2020 CLINICAL DATA:  Shortness of breath. Acute respiratory failure with hypoxia. EXAM: CHEST - 2 VIEW COMPARISON:  06/14/2020 FINDINGS: The heart size and mediastinal contours are within normal limits. Increased prominence of interstitial lung markings is seen throughout both lungs, suspicious for viral or other atypical pneumonitis; interstitial edema is considered less likely. No evidence pulmonary consolidation or pleural effusion. IMPRESSION: Increased diffuse interstitial prominence, suspicious for viral or other atypical pneumonitis, with interstitial edema considered less likely. Electronically Signed   By: Marlaine Hind M.D.   On: 07/25/2020 18:03    EKG: Independently reviewed. Showed a Sinus Rhythm of 80, artifact and a QTc of 463.  Assessment/Plan Active Problems:   Acute respiratory failure (HCC)  Acute Respiratory Failure with Hypoxia with ? Viral CAP poA vs Viral Pneumonitis -Place in Obs Telemetry -Saw Pulmonology yesterday and they obtained CXR on 11/30  -CXR showed "The heart size and mediastinal contours are within normal limits. Increased prominence of interstitial lung markings is seen throughout both lungs, suspicious for viral or other atypical pneumonitis; interstitial edema is considered less likely. No evidence pulmonary consolidation or pleural effusion." -Order for DME New O2 initiated yesterday but Home Health Arrived with an Empty O2 Cannister and [place] never showed today -Will Treat CAP Pneumonia -Check SLP to ensure patient is not aspirating -WBC was 9.9 and Lactic Acid was 0.9 -Will continue Empiric Abx for CAP coverage with Azithromycin and Ceftriaxone until we obtain a PCT; Was given Doxycycline by Pulmonary yesterday -Check Respiratory Virus Panel and Empiric Droplet Precautions -SpO2: 98 % O2 Flow Rate (L/min): 2 L/min -Continuous Pulse  Oximetry and Maintain O2 Saturations >90% -[place]ue Supplemental O2 via [place] and wean O2 as tolerated -Singular and Breo-Ellipta were stopped at Last visit -Will start Budesonide 0.25 mg BID and Start Xopenex/Atrovent; Hold Albuterol Inhaler as patient had difficulty comprehending how to use it -If Hypoxia is not improving consider CTA of the Chest to r/o PE and Possible Pulmonary Consultation -Guaifenesin, Flutter Valve, and Incentive Spirometry -Patient sees Dr. Lamonte Sakai as an outpatient  -TOC Consulted for assistance with Home Health  Agitation and Worsened Encephalopathy in the setting of Hypoxia superimposed on Chronic Dementia -Given a Liter of LR in the ED  -Monitor overnight -Check U/A  and Urine Cx as well as Blood Cx to r/o UTI and/or Bacteremia  -Gentle IVF hydration with NS 75 mL/hr x 1 Day  -Treat possible PNA with Abx as above  -If Encephalopathy Persists will obtain Head CT imaging; Recent Head CT Done 3 weeks ago and showed "No acute intracranial findings.Chronic microvascular angiopathy and parenchymal volume loss" -Could also be progression of Dementia -Will consult Palliative Care for Further GOC Discussion -Check TSH, RPR, B12  -Delirium Precautions -C/w Mirtazapine 15 mg po qHS and Citalopram 40 mg po Daily  -WILL NOT TRY ATIVAN or HALDOL; If patient becomes agitated will try Olanzapine 2.5 mg po DailyPRN and Soft Mitten Restraints  Recurrent Syncope -Unclear Etiology but has been happening since September  -C/w Telemetry Monitoring -Troponin Flat at 3 x2  -Check ECHOCardiogram -Recent Head CT Done for Syncopal Episode Nov 9th -Check Orthostatic VS -C/w Gentle IVF Hydration with NS at 67mL/hr -Check D-Dimer and if warranted CTA of Chest; She has a new Hypoxia and ? Related to a PE given that shes had a Hx of Recurrent Syncope -Sees Cardiology Dr. Margaretann Loveless as an outpatient   GERD -C/w Omeprazole 10 mg po BID substitution with Pantoprazole   HLD -C/w Lovastatin 40  mg po qHS  Hyperglycemia -Patient's Blood Sugar was 112 -Continue to Monitor Blood Sugars Carefully   GOC: DNR, poA -Has Advanced Directives -Daughter requests to speak with Palliative Care so will consult   DVT prophylaxis: Enoxaparin 40 mg sq q24h Code Status: DO NOT RESUSCITATE/DO NOT INTUBATE  Family Communication: Discussed with Daughter [name] extensively at Bedside  Disposition Plan: Anticipating D/C in the next 24-48 hours Consults called: None Admission status: Obs Telemetry   Severity of Illness: The appropriate patient status for this patient is OBSERVATION. Observation status is judged to be reasonable and necessary in order to provide the required intensity of service to ensure the patient's safety. The patient's presenting symptoms, physical exam findings, and initial radiographic and laboratory data in the context of their medical condition is felt to place them at decreased risk for further clinical deterioration. Furthermore, it is anticipated that the patient will be medically stable for discharge from the hospital within 2 midnights of admission. The following factors support the patient status of observation.   " The patient's presenting symptoms include Agitation, Hypoxia, Worsened Confusion. " The physical exam findings include indicative of mild dehydration. " The initial radiographic is concerning for a viral process but labs are relatively unremarkable   Status is: Observation  The patient remains OBS appropriate and will d/c before 2 midnights.  Dispo: The patient is from: Home              Anticipated d/c is to: Home              Anticipated d/c date is: 1-2 days              Patient currently is not medically stable to d/c.  Kerney Elbe, D.O. [facility] PAGER is on [place]  If 7PM-7AM, please contact night-coverage [email or web address]  07/26/2020, 5:13 PM

## 2020-07-26 NOTE — ED Notes (Signed)
Patient agitated and pulling off all monitoring, oxygen, and refusing medications. Floor coverage paged.

## 2020-07-26 NOTE — Progress Notes (Signed)
Please let patient know CXR showed viral pneumonitis. Can you please send in prednisone 20mg  daily x 5 days.

## 2020-07-26 NOTE — ED Notes (Signed)
Upon entering room patient had completely removed monitor cords and IV, catheter intact. Replaced IV and placed back on monitor and oxygen. Offered pt oral PRN meds but patient refused. Pt repeatedly stating she wants her clothes and shoes. Offered pt something to drink as well as paper scrubs as opposed to the gown, again pt refused.

## 2020-07-26 NOTE — Telephone Encounter (Signed)
Dr. Lamonte Sakai this is in addition to the previous message please advise  Just wanted you to know 1) the steroid based solution Rx has not shown up at [facility]. The doxy and albuterol was picked up and started at dinner. 2) mom is talking out of her head and will not leave her cannula on. If her disorientation continues tomorrow I will call and see if you think she needs to go to ED. Thanks, zan

## 2020-07-26 NOTE — Telephone Encounter (Signed)
[name] called and left telephone msg as well and I have called her back and LMTCB

## 2020-07-26 NOTE — ED Provider Notes (Signed)
[facility] DEPT Provider Note   CSN: [id number] Arrival date & time: 07/26/20  1346     History Chief Complaint  Patient presents with  . Aggressive Behavior    Alyrica Hathcock Starr is a 79 y.o. female.  Patient is an 79 year old female with a history of dementia, asthma, hyperlipidemia, declining cognitive status who lives with her daughter and is presenting today due to worsening mental status, lethargy and agitation.  Daughter reports on Friday patient started becoming very lethargic, decreased oral intake and did not want to do anything.  She has had an ongoing cough that is been nonproductive.  She has felt warm at home but has not had an oral temperature.  This continued throughout the weekend and daughter was monitoring her oxygen and it was remaining in the low to mid 80s but would improve when she made her get up and cough.  She continued to push fluids and was able to get her to drink a little bit and Monday she was feeling a little bit better.  Daughter reports she ate and drank a little bit more and took a bath.  However the cough continues and they went and saw their pulmonologist yesterday.  At the office she had an x-ray and she was noted to have low oxygen saturation and went home with oxygen from the office.  Today daughter reports that she is continue to give patient the nebulizers.  She has had 2 doses of doxycycline due to presumed pneumonia but she has become more lethargic and agitated today and daughter did not feel that she was safe at home.  Also home health has not come to the house yet and she ran out of oxygen.  Patient has received all 3 Covid vaccines.  She gets tested regularly and they have all been negative.  She has not had any vomiting, diarrhea or complaint of chest or abdominal pain.  She has not complained of dysuria.  The history is provided by a caregiver.       Past Medical History:  Diagnosis Date  . Allergic  rhinitis   . Anxiety   . Asthma   . Hyperlipidemia   . Migraine with aura, without mention of intractable migraine without mention of status migrainosus     Patient Active Problem List   Diagnosis Date Noted  . Acute respiratory failure ([place]) 07/25/2020  . 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 Dysphagia 05/26/2020  . Community acquired pneumonia of right lower lobe of lung   . Acute metabolic encephalopathy 54/27/0623  . Weight loss, non-intentional 12/08/2017  . Relationship problem between partners 04/14/2017  . 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 H/O migraine 05/11/2015  . Dupuytren's contracture 07/13/2014  . Encounter for therapeutic drug monitoring 12/31/2013  . Hyperlipidemia 11/29/2013  . Anxiety 11/29/2013  . Unspecified vitamin D deficiency 11/30/2012  . Depression 11/30/2012  . Mild cognitive impairment with memory loss 11/30/2012  . Hyperglycemia 11/30/2012  . Allergic rhinitis 05/09/2010    Past Surgical History:  Procedure Laterality Date  . APPENDECTOMY  1975  . BREAST BIOPSY Left 1976  . TUBAL LIGATION Bilateral 1976     OB History   No obstetric history on file.     Family History  Problem Relation Age of Onset  . Asthma Father   . Asthma Sister   . Heart disease Sister   . Cancer Mother        brain, breast  . Cancer Brother 46  .  Heart disease Brother   . Hypertension Brother     Social History   Tobacco Use  . Smoking status: Never Smoker  . Smokeless tobacco: Never Used  . Tobacco comment: smoked  briefly in early 60's less than a year  Vaping Use  . Vaping Use: Never used  Substance Use Topics  . Alcohol use: No    Alcohol/week: 0.0 standard drinks  . Drug use: No    Home Medications Prior to Admission medications   Medication Sig Start Date End Date Taking? Authorizing Provider  albuterol (VENTOLIN HFA) 108 (90 Base) MCG/ACT inhaler as needed. 02/21/20   [provider]  budesonide (PULMICORT) 0.25 MG/2ML  nebulizer solution Take 2 mLs (0.25 mg total) by nebulization 2 (two) times daily. Partial refill ok 07/25/20   Martyn Ehrich, NP  Calcium Carb-Cholecalciferol (CALCIUM 500 + D) 500-200 MG-UNIT TABS Take 1 tablet by mouth daily.    [provider]  cetirizine (ZYRTEC) 10 MG tablet Take 10 mg by mouth daily. Take one tablet once daily for allergies     [provider]  citalopram (CELEXA) 40 MG tablet TAKE 1 TABLET BY MOUTH ONCE DAILY Patient taking differently: Take 40 mg by mouth daily.  05/04/18   Reed, Tiffany L, DO  denosumab (PROLIA) 60 MG/ML SOSY injection Inject 60 mg into the skin every 6 (six) months.    [provider]  doxycycline (VIBRA-TABS) 100 MG tablet Take 1 tablet (100 mg total) by mouth 2 (two) times daily. 07/25/20   Martyn Ehrich, NP  lovastatin (MEVACOR) 40 MG tablet TAKE 1 TABLET BY MOUTH EVERY DAY TO LOWER CHOLESTEROL Patient taking differently: Take 40 mg by mouth every evening.  07/03/18   Reed, Tiffany L, DO  mirtazapine (REMERON) 15 MG tablet Take 15 mg by mouth every evening.  02/24/20   [provider]  montelukast (SINGULAIR) 10 MG tablet TAKE 1 TABLET BY MOUTH EVERY DAY Patient taking differently: Take 10 mg by mouth every evening.  12/01/18   Reed, Tiffany L, DO  omeprazole (PRILOSEC) 10 MG capsule Take 10 mg by mouth in the morning and at bedtime.    [provider]    Allergies    Shrimp [shellfish allergy], Aspirin, Ceftin, and Ciprofloxacin hcl  Review of Systems   Review of Systems  All other systems reviewed and are negative.   Physical Exam Updated Vital Signs BP 117/66   Pulse 79   Temp 97.8 F (36.6 C) (Oral)   Resp (!) 25   Ht 5\' 1"  (1.549 m)   Wt 58.5 kg   SpO2 97%   BMI 24.37 kg/m   Physical Exam Vitals and nursing note reviewed.  Constitutional:      General: She is not in acute distress.    Appearance: Normal appearance. She is well-developed and normal weight.  HENT:     Head:  Normocephalic and atraumatic.     Mouth/Throat:     Mouth: Mucous membranes are dry.  Eyes:     Pupils: Pupils are equal, round, and reactive to light.  Cardiovascular:     Rate and Rhythm: Normal rate and regular rhythm.     Heart sounds: Normal heart sounds. No murmur heard.  No friction rub.  Pulmonary:     Effort: Pulmonary effort is normal. Tachypnea present.     Breath sounds: Normal breath sounds. No wheezing or rales.     Comments: Fine crackles noted in bilateral lower lobes.  Good air  movement no wheezing Abdominal:     General: Bowel sounds are normal. There is no distension.     Palpations: Abdomen is soft.     Tenderness: There is no abdominal tenderness. There is no guarding or rebound.  Musculoskeletal:        General: No tenderness. Normal range of motion.     Comments: No edema  Skin:    General: Skin is warm and dry.     Findings: No rash.  Neurological:     Mental Status: She is alert.     Cranial Nerves: No cranial nerve deficit.     Comments: Oriented to person.  Moves all extremities.  Psychiatric:     Comments: Calm and cooperative at this time     ED Results / Procedures / Treatments   Labs (all labs ordered are listed, but only abnormal results are displayed) Labs Reviewed  CBC WITH DIFFERENTIAL/PLATELET - Abnormal; Notable for the following components:      Result Value   Monocytes Absolute 1.2 (*)    Eosinophils Absolute 0.9 (*)    All other components within normal limits  COMPREHENSIVE METABOLIC PANEL - Abnormal; Notable for the following components:   Glucose, Bld 112 (*)    All other components within normal limits  BLOOD GAS, VENOUS - Abnormal; Notable for the following components:   pCO2, Ven 38.7 (*)    pO2, Ven 174.0 (*)    All other components within normal limits  RESP PANEL BY RT-PCR (FLU A&B, COVID) [id number]  URINE CULTURE  LACTIC ACID, PLASMA  URINALYSIS, ROUTINE W REFLEX MICROSCOPIC  TROPONIN I (HIGH SENSITIVITY)  TROPONIN I  (HIGH SENSITIVITY)    EKG EKG Interpretation  Date/Time:  Wednesday July 26 2020 15:41:04 EST Ventricular Rate:  80 PR Interval:    QRS Duration: 103 QT Interval:  401 QTC Calculation: 463 R Axis:   -24 Text Interpretation: Sinus rhythm Borderline left axis deviation ST elevation, consider inferior injury Artifact Confirmed by Blanchie Dessert [phone]) on 07/26/2020 4:00:44 PM   Radiology DG Chest 2 View  Result Date: 07/25/2020 CLINICAL DATA:  Shortness of breath. Acute respiratory failure with hypoxia. EXAM: CHEST - 2 VIEW COMPARISON:  06/14/2020 FINDINGS: The heart size and mediastinal contours are within normal limits. Increased prominence of interstitial lung markings is seen throughout both lungs, suspicious for viral or other atypical pneumonitis; interstitial edema is considered less likely. No evidence pulmonary consolidation or pleural effusion. IMPRESSION: Increased diffuse interstitial prominence, suspicious for viral or other atypical pneumonitis, with interstitial edema considered less likely. Electronically Signed   By: Marlaine Hind M.D.   On: 07/25/2020 18:03    Procedures Procedures (including critical care time)  Medications Ordered in ED Medications  lactated ringers bolus 1,000 mL (1,000 mLs Intravenous New Bag/Given 07/26/20 1544)    ED Course  I have reviewed the triage vital signs and the nursing notes.  Pertinent labs & imaging results that were available during my care of the patient were reviewed by me and considered in my medical decision making (see chart for details).    MDM Rules/Calculators/A&P                          Elderly female presenting today with worsening URI symptoms over the last 6 days that is been waxing and waning but continues to worsen.  She went home from pulmonology office yesterday with oxygen due to hypoxia and presumed pneumonia.  She was also  started on doxycycline, Pulmicort and albuterol nebs.  Daughter reports the  Pulmicort never arrived at the pharmacy.  She has received 2 doses of doxycycline.  She continues to use the nebulizer from her grandson because home health has not arrived at the house and did not bring more oxygen.  Today she felt her mother was more combative and was just not doing well and pulmonology encouraged her to go to the hospital.  Patient is currently on 2 L satting 97% and looks comfortable.  She is having no wheezing or sign of asthma exacerbation at this time.  X-ray showed yesterday does show an atypical pneumonitis versus viral infection.  Patient does feel warm to the touch but oral temperature is within normal limits.  Low suspicion for abdominal issues and screening each EKG within normal limits.  Will give IV fluids, labs are pending.  Plan will be to treat for CAP, Covid testing and admit.  4:59 PM CBC, CMP, VBG and lactate are reassuring.  Given hypoxia, new oxygen requirement and unable to get home health out to get more oxygen as well as still not getting all the meds at pharmacy and pt refusing po's and becoming combative will admit for IV abx, fluids and monitor for improvement before d/c.  MDM Number of Diagnoses or Management Options   Amount and/or Complexity of Data Reviewed Clinical lab tests: ordered and reviewed Tests in the radiology section of CPT: reviewed Tests in the medicine section of CPT: ordered and reviewed Decide to obtain previous medical records or to obtain history from someone other than the patient: yes Obtain history from someone other than the patient: yes Review and summarize past medical records: yes Discuss the patient with other providers: yes Independent visualization of images, tracings, or specimens: yes  Risk of Complications, Morbidity, and/or Mortality Presenting problems: high Diagnostic procedures: low Management options: low  Patient Progress Patient progress: stable   Final Clinical Impression(s) / ED Diagnoses Final  diagnoses:  Community acquired pneumonia, unspecified laterality  Acute respiratory failure with hypoxia ([place])    Rx / DC Orders ED Discharge Orders    None       Blanchie Dessert, MD 07/26/20 1701

## 2020-07-26 NOTE — ED Triage Notes (Signed)
Pt arrives via EMS from home. Daughter states patient is combative. Hx of dementia. EMS arrives to empty oxygen tank, home oxygen 2lpm via [place]. Patient 90% on RA. 2lpm [place] applied by ems.

## 2020-07-26 NOTE — Progress Notes (Signed)
Patient is very agitated and refuses to be assessed or take a breathing treatment. She is demanding to go home and that someone get her clothes and her shoes so she can join her two children and her husband. Bilateral hand mitts placed prior to my arrival. However, when attempting to assess and auscultate, she persistently swats her hands and complains that she has been here already for "five hours". RN made aware of patient refusal.

## 2020-07-26 NOTE — ED Notes (Signed)
Pt brought up to inpatient room with RN and HCT, placed back on 2L of oxygen and repositioned in bed. Updated accepting RN on patient status and plan of care.

## 2020-07-26 NOTE — Telephone Encounter (Addendum)
The budesonide was sent to pharm by [facility] on 07/25/20 [employer] confirmed order for o2 07/26/20 at 4 pm  Does she not have a concentrator at home already?  Called [name] and had to [facility]- hold to call again soon and rerouted to triage marked urgent

## 2020-07-27 ENCOUNTER — Observation Stay (HOSPITAL_COMMUNITY): Payer: Medicare Other

## 2020-07-27 ENCOUNTER — Encounter (HOSPITAL_COMMUNITY): Payer: Self-pay

## 2020-07-27 ENCOUNTER — Observation Stay (HOSPITAL_BASED_OUTPATIENT_CLINIC_OR_DEPARTMENT_OTHER): Payer: Medicare Other

## 2020-07-27 ENCOUNTER — Other Ambulatory Visit (HOSPITAL_COMMUNITY): Payer: Medicare Other

## 2020-07-27 DIAGNOSIS — Z881 Allergy status to other antibiotic agents status: Secondary | ICD-10-CM | POA: Diagnosis not present

## 2020-07-27 DIAGNOSIS — F419 Anxiety disorder, unspecified: Secondary | ICD-10-CM | POA: Diagnosis present

## 2020-07-27 DIAGNOSIS — J9601 Acute respiratory failure with hypoxia: Secondary | ICD-10-CM | POA: Diagnosis present

## 2020-07-27 DIAGNOSIS — Z87892 Personal history of anaphylaxis: Secondary | ICD-10-CM | POA: Diagnosis not present

## 2020-07-27 DIAGNOSIS — J45909 Unspecified asthma, uncomplicated: Secondary | ICD-10-CM | POA: Diagnosis present

## 2020-07-27 DIAGNOSIS — F0281 Dementia in other diseases classified elsewhere with behavioral disturbance: Secondary | ICD-10-CM

## 2020-07-27 DIAGNOSIS — E860 Dehydration: Secondary | ICD-10-CM | POA: Diagnosis present

## 2020-07-27 DIAGNOSIS — Z7951 Long term (current) use of inhaled steroids: Secondary | ICD-10-CM | POA: Diagnosis not present

## 2020-07-27 DIAGNOSIS — E872 Acidosis: Secondary | ICD-10-CM | POA: Diagnosis not present

## 2020-07-27 DIAGNOSIS — G20 Parkinson's disease: Secondary | ICD-10-CM | POA: Diagnosis not present

## 2020-07-27 DIAGNOSIS — E785 Hyperlipidemia, unspecified: Secondary | ICD-10-CM | POA: Diagnosis present

## 2020-07-27 DIAGNOSIS — F0390 Unspecified dementia without behavioral disturbance: Secondary | ICD-10-CM | POA: Diagnosis present

## 2020-07-27 DIAGNOSIS — R739 Hyperglycemia, unspecified: Secondary | ICD-10-CM | POA: Diagnosis present

## 2020-07-27 DIAGNOSIS — Z66 Do not resuscitate: Secondary | ICD-10-CM | POA: Diagnosis present

## 2020-07-27 DIAGNOSIS — Z515 Encounter for palliative care: Secondary | ICD-10-CM

## 2020-07-27 DIAGNOSIS — E440 Moderate protein-calorie malnutrition: Secondary | ICD-10-CM | POA: Diagnosis present

## 2020-07-27 DIAGNOSIS — R7989 Other specified abnormal findings of blood chemistry: Secondary | ICD-10-CM | POA: Diagnosis not present

## 2020-07-27 DIAGNOSIS — Z79899 Other long term (current) drug therapy: Secondary | ICD-10-CM | POA: Diagnosis not present

## 2020-07-27 DIAGNOSIS — D72829 Elevated white blood cell count, unspecified: Secondary | ICD-10-CM | POA: Diagnosis not present

## 2020-07-27 DIAGNOSIS — R55 Syncope and collapse: Secondary | ICD-10-CM

## 2020-07-27 DIAGNOSIS — G9340 Encephalopathy, unspecified: Secondary | ICD-10-CM | POA: Diagnosis present

## 2020-07-27 DIAGNOSIS — J189 Pneumonia, unspecified organism: Principal | ICD-10-CM

## 2020-07-27 DIAGNOSIS — Z20822 Contact with and (suspected) exposure to covid-19: Secondary | ICD-10-CM | POA: Diagnosis present

## 2020-07-27 DIAGNOSIS — Z886 Allergy status to analgesic agent status: Secondary | ICD-10-CM | POA: Diagnosis not present

## 2020-07-27 DIAGNOSIS — K219 Gastro-esophageal reflux disease without esophagitis: Secondary | ICD-10-CM | POA: Diagnosis present

## 2020-07-27 DIAGNOSIS — J129 Viral pneumonia, unspecified: Secondary | ICD-10-CM | POA: Diagnosis not present

## 2020-07-27 DIAGNOSIS — Z781 Physical restraint status: Secondary | ICD-10-CM | POA: Diagnosis not present

## 2020-07-27 DIAGNOSIS — Z7189 Other specified counseling: Secondary | ICD-10-CM | POA: Diagnosis not present

## 2020-07-27 DIAGNOSIS — Z79890 Hormone replacement therapy (postmenopausal): Secondary | ICD-10-CM | POA: Diagnosis not present

## 2020-07-27 LAB — ECHOCARDIOGRAM COMPLETE
Area-P 1/2: 4.06 cm2
Calc EF: 59.4 %
Height: 61 in
S' Lateral: 2.57 cm
Single Plane A2C EF: 62.9 %
Single Plane A4C EF: 59 %
Weight: 1940.05 oz

## 2020-07-27 LAB — CBC
HCT: 35.9 % — ABNORMAL LOW (ref 36.0–46.0)
Hemoglobin: 12.1 g/dL (ref 12.0–15.0)
MCH: 31.2 pg (ref 26.0–34.0)
MCHC: 33.7 g/dL (ref 30.0–36.0)
MCV: 92.5 fL (ref 80.0–100.0)
Platelets: 295 10*3/uL (ref 150–400)
RBC: 3.88 MIL/uL (ref 3.87–5.11)
RDW: 13.1 % (ref 11.5–15.5)
WBC: 11.5 10*3/uL — ABNORMAL HIGH (ref 4.0–10.5)
nRBC: 0 % (ref 0.0–0.2)

## 2020-07-27 LAB — COMPREHENSIVE METABOLIC PANEL
ALT: 16 U/L (ref 0–44)
AST: 20 U/L (ref 15–41)
Albumin: 3 g/dL — ABNORMAL LOW (ref 3.5–5.0)
Alkaline Phosphatase: 55 U/L (ref 38–126)
Anion gap: 12 (ref 5–15)
BUN: 14 mg/dL (ref 8–23)
CO2: 21 mmol/L — ABNORMAL LOW (ref 22–32)
Calcium: 8.4 mg/dL — ABNORMAL LOW (ref 8.9–10.3)
Chloride: 106 mmol/L (ref 98–111)
Creatinine, Ser: 0.66 mg/dL (ref 0.44–1.00)
GFR, Estimated: >60 mL/min (ref >60)
Glucose, Bld: 98 mg/dL (ref 70–99)
Potassium: 3.5 mmol/L (ref 3.5–5.1)
Sodium: 139 mmol/L (ref 135–145)
Total Bilirubin: 0.5 mg/dL (ref 0.3–1.2)
Total Protein: 6.7 g/dL (ref 6.5–8.1)

## 2020-07-27 LAB — TSH: TSH: 0.407 u[IU]/mL (ref 0.350–4.500)

## 2020-07-27 LAB — VITAMIN B12: Vitamin B-12: 3633 pg/mL — ABNORMAL HIGH (ref 180–914)

## 2020-07-27 LAB — PROCALCITONIN: Procalcitonin: <0.1 ng/mL

## 2020-07-27 LAB — GLUCOSE, CAPILLARY: Glucose-Capillary: 102 mg/dL — ABNORMAL HIGH (ref 70–99)

## 2020-07-27 LAB — PHOSPHORUS: Phosphorus: 2.6 mg/dL (ref 2.5–4.6)

## 2020-07-27 LAB — MAGNESIUM: Magnesium: 1.7 mg/dL (ref 1.7–2.4)

## 2020-07-27 LAB — RPR: RPR Ser Ql: NONREACTIVE

## 2020-07-27 MED ORDER — IOHEXOL 350 MG/ML SOLN
80.0000 mL | Freq: Once | INTRAVENOUS | Status: AC | PRN
  Administered 2020-07-27: 80 mL via INTRAVENOUS

## 2020-07-27 MED ORDER — PREDNISONE 50 MG PO TABS: 50.0000 mg | ORAL_TABLET | Freq: Four times a day (QID) | ORAL | Status: DC

## 2020-07-27 MED ORDER — ALBUTEROL SULFATE (2.5 MG/3ML) 0.083% IN NEBU: 2.5000 mg | INHALATION_SOLUTION | Freq: Four times a day (QID) | RESPIRATORY_TRACT | 12 refills | Status: AC | PRN

## 2020-07-27 MED ORDER — MAGNESIUM SULFATE 2 GM/50ML IV SOLN
2.0000 g | Freq: Once | INTRAVENOUS | Status: AC
  Administered 2020-07-27: 2 g via INTRAVENOUS
  Filled 2020-07-27: qty 50

## 2020-07-27 MED ORDER — DIPHENHYDRAMINE HCL 50 MG PO CAPS: 50.0000 mg | ORAL_CAPSULE | Freq: Once | ORAL | Status: DC

## 2020-07-27 MED ORDER — DIPHENHYDRAMINE HCL 50 MG/ML IJ SOLN: 50.0000 mg | Freq: Once | INTRAMUSCULAR | Status: DC

## 2020-07-27 MED ORDER — POTASSIUM CHLORIDE CRYS ER 20 MEQ PO TBCR
40.0000 meq | EXTENDED_RELEASE_TABLET | Freq: Two times a day (BID) | ORAL | Status: AC
  Administered 2020-07-27 (×2): 40 meq via ORAL
  Filled 2020-07-27 (×2): qty 2

## 2020-07-27 MED ORDER — ALBUTEROL SULFATE (2.5 MG/3ML) 0.083% IN NEBU: 2.5000 mg | INHALATION_SOLUTION | Freq: Four times a day (QID) | RESPIRATORY_TRACT | 12 refills | Status: DC | PRN

## 2020-07-27 NOTE — Telephone Encounter (Signed)
Dr. Baldwin Crown response was passed along to [place]. Nothing further needed at this time.

## 2020-07-27 NOTE — Progress Notes (Signed)
  Echocardiogram 2D Echocardiogram has been performed.  Melinda Griffin 07/27/2020, 8:41 AM

## 2020-07-27 NOTE — Telephone Encounter (Signed)
I sent in an order for budesonide. They had told us she was using a nebulizer at home so I thought she already at albuterol. ALBUTEROL 0.083% EVERY 6 HOURS AS NEEDED. Ok about the prednisone.

## 2020-07-27 NOTE — Evaluation (Signed)
Physical Therapy Evaluation Patient Details Name: Melinda Griffin MRN: [id number] DOB: May 23, 1941 Today's Date: 07/27/2020   History of Present Illness  Patient is an 79 year old female with a history of dementia, asthma, hyperlipidemia, declining cognitive status who lives with her daughter and is presenting  07/26/20  due to worsening mental status, lethargy and agitation, decreased oral intake, SOB requiring Oxygen.  Clinical Impression  Patient resides with daughter and is modified independent with ambulation and personal care. Per daughter , does have supervision, using a monitor when patient alone for safety.  Patient pleasant today and ambulated with Hand hold assistance x 60' on RA with SPO2 91%, up to 93% quickly. Not noted to be SOB.RN aware that patient remained on RA at this time.  Patient currently requires assistance 24/7  For ADL's and safe ambulation. Daughter present.  Pt admitted with above diagnosis.  Pt currently with functional limitations due to the deficits listed below (see PT Problem List). Pt will benefit from skilled PT to increase their independence and safety with mobility to allow discharge to the venue listed below.       Follow Up Recommendations SNF    Equipment Recommendations  None recommended by PT    Recommendations for Other Services       Precautions / Restrictions Precautions Precautions: Fall Precaution Comments: has been agitated at times Restrictions Weight Bearing Restrictions: No      Mobility  Bed Mobility Overal bed mobility: Needs Assistance Bed Mobility: Supine to Sit;Sit to Supine     Supine to sit: Supervision;HOB elevated Sit to supine: Supervision   General bed mobility comments: patient requires cues to initiate task, supervision for safety     Transfers Overall transfer level: Needs assistance Equipment used: Rolling walker (2 wheeled);1 person hand held assist Transfers: Sit to/from Stand Sit to Stand: Min  assist         General transfer comment: initially trialed walker however patient unfamiliar and pushing off to the side therefore transitioned to HHA. patient did have two minor loss of balance with ambulation requiring min A for safety   Ambulation/Gait Ambulation/Gait assistance: Min assist Gait Distance (Feet): 20 Feet (then 60) Assistive device: 1 person hand held assist Gait Pattern/deviations: Step-through pattern Gait velocity: decreased   General Gait Details: gait slow, especially when  talking while walking.  Stairs            Wheelchair Mobility    Modified Rankin (Stroke Patients Only)       Balance Overall balance assessment: Needs assistance Sitting-balance support: Feet supported Sitting balance-Leahy Scale: Fair     Standing balance support: Single extremity supported;No upper extremity supported Standing balance-Leahy Scale: Poor Standing balance comment: reliant on external assist for safety                             Pertinent Vitals/Pain Pain Assessment: Faces Faces Pain Scale: No hurt    Home Living Family/patient expects to be discharged to:: Private residence Living Arrangements: Children Available Help at Discharge: Family Type of Home: House       Home Layout: One level Home Equipment: None Additional Comments: attends Memory day care M-F    Prior Function Level of Independence: Independent         Comments: per DTR patient completes her dressing, utilizes walk in tub for bathing, does her own toileting without AD. goes to adult day care program 5 days a week  Hand Dominance   Dominant Hand:  (did not specify)    Extremity/Trunk Assessment   Upper Extremity Assessment Upper Extremity Assessment: Generalized weakness    Lower Extremity Assessment Lower Extremity Assessment: Defer to PT evaluation    Cervical / Trunk Assessment Cervical / Trunk Assessment: Normal  Communication   Communication:  Expressive difficulties;Other (comment) (word salad)  Cognition Arousal/Alertness: Awake/alert Behavior During Therapy: WFL for tasks assessed/performed Overall Cognitive Status: History of cognitive impairments - at baseline                                 General Comments: patient  able to participate and was calm throughout mobility      General Comments General comments (skin integrity, edema, etc.): patient maintained 91-93% on room air throughout session    Exercises     Assessment/Plan    PT Assessment Patient needs continued PT services  PT Problem List Decreased strength;Decreased balance;Decreased activity tolerance;Decreased safety awareness;Decreased mobility       PT Treatment Interventions Gait training;Functional mobility training;Therapeutic activities;Therapeutic exercise    PT Goals (Current goals can be found in the Care Plan section)  Acute Rehab PT Goals Patient Stated Goal: to get back to her baseline and be home PT Goal Formulation: With family Time For Goal Achievement: 08/10/20 Potential to Achieve Goals: Good    Frequency Min 2X/week   Barriers to discharge        Co-evaluation   Reason for Co-Treatment: To address functional/ADL transfers;For patient/therapist safety   OT goals addressed during session: ADL's and self-care       AM-PAC PT "6 Clicks" Mobility  Outcome Measure Help needed turning from your back to your side while in a flat bed without using bedrails?: A Little Help needed moving from lying on your back to sitting on the side of a flat bed without using bedrails?: A Little Help needed moving to and from a bed to a chair (including a wheelchair)?: A Little Help needed standing up from a chair using your arms (e.g., wheelchair or bedside chair)?: A Little Help needed to walk in hospital room?: A Little Help needed climbing 3-5 steps with a railing? : A Lot 6 Click Score: 17    End of Session   Activity  Tolerance: Patient tolerated treatment well Patient left: in bed;with call bell/phone within reach;with bed alarm set;with family/visitor present Nurse Communication: Mobility status PT Visit Diagnosis: Unsteadiness on feet (R26.81);Difficulty in walking, not elsewhere classified (R26.2)    Time: 9450-3888 PT Time Calculation (min) (ACUTE ONLY): 29 min   Charges:   PT Evaluation $PT Eval Low Complexity: [place] PT Acute Rehabilitation Services Pager [phone] Office [phone]   Claretha Cooper 07/27/2020, 3:06 PM

## 2020-07-27 NOTE — Evaluation (Signed)
Clinical/Bedside Swallow Evaluation Patient Details  Name: Melinda Griffin MRN: [id number] Date of Birth: [birth year]  Today's Date: 07/27/2020 Time: SLP Start Time (ACUTE ONLY): 1115 SLP Stop Time (ACUTE ONLY): 1155 SLP Time Calculation (min) (ACUTE ONLY): 40 min  Past Medical History:  Past Medical History:  Diagnosis Date  . Allergic rhinitis   . Anxiety   . Asthma   . Hyperlipidemia   . Migraine with aura, without mention of intractable migraine without mention of status migrainosus    Past Surgical History:  Past Surgical History:  Procedure Laterality Date  . APPENDECTOMY  1975  . BREAST BIOPSY Left 1976  . TUBAL LIGATION Bilateral 1976   HPI:  pt is an 79 yo female adm to [facility] with AMS, progressive decline - and respiratory failure.  Pt became agitated and was hypoxic.  Pt PMH + for advanced dementia, anxiety, migraines.  She sees Dr Melvyn Novas and has undergone an MBS as an OP.  OP MBS 05/2020 revealed normal oropharyngeal swallow.   Assessment / Plan / Recommendation Clinical Impression  Patient presents with functional oropharyngeal swallow based on clinical swallow evaluation. She was able to feed herself with clinically adequate mastication of solids, transiting of puree and thin.  No indications of aspiration with all po observed.  Pt is noted to have erythemic faucial pillars but denies pain.  Recommend continue diet as tolerated.  Of note, [name] arrived at completion of session and desired guidelines for dysphagia and states SLP HH is to see her mom for advise. Daughter reports pt has lost weight with recent illness and encouraging eating is a struggle for her and the pt, especially in the evenings when pt is sundowning.  [name] reports pt eats solids at rapid pace and will cough at times causing her to vomit and pass out.  Advised her to [facility] variety of liquid supplements *as pt may tire of them quickly* especially when pt is more inclined to be resistant.  [name]  raised many concerns re: discharge planning due to her inability to lift pt, etc  SLP encouraged her to speak to MD regarding this issue and advised that PT/OT are also ordered for evaluation. Swallow function is intact based on prior MBS and current clinical evaluation.  Informed daughter to gustatory changes associated with dementia.  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  Of note, pt was not impulsive during SLP evaluation self feeding. Pt demonstrated word salad during evaluation.  Appreciate pallaitive consult as hopefully po for enjoyment rather than nutritional support will be considered given pt progressive weight loss and decreased po associated with her progressive cognitive decline.  Thanks for this referral. SLP Visit Diagnosis: Dysphagia, unspecified (R13.10)    Aspiration Risk  Mild aspiration risk    Diet Recommendation Dysphagia 3 (Mech soft);Thin liquid due to impulsivity PTA  Liquid Administration via: Spoon;Cup Medication Administration: Whole meds with liquid Supervision: Patient able to self feed;Intermittent supervision to cue for compensatory strategies Compensations: Slow rate;Small sips/bites Postural Changes: Seated upright at 90 degrees;Remain upright for at least 30 minutes after po intake    Other  Recommendations Oral Care Recommendations: Oral care BID   Follow up Recommendations Home health SLP (per daughter)      Frequency and Duration   n/a         Prognosis   n/a     Swallow Study   General Date of Onset: 07/27/20 HPI: pt is  an 79 yo female adm to [facility] with AMS, progressive decline - and respiratory failure.  Pt became agitated and was hypoxic.  Pt PMH + for advanced dementia, anxiety, migraines.  She sees Dr Melvyn Novas and has undergone an MBS as an OP.  OP MBS 05/2020 revealed normal oropharyngeal swallow. Type of Study: Bedside Swallow Evaluation Diet Prior to this Study:  Dysphagia 3 (soft);Thin liquids Temperature Spikes Noted: No Respiratory Status: Nasal cannula History of Recent Intubation: No Behavior/Cognition: Alert;Cooperative;Pleasant mood (pt has word salad) Oral Cavity Assessment: Within Functional Limits Oral Care Completed by SLP: No Oral Cavity - Dentition: Adequate natural dentition Vision: Functional for self-feeding Self-Feeding Abilities: Able to feed self Patient Positioning: Upright in bed Baseline Vocal Quality: Normal Volitional Cough: Strong Volitional Swallow: Able to elicit    Oral/Motor/Sensory Function Overall Oral Motor/Sensory Function: Within functional limits   Ice Chips Ice chips: Not tested   Thin Liquid Thin Liquid: Impaired Presentation: Straw Other Comments: pt did not pass yale 3 ounce water challenge as she required frequent rest breaks, however swallow was timely and voice was clear    Nectar Thick Nectar Thick Liquid: Not tested   Honey Thick Honey Thick Liquid: Not tested   Puree Puree: Within functional limits Presentation: Self Fed;Spoon   Solid     Solid: Within functional limits Presentation: Self Fed      Melinda Griffin 07/27/2020,12:23 PM    [name], MS [facility] SLP Acute Rehab Services Office [phone] Pager [phone]

## 2020-07-27 NOTE — Progress Notes (Signed)
Occupational Therapy Evaluation   Patient resides with DTR and is primarily mod I to supervision level for BADL and transfers, does not use AD. Patient attends adult day program M-F. Currently patient requiring min A for ambulation and transfers with two lateral LOB. Patient require mod A for LB dressing and clothing management during toileting with mod cues for sequencing as patient attempts to pull up underwear before sitting on toilet to void. Recommend continued acute OT services to maximize patient safety and independence with self care in order to facilitate D/C to venue listed below.    07/27/20 1500  OT Visit Information  Last OT Received On 07/27/20  Assistance Needed +1  PT/OT/SLP Co-Evaluation/Treatment Yes  Reason for Co-Treatment To address functional/ADL transfers;For patient/therapist safety  History of Present Illness Patient is an 79 year old female with a history of dementia, asthma, hyperlipidemia, declining cognitive status who lives with her daughter and is presenting  07/26/20  due to worsening mental status, lethargy and agitation, decreased oral intake, SOB requiring Oxygen.  Precautions  Precautions Fall  Precaution Comments has been agitated at times  Restrictions  Weight Bearing Restrictions No  Home Living  Family/patient expects to be discharged to: Private residence  Living Arrangements Children  Available Help at Discharge Family  Type of [place] One level  Bathroom Shower/Tub Other (comment) (walk in tub)  Home Equipment None  Additional Comments attends Memory day care M-F  Prior Function  Level of Independence Independent  Comments per DTR patient completes her dressing, utilizes walk in tub for bathing, does her own toileting without AD. goes to adult day care program 5 days a week   Communication  Communication Expressive difficulties;Other (comment) (word salad)  Pain Assessment  Pain Assessment Faces  Faces Pain Scale 0   Cognition  Arousal/Alertness Awake/alert  Behavior During Therapy WFL for tasks assessed/performed  Overall Cognitive Status History of cognitive impairments - at baseline  Upper Extremity Assessment  Upper Extremity Assessment Generalized weakness  Lower Extremity Assessment  Lower Extremity Assessment Defer to PT evaluation  ADL  Overall ADL's  Needs assistance/impaired  Grooming Wash/dry hands;Minimal assistance;Standing  Grooming Details (indicate cue type and reason) for safety/balance  Upper Body Bathing Supervision/ safety;Sitting  Lower Body Bathing Minimal assistance;Sit to/from stand;Sitting/lateral leans  Upper Body Dressing  Supervision/safety;Sitting  Lower Body Dressing Moderate assistance;Sitting/lateral leans  Lower Body Dressing Details (indicate cue type and reason) patient initiaties donning sock, requires assist to complete pulling over foot in long sitting in bed  Toilet Transfer Minimal assistance;Cueing for safety;Cueing for sequencing;Ambulation;Regular Radio producer Details (indicate cue type and reason) initially tried rolling walker however patient leaving off to the side therefore transitioned to HHA. patient require min A and mod cues to complete task of sitting onto toilet  Toileting- Clothing Manipulation and Hygiene Moderate assistance;Sitting/lateral lean;Sit to/from stand  Toileting - Clothing Manipulation Details (indicate cue type and reason) patient having difficulty with sequencing, pulling underwear down then back up before sitting on toilet to void  Functional mobility during ADLs Minimal assistance;Cueing for safety;Cueing for sequencing  General ADL Comments per DTR patient is primarily supervision level for safety but is able to complete ADLs at baseline, currently patient requiring increased assist due to difficulty sequencing, decreased balance, activity tolerance  Bed Mobility  Overal bed mobility Needs Assistance  Bed Mobility Supine  to Sit;Sit to Supine  Supine to sit Supervision;HOB elevated  Sit to supine Supervision  General bed mobility comments patient requires cues  to initiate task, supervision for safety   Transfers  Overall transfer level Needs assistance  Equipment used Rolling walker (2 wheeled);1 person hand held assist  Transfers Sit to/from Stand  Sit to Stand Min assist  General transfer comment initially trialed walker however patient unfamiliar and pushing off to the side therefore transitioned to HHA. patient did have two minor loss of balance with ambulation requiring min A for safety   Balance  Overall balance assessment Needs assistance  Sitting-balance support Feet supported  Sitting balance-Leahy Scale Fair  Standing balance support Single extremity supported;No upper extremity supported  Standing balance-Leahy Scale Poor  Standing balance comment reliant on external assist for safety  General Comments  General comments (skin integrity, edema, etc.) patient maintained 91-93% on room air throughout session  OT - End of Session  Equipment Utilized During Treatment Rolling walker  Activity Tolerance Patient tolerated treatment well  Patient left in bed;with call bell/phone within reach;with family/visitor present;with bed alarm set;Other (comment) (mits)  Nurse Communication Mobility status;Other (comment) (O2 on room air)  OT Assessment  OT Recommendation/Assessment Patient needs continued OT Services  OT Visit Diagnosis Other abnormalities of gait and mobility (R26.89);Unsteadiness on feet (R26.81)  OT Problem List Decreased activity tolerance;Impaired balance (sitting and/or standing)  OT Plan  OT Frequency (ACUTE ONLY) Min 2X/week  OT Treatment/Interventions (ACUTE ONLY) Self-care/ADL training;Therapeutic activities;Patient/family education;Balance training;Therapeutic exercise  AM-PAC OT "6 Clicks" Daily Activity Outcome Measure (Version 2)  Help from another person eating meals? 3   Help from another person taking care of personal grooming? 3  Help from another person toileting, which includes using toliet, bedpan, or urinal? 2  Help from another person bathing (including washing, rinsing, drying)? 3  Help from another person to put on and taking off regular upper body clothing? 3  Help from another person to put on and taking off regular lower body clothing? 2  6 Click Score 16  OT Recommendation  Follow Up Recommendations SNF  OT Equipment None recommended by OT  Individuals Consulted  Consulted and Agree with Results and Recommendations Family member/caregiver  Family Member Consulted DTR  Acute Rehab OT Goals  Patient Stated Goal to keep her independence as long as possible  OT Goal Formulation With family  Time For Goal Achievement 08/10/20  Potential to Achieve Goals Good  OT Time Calculation  OT Start Time (ACUTE ONLY) 1316  OT Stop Time (ACUTE ONLY) 1345  OT Time Calculation (min) 29 min  OT General Charges  $OT Visit 1 Visit  OT Evaluation  $OT Eval Low Complexity 1 Low  Written Expression  Dominant Hand  (did not specify)   Delbert Phenix OT OT pager: [phone]

## 2020-07-27 NOTE — Care Management Obs Status (Signed)
[place] NOTIFICATION   Patient Details  Name: Melinda Griffin MRN: [id number] Date of Birth: October 04, 1940   Medicare Observation Status Notification Given:  Yes    Lynnell Catalan, RN 07/27/2020, 2:59 PM

## 2020-07-27 NOTE — Progress Notes (Signed)
PROGRESS NOTE    Melinda Griffin  ATF:[id number] DOB: 21-Oct-1940 DOA: 07/26/2020 PCP: Seward Carol, MD   Brief Narrative:  Melinda Griffin is a 79 y.o. female with medical history significant of anxiety, history of asthma, history of hyperlipidemia, GERD, history of migraines as well as other comorbidities as well as advanced dementia who sees a neurologist at [facility] who presents with a chief complaint of worsening hypoxia, confusion, agitation.  Patient daughter is at bedside and patient is unable to provide a subjective history at this time given her current condition advanced dementia.  Daughter states that since September she is having multiple recurrent syncopal episodes of unclear etiology.  Syncope was to be worked up in the outpatient setting with her cardiologist and Dr. Eduard Clos ordered an echocardiogram as well as carotid Doppler which were still to be done and were to be done yesterday.  Patient was found to have worsening hypoxia and daughter monitors her oxygen saturation was found to be in the low 80s.  Patient was taken to her pulmonologist who referred her to receive home oxygen DME and have home health arranged.  Patient was discharged back from her pulmonary appointment with supplemental oxygen and was called in albuterol as well as budesonide and doxycycline for possible pneumonia.  Chest x-rays obtained yesterday and shows a possible viral process.  This morning the patient continued become very agitated and kept pulling off her supplemental oxygen via nasal cannula and when she continued be hypoxic EMS was called and EMS came and arrived and found the oxygen canister to be empty.  Patient was transported to [facility] for further evaluation given her hypoxia, confusion and agitation.  Patient has a known history of dementia and daughter states is progressively worsened in the last 4 months or so.  Patient's daughter does also state that she has a cough that is  sometimes productive but most times it is not.  Patient has not had any wheezing but has been more confused and agitated and combative with the daughter.  Because of the constellation of symptoms [facility] was asked to admit this patient for acute respiratory failure with hypoxia in the setting of possible viral pneumonia and also because of her recurrent syncope of unknown etiology.  ED Course: In the ED patient had basic blood work done, a EKG done.  Chest x-ray done yesterday.  She was given a 1 L LR bolus and placed on empiric antibiotics with ceftriaxone and azithromycin.  Of note Covid testing was negative and patient received all 3 of her Covid vaccines.  **Interim History  Work-up was essentially negative and CTA was done given her elevated D-dimer and it showed multifocal pneumonia likely viral in origin.  Patient WBC has trended up to 11.5.  We will continue empiric antibiotics for now given pulmonology recommendations.  PT OT recommending SNF.  No evidence of DVT on lower extremity duplex.  Overall her labs remain relatively stable but she was confused overnight  Assessment & Plan:   Active Problems:   Acute respiratory failure (HCC)  Acute Respiratory Failure with Hypoxia in the setting of Viral CAP poA vs Viral Pneumonitis, ruled out PE -Place in Obs Telemetry -Saw Pulmonology the day before yesterday and they obtained CXR on 11/30  -CXR showed "The heart size and mediastinal contours are within normal limits. Increased prominence of interstitial lung markings is seen throughout both lungs, suspicious for viral or other atypical pneumonitis; interstitial edema is considered less likely. No evidence pulmonary consolidation  or pleural effusion." -Order for DME New O2 initiated yesterday but Home Health Arrived with an Empty O2 Cannister and [place] never showed yesterday -Will Treat CAP Pneumonia -Check SLP to ensure patient is not aspirating and she is now on a dysphagia 3 diet -WBC was  9.9 and Lactic Acid was 0.9 -Respiratory virus panel was negative -Will continue Empiric Abx for CAP coverage with Azithromycin and Ceftriaxone until we obtain a PCT; Was given Doxycycline by Pulmonary the day before yesterday and pulmonary recommending continuing doxycycline at discharge for 5 days total; while she is hospitalized in-house we will continue IV ceftriaxone and IV azithromycin -Check Respiratory Virus Panel and Empiric Droplet Precautions -SpO2: 91 % O2 Flow Rate (L/min): 2 L/min -Continuous Pulse Oximetry and Maintain O2 Saturations >90% -[place] Supplemental O2 via [place] and wean O2 as tolerated -Singular and Breo-Ellipta were stopped at Last visit -Will start Budesonide 0.25 mg BID and Start Xopenex/Atrovent; Hold Albuterol Inhaler as patient had difficulty comprehending how to use it -Obtained D-dimer is 1.03 -CTA of the chest showed no evidence of PE and there is no thoracic aortic aneurysm or dissection.  There is an area of somewhat ill-defined airspace opacity in the lower lung regions and inferior lingula likely representing multifocal pneumonia -Guaifenesin, Flutter Valve, and Incentive Spirometry -WBC slightly worsened from 9.92 10.9 is now 11.5 today -Patient sees Dr. Lamonte Sakai as an outpatientand has an appointment set up for Monday as an outpatient -TOC Consulted for assistance with Home Health  Elevated D-Dimer -Extremity duplex negative for VTE -CTA was negative for PE  Agitation and Worsened Encephalopathy in the setting of Hypoxia superimposed on Chronic Dementia -Given a Liter of LR in the ED  -Monitor overnight and she became agitated so she is in [place] restraints -Check U/A and Urine Cx as well as Blood Cxto r/o UTI and/or Bacteremia -Gentle IVF hydration with NS 75 mL/hr x 1 Day this is can to stop today -Treat possible PNA with Abx as above  -If Encephalopathy Persists will obtain Head CT imaging; Recent Head CT Done 3 weeks ago and showed "No acute  intracranial findings.Chronic microvascular angiopathy and parenchymal volume loss" -Likely this is the progression of Dementia -Will consult Palliative Care for Further GOC Discussion and patient to follow-up with palliative care in outpatient setting at SNF -Check TSH, RPR nonreactive, B12was 3633 -Delirium Precautions -C/w Mirtazapine 15 mg po qHS and Citalopram 40 mg po Daily -WILL NOT TRY ATIVAN or HALDOL; If patient becomes agitated will try Olanzapine 2.5 mg po DailyPRN and Soft Mitten Restraints  -May need to increase olanzapine as she is extremely agitated last night  Recurrent Syncope -Unclear Etiology but has been happening since September  -C/w Telemetry Monitoring; No events noted on telemetry  -Troponin Flat at 3 x2  -Check ECHOCardiogram and showed grade 1 diastolic dysfunction but normal EF -Recent Head CT Done for Syncopal Episode Nov 9th -Check Orthostatic VS to be done -C/w Gentle IVF Hydration with NS at 23mL/hr and will stop today -Check D-Dimer and if warranted CTA of Chest; She has a new Hypoxia and ? Related to a PE given that shes had a Hx of Recurrent Syncope; D-dimer was elevated slightly and CTA as above -PT OT evaluating and recommending SNF -Sees Cardiology Dr. Margaretann Loveless as an outpatientand can follow-up at discharge  GERD -C/w Omeprazole 10 mg po BID substitution with Pantoprazole 40 mg po Daily   HLD -C/w Lovastatin 40 mg po qHS subsitution with Pravastatin while hospitalized  Hyperglycemia -Patient's Blood Sugar was 112 -Continue to Monitor Blood Sugars Carefully; and the blood glucose this morning is 102 and repeat blood sugar this a.m. on CMP was 106  Leukocytosis -Mild and slightly worsened -Patient WBC went from 9.9 -> 10.9 -> 11.5 -Continue to monitor and trend and she is getting empiric treatment with antibiotics for a -Urinalysis done and showed a hazy appearance with 20 leukocytes, negative nitrates, rare bacteria, 0-5 squamous  epithelial cells, and greater than 50 WBCs -Blood cultures x2 is pending and urine culture still pending -CTA shows multifocal pneumonia We will continue monitor and trend and repeat CBC in a.m.  GOC: DNR, poA -Has Advanced Directives -Daughter requests to speak with Palliative Care so will consult  DVT prophylaxis: Enoxaparin Code Status: DO NOT RESUSCITATE Family Communication: No family present at bedside Disposition Plan: Anticipate discharging to SNF next 24 to 48 hours  Status is: Inpatient  Remains inpatient appropriate because:Unsafe d/c plan, IV treatments appropriate due to intensity of illness or inability to take PO and Inpatient level of care appropriate due to severity of illness   Dispo: The patient is from: Home              Anticipated d/c is to: SNF              Anticipated d/c date is: 2 days              Patient currently not medically stable to D/C  Consultants:   Palliative Care Medicine    Procedures:  ECHOCARDIOGRAM IMPRESSIONS    1. Left ventricular ejection fraction, by estimation, is 60 to 65%. The  left ventricle has normal function. The left ventricle has no regional  wall motion abnormalities. Left ventricular diastolic parameters are  consistent with Grade I diastolic  dysfunction (impaired relaxation).  2. Right ventricular systolic function is normal. The right ventricular  size is normal.  3. The mitral valve is normal in structure. Trivial mitral valve  regurgitation. No evidence of mitral stenosis.  4. The aortic valve has an indeterminant number of cusps. Aortic valve  regurgitation is not visualized. No aortic stenosis is present.  5. The inferior vena cava is normal in size with greater than 50%  respiratory variability, suggesting right atrial pressure of 3 mmHg.   FINDINGS  Left Ventricle: Left ventricular ejection fraction, by estimation, is 60  to 65%. The left ventricle has normal function. The left ventricle has no   regional wall motion abnormalities. The left ventricular internal cavity  size was normal in size. There is  no left ventricular hypertrophy. Left ventricular diastolic parameters  are consistent with Grade I diastolic dysfunction (impaired relaxation).   Right Ventricle: The right ventricular size is normal. Right ventricular  systolic function is normal.   Left Atrium: Left atrial size was normal in size.   Right Atrium: Right atrial size was normal in size.   Pericardium: There is no evidence of pericardial effusion.   Mitral Valve: The mitral valve is normal in structure. Mild mitral annular  calcification. Trivial mitral valve regurgitation. No evidence of mitral  valve stenosis.   Tricuspid Valve: The tricuspid valve is normal in structure. Tricuspid  valve regurgitation is trivial. No evidence of tricuspid stenosis.   Aortic Valve: The aortic valve has an indeterminant number of cusps.  Aortic valve regurgitation is not visualized. No aortic stenosis is  present.   Pulmonic Valve: The pulmonic valve was not well visualized. Pulmonic valve  regurgitation is  not visualized. No evidence of pulmonic stenosis.   Aorta: The aortic root is normal in size and structure.   Venous: The inferior vena cava is normal in size with greater than 50%  respiratory variability, suggesting right atrial pressure of 3 mmHg.       LEFT VENTRICLE  PLAX 2D  LVIDd:     3.67 cm   Diastology  LVIDs:     2.57 cm   LV e' medial:  7.83 cm/s  LV PW:     0.96 cm   LV E/e' medial: 13.5  LV IVS:    1.00 cm   LV e' lateral:  8.05 cm/s  LVOT diam:   2.10 cm   LV E/e' lateral: 13.2  LV SV:     72  LV SV Index:  47  LVOT Area:   3.46 cm    LV Volumes (MOD)  LV vol d, MOD A2C: 42.9 ml  LV vol d, MOD A4C: 50.2 ml  LV vol s, MOD A2C: 15.9 ml  LV vol s, MOD A4C: 20.6 ml  LV SV MOD A2C:   27.0 ml  LV SV MOD A4C:   50.2 ml  LV SV MOD BP:   27.4 ml    RIGHT VENTRICLE      IVC  RV S prime:   9.79 cm/s IVC diam: 1.58 cm  TAPSE (M-mode): 1.8 cm   LEFT ATRIUM       Index    RIGHT ATRIUM      Index  LA diam:    3.40 cm 2.23 cm/m RA Area:   10.40 cm  LA Vol (A2C):  30.6 ml 20.04 ml/m RA Volume:  24.10 ml 15.78 ml/m  LA Vol (A4C):  23.3 ml 15.26 ml/m  LA Biplane Vol: 29.5 ml 19.32 ml/m  AORTIC VALVE  LVOT Vmax:  112.00 cm/s  LVOT Vmean: 69.900 cm/s  LVOT VTI:  0.208 m    AORTA  Ao Root diam: 3.00 cm   MITRAL VALVE  MV Area (PHT): 4.06 cm   SHUNTS  MV Decel Time: 187 msec   Systemic VTI: 0.21 m  MV E velocity: 106.00 cm/s Systemic Diam: 2.10 cm  MV A velocity: 107.00 cm/s  MV E/A ratio: 0.99   LE VENOUS DUPLEX Summary:  RIGHT:  - There is no evidence of deep vein thrombosis in the lower extremity.    - No cystic structure found in the popliteal fossa.    LEFT:  - There is no evidence of deep vein thrombosis in the lower extremity.    - No cystic structure found in the popliteal fossa.    *See table(s) above for measurements and observations.   Antimicrobials: Anti-infectives (From admission, onward)   Start     Dose/Rate Route Frequency Ordered Stop   07/27/20 1800  azithromycin (ZITHROMAX) 500 mg in sodium chloride 0.9 % 250 mL IVPB        500 mg 250 mL/hr over 60 Minutes Intravenous Every 24 hours 07/26/20 2106 07/31/20 1759   07/27/20 1600  cefTRIAXone (ROCEPHIN) 2 g in sodium chloride 0.9 % 100 mL IVPB        2 g 200 mL/hr over 30 Minutes Intravenous Every 24 hours 07/26/20 2106 07/31/20 1559   07/26/20 1700  cefTRIAXone (ROCEPHIN) 1 g in sodium chloride 0.9 % 100 mL IVPB        1 g 200 mL/hr over 30 Minutes Intravenous  Once 07/26/20 1659 07/26/20 1759   07/26/20 1700  azithromycin (ZITHROMAX) 500 mg in sodium chloride 0.9 % 250 mL IVPB        500 mg 250 mL/hr over 60 Minutes Intravenous  Once 07/26/20 1659 07/26/20 1921        Subjective: Seen and  examined at bedside and she is pleasantly confused and demented and unable to provide subjective history.  No nausea or vomiting.  Appeared calm on my examination but parent was very restless and fidgety and agitated last night.  Had to be placed in soft mitten restraints.  No other concerns or points at this time and no family currently at bedside  Objective: Vitals:   07/27/20 0051 07/27/20 0519 07/27/20 0857 07/27/20 1430  BP: (!) 142/67 (!) 129/56 (!) 123/55 (!) 123/52  Pulse: 79 74 79 82  Resp: 16 16 20  (!) 23  Temp: 97.6 F (36.4 C) 97.6 F (36.4 C) 98 F (36.7 C) 97.8 F (36.6 C)  TempSrc: Oral Oral Oral Oral  SpO2: 100% 95% 96% 91%  Weight:      Height:        Intake/Output Summary (Last 24 hours) at 07/27/2020 1652 Last data filed at 07/27/2020 1249 Gross per 24 hour  Intake 1430 ml  Output --  Net 1430 ml   Filed Weights   07/26/20 1357 07/26/20 2105  Weight: 58.5 kg 55 kg   Examination: Physical Exam:  Constitutional: WN/WD chronically ill-appearing Caucasian female currently in NAD and appears calm and comfortable but is pleasantly confused and demented Eyes: Lids and conjunctivae normal, sclerae anicteric  ENMT: External Ears, Nose appear normal. Grossly normal hearing.  Neck: Appears normal, supple, no cervical masses, normal ROM, no appreciable thyromegaly; no JVD Respiratory: Diminished to auscultation bilaterally with coarse breath sounds especially at the bases, no wheezing, rales, rhonchi or crackles. Normal respiratory effort and patient is not tachypenic. No accessory muscle use.  Wearing supplemental oxygen nasal cannula Cardiovascular: RRR, no murmurs / rubs / gallops. S1 and S2 auscultated.  Minimal extremity edema.  Abdomen: Soft, non-tender, non-distended. Bowel sounds positive.  GU: Deferred. Musculoskeletal: No clubbing / cyanosis of digits/nails. No joint deformity upper and lower extremities.  Skin: No rashes, lesions, ulcers on to skin  evaluation. No induration; Warm and dry.  Neurologic: CN 2-12 grossly intact with no focal deficits. Romberg sign and cerebellar reflexes not assessed.  Psychiatric: Impaired judgment and insight.  She is awake but not fully alert and oriented x 3. Normal mood and appropriate affect.   Data Reviewed: I have personally reviewed following labs and imaging studies  CBC: Recent Labs  Lab 07/26/20 1540 07/26/20 2200 07/27/20 0614  WBC 9.9 10.9* 11.5*  NEUTROABS 5.5  --   --   HGB 13.5 12.3 12.1  HCT 39.0 37.2 35.9*  MCV 90.3 92.8 92.5  PLT 334 312 774   Basic Metabolic Panel: Recent Labs  Lab 07/26/20 1540 07/27/20 0614  NA 140 139  K 3.8 3.5  CL 103 106  CO2 24 21*  GLUCOSE 112* 98  BUN 19 14  CREATININE 0.74 0.66  CALCIUM 9.2 8.4*  MG  --  1.7  PHOS  --  2.6   GFR: Estimated Creatinine Clearance: 41.6 mL/min (by C-G formula based on SCr of 0.66 mg/dL). Liver Function Tests: Recent Labs  Lab 07/26/20 1540 07/27/20 0614  AST 18 20  ALT 16 16  ALKPHOS 69 55  BILITOT 0.7 0.5  PROT 8.1 6.7  ALBUMIN 3.5 3.0*   No results for input(s): LIPASE, AMYLASE  in the last 168 hours. No results for input(s): AMMONIA in the last 168 hours. Coagulation Profile: No results for input(s): INR, PROTIME in the last 168 hours. Cardiac Enzymes: No results for input(s): CKTOTAL, CKMB, CKMBINDEX, TROPONINI in the last 168 hours. BNP (last 3 results) No results for input(s): PROBNP in the last 8760 hours. HbA1C: No results for input(s): HGBA1C in the last 72 hours. CBG: Recent Labs  Lab 07/27/20 0533  GLUCAP 102*   Lipid Profile: No results for input(s): CHOL, HDL, LDLCALC, TRIG, CHOLHDL, LDLDIRECT in the last 72 hours. Thyroid Function Tests: Recent Labs    07/27/20 0614  TSH 0.407   Anemia Panel: Recent Labs    07/26/20 2200  VITAMINB12 3,633*   Sepsis Labs: Recent Labs  Lab 07/26/20 1540 07/26/20 2200 07/27/20 0614  PROCALCITON  --  <0.10 <0.10  LATICACIDVEN  0.8  --   --     Recent Results (from the past 240 hour(s))  Resp Panel by RT-PCR (Flu A&B, Covid) Nasopharyngeal Swab     Status: None   Collection Time: 07/26/20  3:48 PM   Specimen: Nasopharyngeal Swab; Nasopharyngeal(NP) swabs in vial transport medium  Result Value Ref Range Status   SARS Coronavirus 2 by RT PCR NEGATIVE NEGATIVE Final    Comment: (NOTE) SARS-CoV-2 target nucleic acids are NOT DETECTED.  The SARS-CoV-2 RNA is generally detectable in upper respiratory specimens during the acute phase of infection. The lowest concentration of SARS-CoV-2 viral copies this assay can detect is 138 copies/mL. A negative result does not preclude SARS-Cov-2 infection and should not be used as the sole basis for treatment or other patient management decisions. A negative result may occur with  improper specimen collection/handling, submission of specimen other than nasopharyngeal swab, presence of viral mutation(s) within the areas targeted by this assay, and inadequate number of viral copies(<138 copies/mL). A negative result must be combined with clinical observations, patient history, and epidemiological information. The expected result is Negative.  Fact Sheet for Patients:  EntrepreneurPulse.com.au  Fact Sheet for Healthcare Providers:  IncredibleEmployment.be  This test is no t yet approved or cleared by the [place] FDA and  has been authorized for detection and/or diagnosis of SARS-CoV-2 by FDA under an Emergency Use Authorization (EUA). This EUA will remain  in effect (meaning this test can be used) for the duration of the COVID-19 declaration under Section 564(b)(1) of the Act, 21 U.S.C.section 360bbb-3(b)(1), unless the authorization is terminated  or revoked sooner.       Influenza A by PCR NEGATIVE NEGATIVE Final   Influenza B by PCR NEGATIVE NEGATIVE Final    Comment: (NOTE) The Xpert Xpress SARS-CoV-2/FLU/RSV plus assay is  intended as an aid in the diagnosis of influenza from Nasopharyngeal swab specimens and should not be used as a sole basis for treatment. Nasal washings and aspirates are unacceptable for Xpert Xpress SARS-CoV-2/FLU/RSV testing.  Fact Sheet for Patients: EntrepreneurPulse.com.au  Fact Sheet for Healthcare Providers: IncredibleEmployment.be  This test is not yet approved or cleared by the [place] FDA and has been authorized for detection and/or diagnosis of SARS-CoV-2 by FDA under an Emergency Use Authorization (EUA). This EUA will remain in effect (meaning this test can be used) for the duration of the COVID-19 declaration under Section 564(b)(1) of the Act, 21 U.S.C. section 360bbb-3(b)(1), unless the authorization is terminated or revoked.  Performed at [facility], [place] [address], [place], [place] [address]   Blood culture (routine x 2)  Status: None (Preliminary result)   Collection Time: 07/26/20  5:12 PM   Specimen: BLOOD  Result Value Ref Range Status   Specimen Description   Final    BLOOD LEFT ANTECUBITAL Performed at [place] [address], [place], [place] [address]    Special Requests   Final    BOTTLES DRAWN AEROBIC AND ANAEROBIC Blood Culture adequate volume Performed at [facility] [address], [place], [place] [address]    Culture   Final    NO GROWTH < 12 HOURS Performed at [facility] [address], [place], [place] [address]    Report Status PENDING  Incomplete  Blood culture (routine x 2)     Status: None (Preliminary result)   Collection Time: 07/26/20  5:17 PM   Specimen: BLOOD LEFT FOREARM  Result Value Ref Range Status   Specimen Description   Final    BLOOD LEFT FOREARM Performed at [facility] [address] [place], [place] [address]    Special Requests   Final    BOTTLES DRAWN AEROBIC AND ANAEROBIC  Blood Culture results may not be optimal due to an inadequate volume of blood received in culture bottles   Culture   Final    NO GROWTH < 12 HOURS Performed at [facility] [address] [place], [place] [address]    Report Status PENDING  Incomplete  Respiratory Panel by PCR     Status: None   Collection Time: 07/26/20  6:01 PM   Specimen: Nasopharyngeal Swab; Respiratory  Result Value Ref Range Status   Adenovirus NOT DETECTED NOT DETECTED Final   Coronavirus 229E NOT DETECTED NOT DETECTED Final    Comment: (NOTE) The Coronavirus on the Respiratory Panel, DOES NOT test for the novel  Coronavirus (2019 nCoV)    Coronavirus HKU1 NOT DETECTED NOT DETECTED Final   Coronavirus NL63 NOT DETECTED NOT DETECTED Final   Coronavirus OC43 NOT DETECTED NOT DETECTED Final   Metapneumovirus NOT DETECTED NOT DETECTED Final   Rhinovirus / Enterovirus NOT DETECTED NOT DETECTED Final   Influenza A NOT DETECTED NOT DETECTED Final   Influenza B NOT DETECTED NOT DETECTED Final   Parainfluenza Virus 1 NOT DETECTED NOT DETECTED Final   Parainfluenza Virus 2 NOT DETECTED NOT DETECTED Final   Parainfluenza Virus 3 NOT DETECTED NOT DETECTED Final   Parainfluenza Virus 4 NOT DETECTED NOT DETECTED Final   Respiratory Syncytial Virus NOT DETECTED NOT DETECTED Final   Bordetella pertussis NOT DETECTED NOT DETECTED Final   Chlamydophila pneumoniae NOT DETECTED NOT DETECTED Final   Mycoplasma pneumoniae NOT DETECTED NOT DETECTED Final    Comment: Performed at [facility], [place]. [address], [place], [place] [address]    RN Pressure Injury Documentation:     Estimated body mass index is 22.91 kg/m as calculated from the following:   Height as of this encounter: 5\' 1"  (1.549 m).   Weight as of this encounter: 55 kg.  Malnutrition Type:   Malnutrition Characteristics:   Nutrition Interventions:   Radiology Studies: DG Chest 2 View  Result Date: 07/25/2020 CLINICAL DATA:  Shortness of  breath. Acute respiratory failure with hypoxia. EXAM: CHEST - 2 VIEW COMPARISON:  06/14/2020 FINDINGS: The heart size and mediastinal contours are within normal limits. Increased prominence of interstitial lung markings is seen throughout both lungs, suspicious for viral or other atypical pneumonitis; interstitial edema is considered less likely. No evidence pulmonary consolidation or  pleural effusion. IMPRESSION: Increased diffuse interstitial prominence, suspicious for viral or other atypical pneumonitis, with interstitial edema considered less likely. Electronically Signed   By: Marlaine Hind M.D.   On: 07/25/2020 18:03   CT ANGIO CHEST PE W OR WO CONTRAST  Result Date: 07/27/2020 CLINICAL DATA:  Hypoxia EXAM: CT ANGIOGRAPHY CHEST WITH CONTRAST TECHNIQUE: Multidetector CT imaging of the chest was performed using the standard protocol during bolus administration of intravenous contrast. Multiplanar CT image reconstructions and MIPs were obtained to evaluate the vascular anatomy. CONTRAST:  18mL OMNIPAQUE IOHEXOL 350 MG/ML SOLN COMPARISON:  Chest radiograph July 27, 2020 FINDINGS: Cardiovascular: There is no demonstrable pulmonary embolus. There is no thoracic aortic aneurysm or dissection. There are scattered foci of calcification in visualized great vessels. There are foci of aortic atherosclerosis as well as foci of coronary artery calcification. There is no pericardial effusion or pericardial thickening. Mediastinum/Nodes: Thyroid appears unremarkable. There is no appreciable thoracic adenopathy. No esophageal lesions are evident. Lungs/Pleura: Small granuloma noted in the right upper lobe. Mild scarring in the extreme apices. There are ill-defined areas of airspace opacity in each lower lobe region, somewhat more notable on the right than on the left. Small area of opacity in the inferior lingula, likely a small focus of pneumonia. There is bibasilar atelectasis. No appreciable pleural effusions. Upper  Abdomen: Visualized upper abdominal structures appear unremarkable. 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 No blastic or lytic bone lesions. No evident chest wall lesions. Review of the MIP images confirms the above findings. IMPRESSION: 1. No evident pulmonary embolus. No thoracic aortic aneurysm or dissection. There are foci of aortic atherosclerosis as well as foci great vessel and coronary artery calcification. 2. Areas of somewhat ill-defined airspace opacity in the lower lung regions and inferior lingula, felt to represent multifocal pneumonia. Advise correlation with COVID-19 status check in this regard. No frank consolidation. 3. Small calcified granuloma right upper lobe. Scarring in the apices. 4.  No evident adenopathy. Aortic Atherosclerosis (ICD10-I70.0). Electronically Signed   By: Lowella Grip III M.D.   On: 07/27/2020 11:09   DG Chest Port 1 View  Result Date: 07/27/2020 CLINICAL DATA:  Hypoxia. EXAM: PORTABLE CHEST 1 VIEW COMPARISON:  07/25/2020. FINDINGS: Mediastinum and hilar structures normal. Interim improvement of bilateral interstitial prominence. Low lung volumes with mild bibasilar atelectasis. No pleural effusion or pneumothorax. No acute bony abnormality. IMPRESSION: Interim improvement of bilateral interstitial prominence. Low lung volumes with mild bibasilar atelectasis. Electronically Signed   By: Marcello Moores  Register   On: 07/27/2020 06:04   ECHOCARDIOGRAM COMPLETE  Result Date: 07/27/2020    ECHOCARDIOGRAM REPORT   Patient Name:   GLENDELL SCHLOTTMAN Date of Exam: 07/27/2020 Medical Rec #:  [id number]                 Height:       61.0 in Accession #:    [id number]                Weight:       121.3 lb Date of Birth:  [birth year]                  BSA:          1.527 m Patient Age:    15 years                  BP:           129/56 mmHg Patient Gender: F  HR:           78 bpm. Exam Location:  Inpatient Procedure: 2D Echo,  Cardiac Doppler and Color Doppler Indications:    R55 Syncope  History:        Patient has no prior history of Echocardiogram examinations.                 Signs/Symptoms:Altered Mental Status, Shortness of Breath and                 Dyspnea; Risk Factors:Dyslipidemia. Hypoxia.  Sonographer:    Roseanna Rainbow RDCS Referring Phys: [id number] [place] [facility]  Sonographer Comments: Technically difficult study due to poor echo windows. Image acquisition challenging due to uncooperative patient. Patient confused and would not allow me to turn her for exam. IMPRESSIONS  1. Left ventricular ejection fraction, by estimation, is 60 to 65%. The left ventricle has normal function. The left ventricle has no regional wall motion abnormalities. Left ventricular diastolic parameters are consistent with Grade I diastolic dysfunction (impaired relaxation).  2. Right ventricular systolic function is normal. The right ventricular size is normal.  3. The mitral valve is normal in structure. Trivial mitral valve regurgitation. No evidence of mitral stenosis.  4. The aortic valve has an indeterminant number of cusps. Aortic valve regurgitation is not visualized. No aortic stenosis is present.  5. The inferior vena cava is normal in size with greater than 50% respiratory variability, suggesting right atrial pressure of 3 mmHg. FINDINGS  Left Ventricle: Left ventricular ejection fraction, by estimation, is 60 to 65%. The left ventricle has normal function. The left ventricle has no regional wall motion abnormalities. The left ventricular internal cavity size was normal in size. There is  no left ventricular hypertrophy. Left ventricular diastolic parameters are consistent with Grade I diastolic dysfunction (impaired relaxation). Right Ventricle: The right ventricular size is normal. Right ventricular systolic function is normal. Left Atrium: Left atrial size was normal in size. Right Atrium: Right atrial size was normal in size. Pericardium:  There is no evidence of pericardial effusion. Mitral Valve: The mitral valve is normal in structure. Mild mitral annular calcification. Trivial mitral valve regurgitation. No evidence of mitral valve stenosis. Tricuspid Valve: The tricuspid valve is normal in structure. Tricuspid valve regurgitation is trivial. No evidence of tricuspid stenosis. Aortic Valve: The aortic valve has an indeterminant number of cusps. Aortic valve regurgitation is not visualized. No aortic stenosis is present. Pulmonic Valve: The pulmonic valve was not well visualized. Pulmonic valve regurgitation is not visualized. No evidence of pulmonic stenosis. Aorta: The aortic root is normal in size and structure. Venous: The inferior vena cava is normal in size with greater than 50% respiratory variability, suggesting right atrial pressure of 3 mmHg.   LEFT VENTRICLE PLAX 2D LVIDd:         3.67 cm     Diastology LVIDs:         2.57 cm     LV e' medial:    7.83 cm/s LV PW:         0.96 cm     LV E/e' medial:  13.5 LV IVS:        1.00 cm     LV e' lateral:   8.05 cm/s LVOT diam:     2.10 cm     LV E/e' lateral: 13.2 LV SV:         72 LV SV Index:   47 LVOT Area:     3.46 cm  LV Volumes (MOD) LV vol d, MOD A2C: 42.9 ml LV vol d, MOD A4C: 50.2 ml LV vol s, MOD A2C: 15.9 ml LV vol s, MOD A4C: 20.6 ml LV SV MOD A2C:     27.0 ml LV SV MOD A4C:     50.2 ml LV SV MOD BP:      27.4 ml RIGHT VENTRICLE            IVC RV S prime:     9.79 cm/s  IVC diam: 1.58 cm TAPSE (M-mode): 1.8 cm LEFT ATRIUM             Index       RIGHT ATRIUM           Index LA diam:        3.40 cm 2.23 cm/m  RA Area:     10.40 cm LA Vol (A2C):   30.6 ml 20.04 ml/m RA Volume:   24.10 ml  15.78 ml/m LA Vol (A4C):   23.3 ml 15.26 ml/m LA Biplane Vol: 29.5 ml 19.32 ml/m  AORTIC VALVE LVOT Vmax:   112.00 cm/s LVOT Vmean:  69.900 cm/s LVOT VTI:    0.208 m  AORTA Ao Root diam: 3.00 cm MITRAL VALVE MV Area (PHT): 4.06 cm     SHUNTS MV Decel Time: 187 msec     Systemic VTI:  0.21 m MV  E velocity: 106.00 cm/s  Systemic Diam: 2.10 cm MV A velocity: 107.00 cm/s MV E/A ratio:  0.99 Kirk Ruths MD Electronically signed by Kirk Ruths MD Signature Date/Time: 07/27/2020/11:29:30 AM    Final    VAS [place] LOWER EXTREMITY VENOUS (DVT)  Result Date: 07/27/2020  Lower Venous DVT Study Indications: Elevated Ddimer.  Risk Factors: None identified. Comparison Study: No prior studies. Performing Technologist: Oliver Hum RVT  Examination Guidelines: A complete evaluation includes B-mode imaging, spectral Doppler, color Doppler, and power Doppler as needed of all accessible portions of each vessel. Bilateral testing is considered an integral part of a complete examination. Limited examinations for reoccurring indications may be performed as noted. The reflux portion of the exam is performed with the patient in reverse Trendelenburg.  +---------+---------------+---------+-----------+----------+--------------+ RIGHT    CompressibilityPhasicitySpontaneityPropertiesThrombus Aging +---------+---------------+---------+-----------+----------+--------------+ CFV      Full           Yes      Yes                                 +---------+---------------+---------+-----------+----------+--------------+ SFJ      Full                                                        +---------+---------------+---------+-----------+----------+--------------+ FV Prox  Full                                                        +---------+---------------+---------+-----------+----------+--------------+ FV Mid   Full                                                        +---------+---------------+---------+-----------+----------+--------------+  FV DistalFull                                                        +---------+---------------+---------+-----------+----------+--------------+ PFV      Full                                                         +---------+---------------+---------+-----------+----------+--------------+ POP      Full           Yes      Yes                                 +---------+---------------+---------+-----------+----------+--------------+ PTV      Full                                                        +---------+---------------+---------+-----------+----------+--------------+ PERO     Full                                                        +---------+---------------+---------+-----------+----------+--------------+   +---------+---------------+---------+-----------+----------+--------------+ LEFT     CompressibilityPhasicitySpontaneityPropertiesThrombus Aging +---------+---------------+---------+-----------+----------+--------------+ CFV      Full           Yes      Yes                                 +---------+---------------+---------+-----------+----------+--------------+ SFJ      Full                                                        +---------+---------------+---------+-----------+----------+--------------+ FV Prox  Full                                                        +---------+---------------+---------+-----------+----------+--------------+ FV Mid   Full                                                        +---------+---------------+---------+-----------+----------+--------------+ FV DistalFull                                                        +---------+---------------+---------+-----------+----------+--------------+  PFV      Full                                                        +---------+---------------+---------+-----------+----------+--------------+ POP      Full           Yes      Yes                                 +---------+---------------+---------+-----------+----------+--------------+ PTV      Full                                                         +---------+---------------+---------+-----------+----------+--------------+ PERO     Full                                                        +---------+---------------+---------+-----------+----------+--------------+     Summary: RIGHT: - There is no evidence of deep vein thrombosis in the lower extremity.  - No cystic structure found in the popliteal fossa.  LEFT: - There is no evidence of deep vein thrombosis in the lower extremity.  - No cystic structure found in the popliteal fossa.  *See table(s) above for measurements and observations.    Preliminary    Scheduled Meds: . budesonide (PULMICORT) nebulizer solution  0.25 mg Nebulization BID  . enoxaparin (LOVENOX) injection  40 mg Subcutaneous Q24H  . loratadine  10 mg Oral Daily  . mirtazapine  15 mg Oral QPM  . montelukast  10 mg Oral QPM  . pantoprazole  40 mg Oral Daily  . potassium chloride  40 mEq Oral BID  . pravastatin  40 mg Oral q1800   Continuous Infusions: . sodium chloride 75 mL/hr at 07/26/20 2314  . azithromycin    . cefTRIAXone (ROCEPHIN)  IV 2 g (07/27/20 1628)    LOS: 0 days   Kerney Elbe, DO [facility] PAGER is on [place]  If 7PM-7AM, please contact night-coverage [email or web address]

## 2020-07-27 NOTE — Telephone Encounter (Signed)
Rx for albuterol neb sol has been sent to [place] which is [facility] pharmacy.  Rx for budesonide was sent to pharmacy after pt's OV 07/25/20 by [facility]  Called and spoke with pt's [name] letting her know that the albuterol sol had been sent to [facility] pharmacy and she stated that she wanted it to go to local pharmacy [facility]. Stated to her that I would fix that and send it to [facility] and she verbalized understanding.  Asked [name] how pt was doing and she stated that she had not been able to go to hosp to see pt yet but she said that all labs so far look good and they are talking about possibly discharging pt. Asked her about O2 and she said that she did hear from [facility] about getting O2 delivered to home. Nothing further needed.

## 2020-07-27 NOTE — TOC Initial Note (Signed)
Transition of Care [facility]) - Initial/Assessment Note    Patient Details  Name: Melinda Griffin MRN: [id number] Date of Birth: 1941-01-30  Transition of Care [facility]) CM/SW Contact:    Lynnell Catalan, RN Phone Number: 07/27/2020, 3:14 PM  Clinical Narrative:                 Spoke with pt daughter at length for dc planning. Awaiting PT eval to complete discussion. Daughter with many questions about home health services, SNF and private duty care. TOC will follow.  Expected Discharge Plan: Skilled Nursing Facility Barriers to Discharge: No Barriers Identified   Patient Goals and CMS Choice Patient states their goals for this hospitalization and ongoing recovery are:: Pt is plesantly confused      Expected Discharge Plan and Services Expected Discharge Plan: [place]   Discharge Planning Services: CM Consult   Living arrangements for the past 2 months: Single Family Home                                      Prior Living Arrangements/Services Living arrangements for the past 2 months: Single Family Home Lives with:: Adult Children          Need for Family Participation in Patient Care: Yes (Comment) Care giver support system in place?: Yes (comment)      Activities of Daily Living Home Assistive Devices/Equipment: Hearing aid (implanted hearing aides that have to have a tool to take them out.) ADL Screening (condition at time of admission) Patient's cognitive ability adequate to safely complete daily activities?: No Is the patient deaf or have difficulty hearing?: Yes (has hearing aides that do not come out, only has left one in) Does the patient have difficulty seeing, even when wearing glasses/contacts?: No (hx bilateral lens implants and they adjust to close and far vision) Does the patient have difficulty concentrating, remembering, or making decisions?: Yes Patient able to express need for assistance with ADLs?: No Does the patient have  difficulty dressing or bathing?: Yes Independently performs ADLs?: No Communication: Independent Dressing (OT): Needs assistance Is this a change from baseline?: Pre-admission baseline Grooming: Needs assistance Is this a change from baseline?: Pre-admission baseline Feeding: Independent Bathing: Needs assistance Is this a change from baseline?: Pre-admission baseline Toileting: Needs assistance Is this a change from baseline?: Pre-admission baseline In/Out Bed: Needs assistance Is this a change from baseline?: Pre-admission baseline Walks in Home: Needs assistance Is this a change from baseline?: Pre-admission baseline Does the patient have difficulty walking or climbing stairs?: Yes Weakness of Legs: Both Weakness of Arms/Hands: Both  Permission Sought/Granted                  Emotional Assessment Appearance:: Appears stated age Attitude/Demeanor/Rapport: Charismatic Affect (typically observed): Calm Orientation: : Oriented to Self   Psych Involvement: No (comment)  Admission diagnosis:  Acute respiratory failure with hypoxia ([place]) [J96.01] Community acquired pneumonia, unspecified laterality [J18.9] Patient Active Problem List   Diagnosis Date Noted  . Acute respiratory failure ([place]) 07/25/2020  . 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 Dysphagia 05/26/2020  . Community acquired pneumonia of right lower lobe of lung   . Acute metabolic encephalopathy 94/76/5465  . Weight loss, non-intentional 12/08/2017  . Relationship problem between partners 04/14/2017  . 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 H/O migraine 05/11/2015  . Dupuytren's contracture 07/13/2014  . Encounter for therapeutic drug monitoring 12/31/2013  .  Hyperlipidemia 11/29/2013  . Anxiety 11/29/2013  . Unspecified vitamin D deficiency 11/30/2012  . Depression 11/30/2012  . Mild cognitive impairment with memory loss 11/30/2012  . Hyperglycemia 11/30/2012  . Allergic rhinitis 05/09/2010   PCP:   Seward Carol, MD Pharmacy:   [facility] [place], [place] [address] Phone: [phone] Fax: [phone]  Pharmacy, [place], [place] [address] [place] [address] Phone: [phone] Fax: [phone]     Social Determinants of Health (SDOH) Interventions    Readmission Risk Interventions No flowsheet data found.

## 2020-07-27 NOTE — Progress Notes (Signed)
Bilateral lower extremity venous duplex has been completed. Preliminary results can be found in CV Proc through chart review.   07/27/20 2:22 PM Melinda Griffin RVT

## 2020-07-27 NOTE — NC FL2 (Signed)
[place] LEVEL OF CARE SCREENING TOOL     IDENTIFICATION  Patient Name: Melinda Griffin Birthdate: Nov 16, 1940 Sex: female Admission Date (Current Location): 07/26/2020  [facility] and [place] Number:  Herbalist and Address:  [facility],  [address] [place], [place]      Provider Number: [id number]  Attending Physician Name and Address:  Kerney Elbe, DO  Relative Name and Phone Number:       Current Level of Care: Hospital Recommended Level of Care: [place] Prior Approval Number:    Date Approved/Denied:   PASRR Number:    Discharge Plan: SNF    Current Diagnoses: Patient Active Problem List   Diagnosis Date Noted   Acute respiratory failure ([place]) 07/25/2020   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   Dysphagia 05/26/2020   Community acquired pneumonia of right lower lobe of lung    Acute metabolic encephalopathy 35/45/6256   Weight loss, non-intentional 12/08/2017   Relationship problem between partners 04/14/2017   Senile osteoporosis 03/28/2016   H/O migraine 05/11/2015   Dupuytren's contracture 07/13/2014   Encounter for therapeutic drug monitoring 12/31/2013   Hyperlipidemia 11/29/2013   Anxiety 11/29/2013   Unspecified vitamin D deficiency 11/30/2012   Depression 11/30/2012   Mild cognitive impairment with memory loss 11/30/2012   Hyperglycemia 11/30/2012   Allergic rhinitis 05/09/2010    Orientation RESPIRATION BLADDER Height & Weight     Self  Normal Continent Weight: 55 kg Height:  5\' 1"  (154.9 cm)  BEHAVIORAL SYMPTOMS/MOOD NEUROLOGICAL BOWEL NUTRITION STATUS      Continent Diet (Soft)  AMBULATORY STATUS COMMUNICATION OF NEEDS Skin   Limited Assist Verbally Normal                       Personal Care Assistance Level of Assistance  Bathing, Dressing Bathing Assistance: Limited assistance   Dressing Assistance: Limited assistance      Functional Limitations Info  Sight, Hearing Sight Info: Adequate Hearing Info: Adequate      SPECIAL CARE FACTORS FREQUENCY  PT (By licensed PT), OT (By licensed OT)     PT Frequency: 5 x weekly OT Frequency: 5 x weekly            Contractures Contractures Info: Not present    Additional Factors Info  Code Status, Allergies Code Status Info: DNR Allergies Info: Shrimp, Aspirin, Ceftin,Ciprofloxacin           Current Medications (07/27/2020):  This is the current hospital active medication list Current Facility-Administered Medications  Medication Dose Route Frequency Provider Last Rate Last Admin   0.9 %  sodium chloride infusion   Intravenous Continuous Raiford Noble [place], DO 75 mL/hr at 07/26/20 2314 New Bag at 07/26/20 2314   acetaminophen (TYLENOL) tablet 650 mg  650 mg Oral Q6H PRN Raiford Noble Latif, DO       Or   acetaminophen (TYLENOL) suppository 650 mg  650 mg Rectal Q6H PRN Raiford Noble Latif, DO       azithromycin (ZITHROMAX) 500 mg in sodium chloride 0.9 % 250 mL IVPB  500 mg Intravenous Q24H Sheikh, Omair Latif, DO       bisacodyl (DULCOLAX) suppository 10 mg  10 mg Rectal Daily PRN Sheikh, Omair Latif, DO       budesonide (PULMICORT) nebulizer solution 0.25 mg  0.25 mg Nebulization BID Sheikh, Omair Latif, DO       cefTRIAXone (ROCEPHIN) 2 g in sodium chloride 0.9 %  100 mL IVPB  2 g Intravenous Q24H Sheikh, Omair Latif, DO       enoxaparin (LOVENOX) injection 40 mg  40 mg Subcutaneous Q24H Sheikh, Omair [place], DO       loratadine (CLARITIN) tablet 10 mg  10 mg Oral Daily Raiford Noble [place], [place]   10 mg at 07/27/20 0944   mirtazapine (REMERON) tablet 15 mg  15 mg Oral QPM Sheikh, Omair Latif, DO       montelukast (SINGULAIR) tablet 10 mg  10 mg Oral QPM Sheikh, Omair Latif, DO       OLANZapine zydis (ZYPREXA) disintegrating tablet 2.5 mg  2.5 mg Oral Daily PRN Raiford Noble Latif, DO       ondansetron [facility]) tablet 4 mg  4 mg Oral Q6H PRN  Raiford Noble Latif, DO       Or   ondansetron [facility]) injection 4 mg  4 mg Intravenous Q6H PRN Sheikh, Omair Latif, DO       pantoprazole (PROTONIX) EC tablet 40 mg  40 mg Oral Daily Raiford Noble [place], DO   40 mg at 07/27/20 0944   polyethylene glycol (MIRALAX / GLYCOLAX) packet 17 g  17 g Oral Daily PRN Sheikh, Omair Latif, DO       potassium chloride SA (KLOR-CON) CR tablet 40 mEq  40 mEq Oral BID Raiford Noble [place], DO   40 mEq at 07/27/20 0944   pravastatin (PRAVACHOL) tablet 40 mg  40 mg Oral q1800 Sheikh, Georgina [place] Latif, DO       sodium phosphate (FLEET) 7-19 GM/118ML enema 1 enema  1 enema Rectal Once PRN Kerney Elbe, DO         Discharge Medications: Please see discharge summary for a list of discharge medications.  Relevant Imaging Results:  Relevant Lab Results:   Additional Information ss# [id number]  Elaf Clauson, Marjie Skiff, RN

## 2020-07-28 ENCOUNTER — Inpatient Hospital Stay (HOSPITAL_COMMUNITY): Payer: Medicare Other

## 2020-07-28 DIAGNOSIS — E440 Moderate protein-calorie malnutrition: Secondary | ICD-10-CM | POA: Insufficient documentation

## 2020-07-28 DIAGNOSIS — D72829 Elevated white blood cell count, unspecified: Secondary | ICD-10-CM

## 2020-07-28 DIAGNOSIS — J9601 Acute respiratory failure with hypoxia: Secondary | ICD-10-CM | POA: Diagnosis not present

## 2020-07-28 DIAGNOSIS — E872 Acidosis: Secondary | ICD-10-CM

## 2020-07-28 DIAGNOSIS — J129 Viral pneumonia, unspecified: Secondary | ICD-10-CM | POA: Diagnosis not present

## 2020-07-28 LAB — CBC WITH DIFFERENTIAL/PLATELET
Abs Immature Granulocytes: 0.06 10*3/uL (ref 0.00–0.07)
Basophils Absolute: 0.1 10*3/uL (ref 0.0–0.1)
Basophils Relative: 1 %
Eosinophils Absolute: 0.4 10*3/uL (ref 0.0–0.5)
Eosinophils Relative: 3 %
HCT: 37.2 % (ref 36.0–46.0)
Hemoglobin: 12.4 g/dL (ref 12.0–15.0)
Immature Granulocytes: 0 %
Lymphocytes Relative: 14 %
Lymphs Abs: 2.2 10*3/uL (ref 0.7–4.0)
MCH: 30.8 pg (ref 26.0–34.0)
MCHC: 33.3 g/dL (ref 30.0–36.0)
MCV: 92.3 fL (ref 80.0–100.0)
Monocytes Absolute: 1.4 10*3/uL — ABNORMAL HIGH (ref 0.1–1.0)
Monocytes Relative: 9 %
Neutro Abs: 11.4 10*3/uL — ABNORMAL HIGH (ref 1.7–7.7)
Neutrophils Relative %: 73 %
Platelets: 305 10*3/uL (ref 150–400)
RBC: 4.03 MIL/uL (ref 3.87–5.11)
RDW: 13.2 % (ref 11.5–15.5)
WBC: 15.5 10*3/uL — ABNORMAL HIGH (ref 4.0–10.5)
nRBC: 0 % (ref 0.0–0.2)

## 2020-07-28 LAB — COMPREHENSIVE METABOLIC PANEL
ALT: 16 U/L (ref 0–44)
AST: 19 U/L (ref 15–41)
Albumin: 3 g/dL — ABNORMAL LOW (ref 3.5–5.0)
Alkaline Phosphatase: 60 U/L (ref 38–126)
Anion gap: 10 (ref 5–15)
BUN: <5 mg/dL — ABNORMAL LOW (ref 8–23)
CO2: 21 mmol/L — ABNORMAL LOW (ref 22–32)
Calcium: 8.2 mg/dL — ABNORMAL LOW (ref 8.9–10.3)
Chloride: 106 mmol/L (ref 98–111)
Creatinine, Ser: 0.67 mg/dL (ref 0.44–1.00)
GFR, Estimated: >60 mL/min (ref >60)
Glucose, Bld: 106 mg/dL — ABNORMAL HIGH (ref 70–99)
Potassium: 4 mmol/L (ref 3.5–5.1)
Sodium: 137 mmol/L (ref 135–145)
Total Bilirubin: 0.5 mg/dL (ref 0.3–1.2)
Total Protein: 7 g/dL (ref 6.5–8.1)

## 2020-07-28 LAB — MAGNESIUM: Magnesium: 1.9 mg/dL (ref 1.7–2.4)

## 2020-07-28 LAB — PROCALCITONIN: Procalcitonin: <0.1 ng/mL

## 2020-07-28 LAB — URINE CULTURE

## 2020-07-28 LAB — MRSA PCR SCREENING: MRSA by PCR: NEGATIVE

## 2020-07-28 LAB — GLUCOSE, CAPILLARY: Glucose-Capillary: 107 mg/dL — ABNORMAL HIGH (ref 70–99)

## 2020-07-28 LAB — STREP PNEUMONIAE URINARY ANTIGEN: Strep Pneumo Urinary Antigen: NEGATIVE

## 2020-07-28 LAB — PHOSPHORUS: Phosphorus: 1.8 mg/dL — ABNORMAL LOW (ref 2.5–4.6)

## 2020-07-28 MED ORDER — POTASSIUM PHOSPHATES 15 MMOLE/5ML IV SOLN
20.0000 mmol | Freq: Once | INTRAVENOUS | Status: AC
  Administered 2020-07-28: 20 mmol via INTRAVENOUS
  Filled 2020-07-28: qty 6.67

## 2020-07-28 MED ORDER — ENSURE ENLIVE PO LIQD
237.0000 mL | Freq: Two times a day (BID) | ORAL | Status: DC
  Administered 2020-07-28 – 2020-07-30 (×2): 237 mL via ORAL

## 2020-07-28 MED ORDER — METRONIDAZOLE 500 MG PO TABS
500.0000 mg | ORAL_TABLET | Freq: Three times a day (TID) | ORAL | Status: DC
  Administered 2020-07-28 – 2020-07-31 (×8): 500 mg via ORAL
  Filled 2020-07-28 (×9): qty 1

## 2020-07-28 MED ORDER — SODIUM BICARBONATE 650 MG PO TABS
650.0000 mg | ORAL_TABLET | Freq: Two times a day (BID) | ORAL | Status: AC
  Administered 2020-07-28 (×2): 650 mg via ORAL
  Filled 2020-07-28 (×2): qty 1

## 2020-07-28 NOTE — Progress Notes (Signed)
Initial Nutrition Assessment  DOCUMENTATION CODES:   Non-severe (moderate) malnutrition in context of chronic illness  INTERVENTION:   -Ensure Enlive po BID, each supplement provides 350 kcal and 20 grams of protein  -Magic cup TID with meals, each supplement provides 290 kcal and 9 grams of protein  NUTRITION DIAGNOSIS:   Moderate Malnutrition related to chronic illness (advanced dementia) as evidenced by mild fat depletion, mild muscle depletion, percent weight loss.  GOAL:   Patient will meet greater than or equal to 90% of their needs  MONITOR:   PO intake, Supplement acceptance, Labs, Weight trends, I & O's  REASON FOR ASSESSMENT:   Malnutrition Screening Tool    ASSESSMENT:   79 y.o. female with medical history significant of anxiety, history of asthma, history of hyperlipidemia, GERD, history of migraines as well as other comorbidities as well as advanced dementia who sees a neurologist at [place] who presents with a chief complaint of worsening hypoxia, confusion, agitation.  Patient in room, no family at bedside. Pt very confused and seemed a bit anxious. Pt states she was unable to eat her fish with her lunch d/t it being too hard to eat. Pt did request for RD to open her fruit cup and she plans to eat that. Everything else on her tray was untouched. Pt consumed 30% of lunch yesterday.  Pt is open to receiving Ensure supplements as well, wants vanilla flavor. Unable to provide much nutrition history. Will also send Magic cups as well with meals.  Per review of SLP note from 12/2, pt's daughter reported pt eats too quickly sometimes. It's also difficult to get the pt to eat in the evenings d/t sundowning.  Per weight records, pt has lost 8 lbs since 10/20 (6% wt loss x 1.5 months, significant for time frame).   Medications: Remeron, K Phos  Labs reviewed: CBGs: 107 Low Phos Mg WNL  NUTRITION - FOCUSED PHYSICAL EXAM:    Most Recent Value  Orbital Region Mild  depletion  Upper Arm Region Mild depletion  Thoracic and Lumbar Region Unable to assess  Buccal Region Mild depletion  Temple Region Mild depletion  Clavicle Bone Region Mild depletion  Clavicle and Acromion Bone Region Unable to assess  Scapular Bone Region Unable to assess  Dorsal Hand No depletion  Patellar Region Unable to assess  Anterior Thigh Region Unable to assess  Posterior Calf Region Unable to assess  Edema (RD Assessment) None       Diet Order:   Diet Order            DIET SOFT Room service appropriate? Yes; Fluid consistency: Thin  Diet effective now                 EDUCATION NEEDS:   No education needs have been identified at this time  Skin:  Skin Assessment: Reviewed RN Assessment  Last BM:  12/3 -type 5  Height:   Ht Readings from Last 1 Encounters:  07/26/20 5\' 1"  (1.549 m)    Weight:   Wt Readings from Last 1 Encounters:  07/28/20 57.4 kg    BMI:  Body mass index is 23.91 kg/m.  Estimated Nutritional Needs:   Kcal:  1500-1700  Protein:  65-75g  Fluid:  1.7L/day   Clayton Bibles, MS, RD, LDN Inpatient Clinical Dietitian Contact information available via Amion

## 2020-07-28 NOTE — Progress Notes (Signed)
Hydrologist [facility])  Hospital Liaison: RN note         Notified by [facility] manager of patient/family request for [facility] Palliative services at [facility] after discharge.               [place] Palliative team will follow up with patient after discharge.         Please call with any hospice or palliative related questions.         Thank you for this referral.         Farrel Gordon, RN, CCM  [place] (listed on [place] under Hospice/[facility])    [phone]

## 2020-07-28 NOTE — TOC Progression Note (Addendum)
Transition of Care [facility]) - Progression Note    Patient Details  Name: Melinda Griffin MRN: [id number] Date of Birth: 11/06/1940  Transition of Care [facility]) CM/SW Contact  Jazon Jipson, Marjie Skiff, RN Phone Number: 07/28/2020, 2:51 PM  Clinical Narrative:     [name] was offered choice of SNF. [facility]. Insurance auth started yesterday and was approved today [id number]. [facility] contacted for availability of bed tomorrow. Awaiting [facility] liaison to confirm bed availability. [facility] was also contacted for community palliative services per [name]'s request.  [facility]:  [phone] [id number]  Expected Discharge Plan: [place] Barriers to Discharge: No Barriers Identified  Expected Discharge Plan and Services Expected Discharge Plan: [place]   Discharge Planning Services: CM Consult   Living arrangements for the past 2 months: Single Family Home                      Social Determinants of Health (SDOH) Interventions    Readmission Risk Interventions No flowsheet data found.

## 2020-07-28 NOTE — Progress Notes (Addendum)
Palliative care brief note  I saw and examined Melinda Griffin and discussed with her daughter, [name].  [name] reports understanding her mother's overall condition and she feels very strongly that she needs to have rehab prior to returning home as a plan to return straight home would likely result in continued loss of her functional status.  [name] has been working to get her mother established with outpatient palliative care through [facility].  She requested this be set up as well prior to discharge.  -Recommend transition to skilled facility for rehab with palliative care to follow-up as outpatient.  Full note to follow  Micheline Rough, MD [place] [facility] [phone]

## 2020-07-28 NOTE — Progress Notes (Addendum)
PROGRESS NOTE    Melinda Griffin  QMV:[id number] DOB: Nov 23, 1940 DOA: 07/26/2020 PCP: Seward Carol, MD   Brief Narrative:  Melinda Griffin is a 79 y.o. female with medical history significant of anxiety, history of asthma, history of hyperlipidemia, GERD, history of migraines as well as other comorbidities as well as advanced dementia who sees a neurologist at [facility] who presents with a chief complaint of worsening hypoxia, confusion, agitation.  Patient daughter is at bedside and patient is unable to provide a subjective history at this time given her current condition advanced dementia.  Daughter states that since September she is having multiple recurrent syncopal episodes of unclear etiology.  Syncope was to be worked up in the outpatient setting with her cardiologist and Dr. Eduard Clos ordered an echocardiogram as well as carotid Doppler which were still to be done and were to be done yesterday.  Patient was found to have worsening hypoxia and daughter monitors her oxygen saturation was found to be in the low 80s.  Patient was taken to her pulmonologist who referred her to receive home oxygen DME and have home health arranged.  Patient was discharged back from her pulmonary appointment with supplemental oxygen and was called in albuterol as well as budesonide and doxycycline for possible pneumonia.  Chest x-rays obtained yesterday and shows a possible viral process.  This morning the patient continued become very agitated and kept pulling off her supplemental oxygen via nasal cannula and when she continued be hypoxic EMS was called and EMS came and arrived and found the oxygen canister to be empty.  Patient was transported to [facility] [place] for further evaluation given her hypoxia, confusion and agitation.  Patient has a known history of dementia and daughter states is progressively worsened in the last 4 months or so.  Patient's daughter does also state that she has a cough that is  sometimes productive but most times it is not.  Patient has not had any wheezing but has been more confused and agitated and combative with the daughter.  Because of the constellation of symptoms TRH was asked to admit this patient for acute respiratory failure with hypoxia in the setting of possible viral pneumonia and also because of her recurrent syncope of unknown etiology.  ED Course: In the ED patient had basic blood work done, a EKG done.  Chest x-ray done yesterday.  She was given a 1 L LR bolus and placed on empiric antibiotics with ceftriaxone and azithromycin.  Of note Covid testing was negative and patient received all 3 of her Covid vaccines.  **Interim History  Work-up was essentially negative and CTA was done given her elevated D-dimer and it showed multifocal pneumonia likely viral in origin.  Patient WBC has trended up to 11.5.  We will continue empiric antibiotics for now given pulmonology recommendations.  PT OT recommending SNF.  No evidence of DVT on lower extremity duplex.  Overall her labs remain relatively stable but WBC has been worsening. She was confused the night before last but not last night and was not as agitated. Awaiting placement in SNF and need to obtain Insurance Authorization.  Assessment & Plan:   Active Problems:   Acute respiratory failure (HCC)  Acute Respiratory Failure with Hypoxia in the setting of Likely Viral CAP poA vs Viral Pneumonitis, ruled out PE -Place in Obs Telemetry -Saw Pulmonology the day before yesterday and they obtained CXR on 11/30  -CXR showed "The heart size and mediastinal contours are within normal limits. Increased  prominence of interstitial lung markings is seen throughout both lungs, suspicious for viral or other atypical pneumonitis; interstitial edema is considered less likely. No evidence pulmonary consolidation or pleural effusion." -Order for DME New O2 initiated yesterday but Home Health Arrived with an Empty O2 Cannister and  [place] never showed yesterday -Will Treat CAP Pneumonia and on Abx as below -Check SLP to ensure patient is not aspirating and she is now on a dysphagia 3 diet -WBC was 9.9 and Lactic Acid was 0.9 on admission -Respiratory virus panel was negative -Will continue Empiric Abx for CAP coverage with Azithromycin and Ceftriaxone until we obtain a PCT; -Patient's procalcitonin level was less than 0.10 for the last 3 days -Was given Doxycycline by Pulmonary the PTA and pulmonary recommending continuing doxycycline at discharge for 5 days total; while she was hospitalized B we will continue IV ceftriaxone and I spoke to infectious diseases Dr. Tommy Medal who feels that she does not need IV azithromycin but feels that she may need anaerobic coverage so we will start the azithromycin and start her on p.o. Flagyl -Check Respiratory Virus Panel and Empiric Droplet Precautions -SpO2: 94 % O2 Flow Rate (L/min): 2 L/min -Continuous Pulse Oximetry and Maintain O2 Saturations >90% -Conitinue Supplemental O2 via [place] and wean O2 as tolerated -Singular and Breo-Ellipta were stopped at Last visit -Will start Budesonide 0.25 mg BID and Start Xopenex/Atrovent; Hold Albuterol Inhaler as patient had difficulty comprehending how to use it -Obtained D-dimer is 1.03 -CTA of the chest showed no evidence of PE and there is no thoracic aortic aneurysm or dissection.  There is an area of somewhat ill-defined airspace opacity in the lower lung regions and inferior lingula likely representing multifocal pneumonia -Guaifenesin, Flutter Valve, and Incentive Spirometry -WBC continues to worsen from 9.9 -> 10.9 -> 11.5 -> 15.5 but she has been afebrile and her procalcitonin less than 0.10 but ID feels that she could still potentially have a bacterial infection -We will change antibiotic to IV ceftriaxone and p.o. Flagyl for now and continue to monitor her leukocytosis carefully -Patient sees Dr. Lamonte Sakai as an outpatientand has an  appointment set up for Monday as an outpatient -TOC Consulted for assistance with Home Health but family wants to go to [facility] -Palliative Care to follow at [facility]   Elevated D-Dimer -Extremity duplex negative for VTE -CTA was negative for PE -D-Dimer was elevated to 1.03   Agitation and Worsened Encephalopathy in the setting of Hypoxia superimposed on Chronic Dementia -Given a Liter of LR in the ED  -Monitor overnight and she became agitated so she is in [place] restraints -Check U/A and Urine Cx as well as Blood Cxto r/o UTI and/or Bacteremia -Gentle IVF hydration with NS 75 mL/hr x 1 Day this is can to stop today -Treat possible PNA with Abx as above  -If Encephalopathy Persists will obtain Head CT imaging; Recent Head CT Done 3 weeks ago and showed "No acute intracranial findings.Chronic microvascular angiopathy and parenchymal volume loss" -Likely this is the progression of Dementia -Will consult Palliative Care for Further GOC Discussion and patient to follow-up with palliative care in outpatient setting at SNF -Check TSH, RPR nonreactive, B12was 3633 -Delirium Precautions -C/w Mirtazapine 15 mg po qHS and Citalopram 40 mg po Daily -WILL NOT TRY ATIVAN or HALDOL; If patient becomes agitated will try Olanzapine 2.5 mg po DailyPRN and Soft Mitten Restraints  -Tolerated Olanzapine last night and remained calm   Recurrent Syncope -Unclear Etiology but has been  happening since September  -C/w Telemetry Monitoring; No events noted on telemetry  -Troponin Flat at 3 x2  -Check ECHOCardiogram and showed grade 1 diastolic dysfunction but normal EF -Recent Head CT Done for Syncopal Episode Nov 9th -Check Orthostatic VS to be done -C/w Gentle IVF Hydration with NS at 34mL/hr and will stop today -Check D-Dimer and if warranted CTA of Chest; She has a new Hypoxia and ? Related to a PE given that shes had a Hx of Recurrent Syncope; D-dimer was elevated slightly and CTA as above -PT OT  evaluating and recommending SNF now -Sees Cardiology Dr. Margaretann Loveless as an outpatientand can follow-up at discharge  GERD -C/w Omeprazole 10 mg po BID substitution with Pantoprazole 40 mg po Daily   HLD -C/w Lovastatin 40 mg po qHS subsitution with Pravastatin while hospitalized   Hyperglycemia -Patient's Blood Sugar was 112 on admissio -Continue to Monitor Blood Sugars Carefully; and the blood glucose this morning is 107 on CBG check and repeat blood sugar this a.m. on CMP was 106  Hypophosphatemia -Patient's Phos Level was 1.8 -Replete with IV K Phos 20 mmol -Continue to Monitor and Replete as Necessary -Repeat Phos Level in the AM   Metabolic Acidosis -Mild -IVF now stopped -CO2 is 21, AG is 10, and Chloride Level is 106 -Started Sodium Bicarbonate 650 mg po BID x2 Doses -Continue to Monitor and Trend and Repeat CMP in the AM   Leukocytosis -Mild and slightly worsening  -Patient WBC went from 9.9 -> 10.9 -> 11.5 -> 15.5 -? Related to Budesonide  -She is not complaining of any SOB, Diarrhea, or burning or discomfort  -Check Bladder Scan to ensure she is not retaining  -Continue to monitor and trend and she is getting empiric treatment with antibiotics for a -Urinalysis done and showed a hazy appearance with 20 leukocytes, negative nitrates, rare bacteria, 0-5 squamous epithelial cells, and greater than 50 WBCs -Blood cultures x2 and Showing NGTD x2 days and urine culture still Multiple Species Present  -CTA shows multifocal pneumonia; I spoke with Dr. Drucilla Schmidt who feels that this could still be a bacterial pneumonia so he recommends getting rid of the azithromycin continue IV ceftriaxone and adding anaerobic coverage with p.o. Flagyl -We will continue monitor and trend and repeat CBC in a.m since it continued to further worsen will ask ID to Weigh in and appreciate Dr. Arlyss Queen recommendations of stopping the IV azithromycin and starting p.o. Flagyl and obtain MRSA PCR screen  though he feels that the risk of MRSA is low in this patient and the CT findings are not consistent with MRSA pneumonia  GOC: DNR, poA -Has Advanced Directives -Daughter requests to speak with Palliative Care so will consult and they recommend   DVT prophylaxis: Enoxaparin Code Status: DO NOT RESUSCITATE Family Communication: No family present at bedside and daughter did not pick up the phone Disposition Plan: Anticipate discharging to SNF next 24 to 48 hours  Status is: Inpatient  Remains inpatient appropriate because:Unsafe d/c plan, IV treatments appropriate due to intensity of illness or inability to take PO and Inpatient level of care appropriate due to severity of illness   Dispo: The patient is from: Home              Anticipated d/c is to: SNF              Anticipated d/c date is: 2 days              Patient currently not  medically stable to D/C  Consultants:   Palliative Care Medicine    Procedures:  ECHOCARDIOGRAM IMPRESSIONS    1. Left ventricular ejection fraction, by estimation, is 60 to 65%. The  left ventricle has normal function. The left ventricle has no regional  wall motion abnormalities. Left ventricular diastolic parameters are  consistent with Grade I diastolic  dysfunction (impaired relaxation).  2. Right ventricular systolic function is normal. The right ventricular  size is normal.  3. The mitral valve is normal in structure. Trivial mitral valve  regurgitation. No evidence of mitral stenosis.  4. The aortic valve has an indeterminant number of cusps. Aortic valve  regurgitation is not visualized. No aortic stenosis is present.  5. The inferior vena cava is normal in size with greater than 50%  respiratory variability, suggesting right atrial pressure of 3 mmHg.   FINDINGS  Left Ventricle: Left ventricular ejection fraction, by estimation, is 60  to 65%. The left ventricle has normal function. The left ventricle has no  regional wall  motion abnormalities. The left ventricular internal cavity  size was normal in size. There is  no left ventricular hypertrophy. Left ventricular diastolic parameters  are consistent with Grade I diastolic dysfunction (impaired relaxation).   Right Ventricle: The right ventricular size is normal. Right ventricular  systolic function is normal.   Left Atrium: Left atrial size was normal in size.   Right Atrium: Right atrial size was normal in size.   Pericardium: There is no evidence of pericardial effusion.   Mitral Valve: The mitral valve is normal in structure. Mild mitral annular  calcification. Trivial mitral valve regurgitation. No evidence of mitral  valve stenosis.   Tricuspid Valve: The tricuspid valve is normal in structure. Tricuspid  valve regurgitation is trivial. No evidence of tricuspid stenosis.   Aortic Valve: The aortic valve has an indeterminant number of cusps.  Aortic valve regurgitation is not visualized. No aortic stenosis is  present.   Pulmonic Valve: The pulmonic valve was not well visualized. Pulmonic valve  regurgitation is not visualized. No evidence of pulmonic stenosis.   Aorta: The aortic root is normal in size and structure.   Venous: The inferior vena cava is normal in size with greater than 50%  respiratory variability, suggesting right atrial pressure of 3 mmHg.       LEFT VENTRICLE  PLAX 2D  LVIDd:     3.67 cm   Diastology  LVIDs:     2.57 cm   LV e' medial:  7.83 cm/s  LV PW:     0.96 cm   LV E/e' medial: 13.5  LV IVS:    1.00 cm   LV e' lateral:  8.05 cm/s  LVOT diam:   2.10 cm   LV E/e' lateral: 13.2  LV SV:     72  LV SV Index:  47  LVOT Area:   3.46 cm    LV Volumes (MOD)  LV vol d, MOD A2C: 42.9 ml  LV vol d, MOD A4C: 50.2 ml  LV vol s, MOD A2C: 15.9 ml  LV vol s, MOD A4C: 20.6 ml  LV SV MOD A2C:   27.0 ml  LV SV MOD A4C:   50.2 ml  LV SV MOD BP:   27.4 ml   RIGHT  VENTRICLE      IVC  RV S prime:   9.79 cm/s IVC diam: 1.58 cm  TAPSE (M-mode): 1.8 cm   LEFT ATRIUM  Index    RIGHT ATRIUM      Index  LA diam:    3.40 cm 2.23 cm/m RA Area:   10.40 cm  LA Vol (A2C):  30.6 ml 20.04 ml/m RA Volume:  24.10 ml 15.78 ml/m  LA Vol (A4C):  23.3 ml 15.26 ml/m  LA Biplane Vol: 29.5 ml 19.32 ml/m  AORTIC VALVE  LVOT Vmax:  112.00 cm/s  LVOT Vmean: 69.900 cm/s  LVOT VTI:  0.208 m    AORTA  Ao Root diam: 3.00 cm   MITRAL VALVE  MV Area (PHT): 4.06 cm   SHUNTS  MV Decel Time: 187 msec   Systemic VTI: 0.21 m  MV E velocity: 106.00 cm/s Systemic Diam: 2.10 cm  MV A velocity: 107.00 cm/s  MV E/A ratio: 0.99   LE VENOUS DUPLEX Summary:  RIGHT:  - There is no evidence of deep vein thrombosis in the lower extremity.    - No cystic structure found in the popliteal fossa.    LEFT:  - There is no evidence of deep vein thrombosis in the lower extremity.    - No cystic structure found in the popliteal fossa.    *See table(s) above for measurements and observations.   Antimicrobials: Anti-infectives (From admission, onward)   Start     Dose/Rate Route Frequency Ordered Stop   07/28/20 1445  metroNIDAZOLE (FLAGYL) tablet 500 mg        500 mg Oral Every 8 hours 07/28/20 1352     07/27/20 1800  azithromycin (ZITHROMAX) 500 mg in sodium chloride 0.9 % 250 mL IVPB  Status:  Discontinued        500 mg 250 mL/hr over 60 Minutes Intravenous Every 24 hours 07/26/20 2106 07/28/20 1352   07/27/20 1600  cefTRIAXone (ROCEPHIN) 2 g in sodium chloride 0.9 % 100 mL IVPB        2 g 200 mL/hr over 30 Minutes Intravenous Every 24 hours 07/26/20 2106 07/31/20 1559   07/26/20 1700  cefTRIAXone (ROCEPHIN) 1 g in sodium chloride 0.9 % 100 mL IVPB        1 g 200 mL/hr over 30 Minutes Intravenous  Once 07/26/20 1659 07/26/20 1759   07/26/20 1700  azithromycin (ZITHROMAX) 500 mg in sodium chloride 0.9 % 250 mL IVPB         500 mg 250 mL/hr over 60 Minutes Intravenous  Once 07/26/20 1659 07/26/20 1921        Subjective: Seen and examined at bedside and again remains pleasantly confused and demented and unable to provide a subjective history but she appears calm and comfortable resting.  No nausea or vomiting.  Nursing reports that she was not agitated overnight and rested quite well.  Denies any specific complaints to me.  No family present at bedside.  No other concerns or complaints reported at this time.  Objective: Vitals:   07/28/20 0500 07/28/20 0550 07/28/20 0728 07/28/20 0752  BP:  (!) 149/55    Pulse:  89    Resp:  18    Temp:      TempSrc:      SpO2:  94% 94% 94%  Weight: 57.4 kg     Height:        Intake/Output Summary (Last 24 hours) at 07/28/2020 1357 Last data filed at 07/28/2020 0852 Gross per 24 hour  Intake 2059.01 ml  Output 500 ml  Net 1559.01 ml   Filed Weights   07/26/20 1357 07/26/20 2105 07/28/20 0500  Weight: 58.5 kg 55 kg 57.4 kg   Examination: Physical Exam:  Constitutional: WN/WD chronically ill-appearing elderly Caucasian female currently in no acute distress appears calm and comfortable and is pleasantly confused and demented again Eyes: Lids and conjunctivae normal, sclerae anicteric  ENMT: External Ears, Nose appear normal. Grossly normal hearing.  Neck: Appears normal, supple, no cervical masses, normal ROM, no appreciable thyromegaly; no JVD Respiratory: Diminished to auscultation bilaterally with coarse breath sounds at the bases but no appreciable wheezing, rales, rhonchi or crackles. Normal respiratory effort and patient is not tachypenic. No accessory muscle use.  She is not wearing supplemental oxygen via nasal cannula this morning when I saw her Cardiovascular: RRR, no murmurs / rubs / gallops. S1 and S2 auscultated.  Minimal extremity edema Abdomen: Soft, non-tender, non-distended. Bowel sounds positive.  GU: Deferred. Musculoskeletal: No clubbing /  cyanosis of digits/nails. No joint deformity upper and lower extremities.  Skin: No rashes, lesions, ulcers on limited skin evaluation. No induration; Warm and dry.  Neurologic: CN 2-12 grossly intact with no focal deficits. Romberg sign and cerebellar reflexes not assessed.  Psychiatric: Impaired judgment and insight.  She is awake and alert but not oriented. Normal mood and appropriate affect.   Data Reviewed: I have personally reviewed following labs and imaging studies  CBC: Recent Labs  Lab 07/26/20 1540 07/26/20 2200 07/27/20 0614 07/28/20 0607  WBC 9.9 10.9* 11.5* 15.5*  NEUTROABS 5.5  --   --  11.4*  HGB 13.5 12.3 12.1 12.4  HCT 39.0 37.2 35.9* 37.2  MCV 90.3 92.8 92.5 92.3  PLT 334 312 295 240   Basic Metabolic Panel: Recent Labs  Lab 07/26/20 1540 07/27/20 0614 07/28/20 0607  NA 140 139 137  K 3.8 3.5 4.0  CL 103 106 106  CO2 24 21* 21*  GLUCOSE 112* 98 106*  BUN 19 14 <5*  CREATININE 0.74 0.66 0.67  CALCIUM 9.2 8.4* 8.2*  MG  --  1.7 1.9  PHOS  --  2.6 1.8*   GFR: Estimated Creatinine Clearance: 44.9 mL/min (by C-G formula based on SCr of 0.67 mg/dL). Liver Function Tests: Recent Labs  Lab 07/26/20 1540 07/27/20 0614 07/28/20 0607  AST 18 20 19   ALT 16 16 16   ALKPHOS 69 55 60  BILITOT 0.7 0.5 0.5  PROT 8.1 6.7 7.0  ALBUMIN 3.5 3.0* 3.0*   No results for input(s): LIPASE, AMYLASE in the last 168 hours. No results for input(s): AMMONIA in the last 168 hours. Coagulation Profile: No results for input(s): INR, PROTIME in the last 168 hours. Cardiac Enzymes: No results for input(s): CKTOTAL, CKMB, CKMBINDEX, TROPONINI in the last 168 hours. BNP (last 3 results) No results for input(s): PROBNP in the last 8760 hours. HbA1C: No results for input(s): HGBA1C in the last 72 hours. CBG: Recent Labs  Lab 07/27/20 0533 07/28/20 0749  GLUCAP 102* 107*   Lipid Profile: No results for input(s): CHOL, HDL, LDLCALC, TRIG, CHOLHDL, LDLDIRECT in the last  72 hours. Thyroid Function Tests: Recent Labs    07/27/20 0614  TSH 0.407   Anemia Panel: Recent Labs    07/26/20 2200  VITAMINB12 3,633*   Sepsis Labs: Recent Labs  Lab 07/26/20 1540 07/26/20 2200 07/27/20 0614 07/28/20 0607  PROCALCITON  --  <0.10 <0.10 <0.10  LATICACIDVEN 0.8  --   --   --     Recent Results (from the past 240 hour(s))  Resp Panel by RT-PCR (Flu A&B, Covid) Nasopharyngeal Swab  Status: None   Collection Time: 07/26/20  3:48 PM   Specimen: Nasopharyngeal Swab; Nasopharyngeal(NP) swabs in vial transport medium  Result Value Ref Range Status   SARS Coronavirus 2 by RT PCR NEGATIVE NEGATIVE Final    Comment: (NOTE) SARS-CoV-2 target nucleic acids are NOT DETECTED.  The SARS-CoV-2 RNA is generally detectable in upper respiratory specimens during the acute phase of infection. The lowest concentration of SARS-CoV-2 viral copies this assay can detect is 138 copies/mL. A negative result does not preclude SARS-Cov-2 infection and should not be used as the sole basis for treatment or other patient management decisions. A negative result may occur with  improper specimen collection/handling, submission of specimen other than nasopharyngeal swab, presence of viral mutation(s) within the areas targeted by this assay, and inadequate number of viral copies(<138 copies/mL). A negative result must be combined with clinical observations, patient history, and epidemiological information. The expected result is Negative.  Fact Sheet for Patients:  EntrepreneurPulse.com.au  Fact Sheet for Healthcare Providers:  IncredibleEmployment.be  This test is no t yet approved or cleared by the [place] FDA and  has been authorized for detection and/or diagnosis of SARS-CoV-2 by FDA under an Emergency Use Authorization (EUA). This EUA will remain  in effect (meaning this test can be used) for the duration of the COVID-19  declaration under Section 564(b)(1) of the Act, 21 U.S.C.section 360bbb-3(b)(1), unless the authorization is terminated  or revoked sooner.       Influenza A by PCR NEGATIVE NEGATIVE Final   Influenza B by PCR NEGATIVE NEGATIVE Final    Comment: (NOTE) The Xpert Xpress SARS-CoV-2/FLU/RSV plus assay is intended as an aid in the diagnosis of influenza from Nasopharyngeal swab specimens and should not be used as a sole basis for treatment. Nasal washings and aspirates are unacceptable for Xpert Xpress SARS-CoV-2/FLU/RSV testing.  Fact Sheet for Patients: EntrepreneurPulse.com.au  Fact Sheet for Healthcare Providers: IncredibleEmployment.be  This test is not yet approved or cleared by the [place] FDA and has been authorized for detection and/or diagnosis of SARS-CoV-2 by FDA under an Emergency Use Authorization (EUA). This EUA will remain in effect (meaning this test can be used) for the duration of the COVID-19 declaration under Section 564(b)(1) of the Act, 21 U.S.C. section 360bbb-3(b)(1), unless the authorization is terminated or revoked.  Performed at [facility] [place] [address] [place], [place] [address]   Urine Culture     Status: Abnormal   Collection Time: 07/26/20  5:05 PM   Specimen: Urine, Random  Result Value Ref Range Status   Specimen Description   Final    URINE, RANDOM Performed at [place] [address], [place], [place] [address]    Special Requests   Final    NONE Performed at [facility], [place] [address], [place], [place] [address]    Culture MULTIPLE SPECIES PRESENT, SUGGEST RECOLLECTION (A)  Final   Report Status 07/28/2020 FINAL  Final  Blood culture (routine x 2)     Status: None (Preliminary result)   Collection Time: 07/26/20  5:12 PM   Specimen: BLOOD  Result Value Ref Range Status   Specimen Description   Final    BLOOD LEFT  ANTECUBITAL Performed at [place] [address] [place], [place] [address]    Special Requests   Final    BOTTLES DRAWN AEROBIC AND ANAEROBIC Blood Culture adequate volume Performed at [place] [address] [place],  [place] [address]    Culture   Final    NO GROWTH 2 DAYS Performed at [facility], [place] [address] [place], [place] [address]    Report Status PENDING  Incomplete  Blood culture (routine x 2)     Status: None (Preliminary result)   Collection Time: 07/26/20  5:17 PM   Specimen: BLOOD LEFT FOREARM  Result Value Ref Range Status   Specimen Description   Final    BLOOD LEFT FOREARM Performed at [place] [address] [place], [place] [address]    Special Requests   Final    BOTTLES DRAWN AEROBIC AND ANAEROBIC Blood Culture results may not be optimal due to an inadequate volume of blood received in culture bottles   Culture   Final    NO GROWTH 2 DAYS Performed at [facility], [place] [address] [place], [place] [address]    Report Status PENDING  Incomplete  Respiratory Panel by PCR     Status: None   Collection Time: 07/26/20  6:01 PM   Specimen: Nasopharyngeal Swab; Respiratory  Result Value Ref Range Status   Adenovirus NOT DETECTED NOT DETECTED Final   Coronavirus 229E NOT DETECTED NOT DETECTED Final    Comment: (NOTE) The Coronavirus on the Respiratory Panel, DOES NOT test for the novel  Coronavirus (2019 nCoV)    Coronavirus HKU1 NOT DETECTED NOT DETECTED Final   Coronavirus NL63 NOT DETECTED NOT DETECTED Final   Coronavirus OC43 NOT DETECTED NOT DETECTED Final   Metapneumovirus NOT DETECTED NOT DETECTED Final   Rhinovirus / Enterovirus NOT DETECTED NOT DETECTED Final   Influenza A NOT DETECTED NOT DETECTED Final   Influenza B NOT DETECTED NOT DETECTED Final   Parainfluenza Virus 1 NOT DETECTED NOT DETECTED Final   Parainfluenza Virus 2 NOT DETECTED NOT DETECTED Final    Parainfluenza Virus 3 NOT DETECTED NOT DETECTED Final   Parainfluenza Virus 4 NOT DETECTED NOT DETECTED Final   Respiratory Syncytial Virus NOT DETECTED NOT DETECTED Final   Bordetella pertussis NOT DETECTED NOT DETECTED Final   Chlamydophila pneumoniae NOT DETECTED NOT DETECTED Final   Mycoplasma pneumoniae NOT DETECTED NOT DETECTED Final    Comment: Performed at [facility], [place]. [address], [place], [place] [address]    RN Pressure Injury Documentation:     Estimated body mass index is 23.91 kg/m as calculated from the following:   Height as of this encounter: 5\' 1"  (1.549 m).   Weight as of this encounter: 57.4 kg.  Malnutrition Type:   Malnutrition Characteristics:   Nutrition Interventions:   Radiology Studies: CT ANGIO CHEST PE W OR WO CONTRAST  Result Date: 07/27/2020 CLINICAL DATA:  Hypoxia EXAM: CT ANGIOGRAPHY CHEST WITH CONTRAST TECHNIQUE: Multidetector CT imaging of the chest was performed using the standard protocol during bolus administration of intravenous contrast. Multiplanar CT image reconstructions and MIPs were obtained to evaluate the vascular anatomy. CONTRAST:  54mL OMNIPAQUE IOHEXOL 350 MG/ML SOLN COMPARISON:  Chest radiograph July 27, 2020 FINDINGS: Cardiovascular: There is no demonstrable pulmonary embolus. There is no thoracic aortic aneurysm or dissection. There are scattered foci of calcification in visualized great vessels. There are foci of aortic atherosclerosis as well as foci of coronary artery calcification. There is no pericardial effusion or pericardial thickening. Mediastinum/Nodes: Thyroid appears unremarkable. There is no appreciable thoracic adenopathy. No esophageal lesions are evident. Lungs/Pleura: Small granuloma noted in the right upper lobe. Mild scarring in the extreme apices. There are  ill-defined areas of airspace opacity in each lower lobe region, somewhat more notable on the right than on the left. Small area of opacity in the  inferior lingula, likely a small focus of pneumonia. There is bibasilar atelectasis. No appreciable pleural effusions. Upper Abdomen: Visualized upper abdominal structures appear unremarkable. 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 No blastic or lytic bone lesions. No evident chest wall lesions. Review of the MIP images confirms the above findings. IMPRESSION: 1. No evident pulmonary embolus. No thoracic aortic aneurysm or dissection. There are foci of aortic atherosclerosis as well as foci great vessel and coronary artery calcification. 2. Areas of somewhat ill-defined airspace opacity in the lower lung regions and inferior lingula, felt to represent multifocal pneumonia. Advise correlation with COVID-19 status check in this regard. No frank consolidation. 3. Small calcified granuloma right upper lobe. Scarring in the apices. 4.  No evident adenopathy. Aortic Atherosclerosis (ICD10-I70.0). Electronically Signed   By: Lowella Grip III M.D.   On: 07/27/2020 11:09   DG CHEST PORT 1 VIEW  Result Date: 07/28/2020 CLINICAL DATA:  Shortness of breath. EXAM: PORTABLE CHEST 1 VIEW COMPARISON:  CT 07/27/2020.  Chest x-ray 07/27/2020. FINDINGS: Mediastinum and hilar structures normal. Heart size normal. Persistent mild bibasilar atelectasis and interstitial prominence. No focal alveolar infiltrates. No pleural effusion or pneumothorax. IMPRESSION: Persistent mild bibasilar atelectasis and interstitial prominence. No focal alveolar infiltrate. Chest is unchanged from prior exams. Electronically Signed   By: Marcello Moores  Register   On: 07/28/2020 06:53   DG Chest Port 1 View  Result Date: 07/27/2020 CLINICAL DATA:  Hypoxia. EXAM: PORTABLE CHEST 1 VIEW COMPARISON:  07/25/2020. FINDINGS: Mediastinum and hilar structures normal. Interim improvement of bilateral interstitial prominence. Low lung volumes with mild bibasilar atelectasis. No pleural effusion or pneumothorax. No acute bony abnormality.  IMPRESSION: Interim improvement of bilateral interstitial prominence. Low lung volumes with mild bibasilar atelectasis. Electronically Signed   By: Marcello Moores  Register   On: 07/27/2020 06:04   ECHOCARDIOGRAM COMPLETE  Result Date: 07/27/2020    ECHOCARDIOGRAM REPORT   Patient Name:   Perline HATHCOCK Goerke Date of Exam: 07/27/2020 Medical Rec #:  [id number]                 Height:       61.0 in Accession #:    [id number]                Weight:       121.3 lb Date of Birth:  [birth year]                  BSA:          1.527 m Patient Age:    64 years                  BP:           129/56 mmHg Patient Gender: F                         HR:           78 bpm. Exam Location:  Inpatient Procedure: 2D Echo, Cardiac Doppler and Color Doppler Indications:    R55 Syncope  History:        Patient has no prior history of Echocardiogram examinations.                 Signs/Symptoms:Altered Mental Status, Shortness of Breath and  Dyspnea; Risk Factors:Dyslipidemia. Hypoxia.  Sonographer:    Roseanna Rainbow RDCS Referring Phys: [id number] [facility]  Sonographer Comments: Technically difficult study due to poor echo windows. Image acquisition challenging due to uncooperative patient. Patient confused and would not allow me to turn her for exam. IMPRESSIONS  1. Left ventricular ejection fraction, by estimation, is 60 to 65%. The left ventricle has normal function. The left ventricle has no regional wall motion abnormalities. Left ventricular diastolic parameters are consistent with Grade I diastolic dysfunction (impaired relaxation).  2. Right ventricular systolic function is normal. The right ventricular size is normal.  3. The mitral valve is normal in structure. Trivial mitral valve regurgitation. No evidence of mitral stenosis.  4. The aortic valve has an indeterminant number of cusps. Aortic valve regurgitation is not visualized. No aortic stenosis is present.  5. The inferior vena cava is normal in size with  greater than 50% respiratory variability, suggesting right atrial pressure of 3 mmHg. FINDINGS  Left Ventricle: Left ventricular ejection fraction, by estimation, is 60 to 65%. The left ventricle has normal function. The left ventricle has no regional wall motion abnormalities. The left ventricular internal cavity size was normal in size. There is  no left ventricular hypertrophy. Left ventricular diastolic parameters are consistent with Grade I diastolic dysfunction (impaired relaxation). Right Ventricle: The right ventricular size is normal. Right ventricular systolic function is normal. Left Atrium: Left atrial size was normal in size. Right Atrium: Right atrial size was normal in size. Pericardium: There is no evidence of pericardial effusion. Mitral Valve: The mitral valve is normal in structure. Mild mitral annular calcification. Trivial mitral valve regurgitation. No evidence of mitral valve stenosis. Tricuspid Valve: The tricuspid valve is normal in structure. Tricuspid valve regurgitation is trivial. No evidence of tricuspid stenosis. Aortic Valve: The aortic valve has an indeterminant number of cusps. Aortic valve regurgitation is not visualized. No aortic stenosis is present. Pulmonic Valve: The pulmonic valve was not well visualized. Pulmonic valve regurgitation is not visualized. No evidence of pulmonic stenosis. Aorta: The aortic root is normal in size and structure. Venous: The inferior vena cava is normal in size with greater than 50% respiratory variability, suggesting right atrial pressure of 3 mmHg.   LEFT VENTRICLE PLAX 2D LVIDd:         3.67 cm     Diastology LVIDs:         2.57 cm     LV e' medial:    7.83 cm/s LV PW:         0.96 cm     LV E/e' medial:  13.5 LV IVS:        1.00 cm     LV e' lateral:   8.05 cm/s LVOT diam:     2.10 cm     LV E/e' lateral: 13.2 LV SV:         72 LV SV Index:   47 LVOT Area:     3.46 cm  LV Volumes (MOD) LV vol d, MOD A2C: 42.9 ml LV vol d, MOD A4C: 50.2 ml LV  vol s, MOD A2C: 15.9 ml LV vol s, MOD A4C: 20.6 ml LV SV MOD A2C:     27.0 ml LV SV MOD A4C:     50.2 ml LV SV MOD BP:      27.4 ml RIGHT VENTRICLE            IVC RV S prime:     9.79 cm/s  IVC  diam: 1.58 cm TAPSE (M-mode): 1.8 cm LEFT ATRIUM             Index       RIGHT ATRIUM           Index LA diam:        3.40 cm 2.23 cm/m  RA Area:     10.40 cm LA Vol (A2C):   30.6 ml 20.04 ml/m RA Volume:   24.10 ml  15.78 ml/m LA Vol (A4C):   23.3 ml 15.26 ml/m LA Biplane Vol: 29.5 ml 19.32 ml/m  AORTIC VALVE LVOT Vmax:   112.00 cm/s LVOT Vmean:  69.900 cm/s LVOT VTI:    0.208 m  AORTA Ao Root diam: 3.00 cm MITRAL VALVE MV Area (PHT): 4.06 cm     SHUNTS MV Decel Time: 187 msec     Systemic VTI:  0.21 m MV E velocity: 106.00 cm/s  Systemic Diam: 2.10 cm MV A velocity: 107.00 cm/s MV E/A ratio:  0.99 Kirk Ruths MD Electronically signed by Kirk Ruths MD Signature Date/Time: 07/27/2020/11:29:30 AM    Final    VAS [place] LOWER EXTREMITY VENOUS (DVT)  Result Date: 07/28/2020  Lower Venous DVT Study Indications: Elevated Ddimer.  Risk Factors: None identified. Comparison Study: No prior studies. Performing Technologist: Oliver Hum RVT  Examination Guidelines: A complete evaluation includes B-mode imaging, spectral Doppler, color Doppler, and power Doppler as needed of all accessible portions of each vessel. Bilateral testing is considered an integral part of a complete examination. Limited examinations for reoccurring indications may be performed as noted. The reflux portion of the exam is performed with the patient in reverse Trendelenburg.  +---------+---------------+---------+-----------+----------+--------------+ RIGHT    CompressibilityPhasicitySpontaneityPropertiesThrombus Aging +---------+---------------+---------+-----------+----------+--------------+ CFV      Full           Yes      Yes                                  +---------+---------------+---------+-----------+----------+--------------+ SFJ      Full                                                        +---------+---------------+---------+-----------+----------+--------------+ FV Prox  Full                                                        +---------+---------------+---------+-----------+----------+--------------+ FV Mid   Full                                                        +---------+---------------+---------+-----------+----------+--------------+ FV DistalFull                                                        +---------+---------------+---------+-----------+----------+--------------+ PFV      Full                                                        +---------+---------------+---------+-----------+----------+--------------+  POP      Full           Yes      Yes                                 +---------+---------------+---------+-----------+----------+--------------+ PTV      Full                                                        +---------+---------------+---------+-----------+----------+--------------+ PERO     Full                                                        +---------+---------------+---------+-----------+----------+--------------+   +---------+---------------+---------+-----------+----------+--------------+ LEFT     CompressibilityPhasicitySpontaneityPropertiesThrombus Aging +---------+---------------+---------+-----------+----------+--------------+ CFV      Full           Yes      Yes                                 +---------+---------------+---------+-----------+----------+--------------+ SFJ      Full                                                        +---------+---------------+---------+-----------+----------+--------------+ FV Prox  Full                                                         +---------+---------------+---------+-----------+----------+--------------+ FV Mid   Full                                                        +---------+---------------+---------+-----------+----------+--------------+ FV DistalFull                                                        +---------+---------------+---------+-----------+----------+--------------+ PFV      Full                                                        +---------+---------------+---------+-----------+----------+--------------+ POP      Full           Yes      Yes                                 +---------+---------------+---------+-----------+----------+--------------+  PTV      Full                                                        +---------+---------------+---------+-----------+----------+--------------+ PERO     Full                                                        +---------+---------------+---------+-----------+----------+--------------+     Summary: RIGHT: - There is no evidence of deep vein thrombosis in the lower extremity.  - No cystic structure found in the popliteal fossa.  LEFT: - There is no evidence of deep vein thrombosis in the lower extremity.  - No cystic structure found in the popliteal fossa.  *See table(s) above for measurements and observations. Electronically signed by Jamelle Haring on 07/28/2020 at 1:45:40 PM.    Final    Scheduled Meds: . budesonide (PULMICORT) nebulizer solution  0.25 mg Nebulization BID  . enoxaparin (LOVENOX) injection  40 mg Subcutaneous Q24H  . feeding supplement  237 mL Oral BID BM  . loratadine  10 mg Oral Daily  . metroNIDAZOLE  500 mg Oral Q8H  . mirtazapine  15 mg Oral QPM  . montelukast  10 mg Oral QPM  . pantoprazole  40 mg Oral Daily  . pravastatin  40 mg Oral q1800  . sodium bicarbonate  650 mg Oral BID   Continuous Infusions: . cefTRIAXone (ROCEPHIN)  IV Stopped (07/27/20 1658)  . potassium PHOSPHATE IVPB (in mmol)  20 mmol (07/28/20 1108)    LOS: 1 day   Kerney Elbe, DO [facility] PAGER is on [place]  If 7PM-7AM, please contact night-coverage [email or web address]

## 2020-07-29 DIAGNOSIS — J9601 Acute respiratory failure with hypoxia: Secondary | ICD-10-CM | POA: Diagnosis not present

## 2020-07-29 LAB — MAGNESIUM: Magnesium: 2 mg/dL (ref 1.7–2.4)

## 2020-07-29 LAB — CBC WITH DIFFERENTIAL/PLATELET
Abs Immature Granulocytes: 0.05 10*3/uL (ref 0.00–0.07)
Basophils Absolute: 0.1 10*3/uL (ref 0.0–0.1)
Basophils Relative: 1 %
Eosinophils Absolute: 0.8 10*3/uL — ABNORMAL HIGH (ref 0.0–0.5)
Eosinophils Relative: 7 %
HCT: 35.5 % — ABNORMAL LOW (ref 36.0–46.0)
Hemoglobin: 11.9 g/dL — ABNORMAL LOW (ref 12.0–15.0)
Immature Granulocytes: 1 %
Lymphocytes Relative: 20 %
Lymphs Abs: 2.2 10*3/uL (ref 0.7–4.0)
MCH: 30.5 pg (ref 26.0–34.0)
MCHC: 33.5 g/dL (ref 30.0–36.0)
MCV: 91 fL (ref 80.0–100.0)
Monocytes Absolute: 1 10*3/uL (ref 0.1–1.0)
Monocytes Relative: 10 %
Neutro Abs: 6.5 10*3/uL (ref 1.7–7.7)
Neutrophils Relative %: 61 %
Platelets: 304 10*3/uL (ref 150–400)
RBC: 3.9 MIL/uL (ref 3.87–5.11)
RDW: 13.2 % (ref 11.5–15.5)
WBC: 10.6 10*3/uL — ABNORMAL HIGH (ref 4.0–10.5)
nRBC: 0 % (ref 0.0–0.2)

## 2020-07-29 LAB — COMPREHENSIVE METABOLIC PANEL
ALT: 15 U/L (ref 0–44)
AST: 18 U/L (ref 15–41)
Albumin: 2.9 g/dL — ABNORMAL LOW (ref 3.5–5.0)
Alkaline Phosphatase: 64 U/L (ref 38–126)
Anion gap: 12 (ref 5–15)
BUN: 7 mg/dL — ABNORMAL LOW (ref 8–23)
CO2: 23 mmol/L (ref 22–32)
Calcium: 8.3 mg/dL — ABNORMAL LOW (ref 8.9–10.3)
Chloride: 103 mmol/L (ref 98–111)
Creatinine, Ser: 0.57 mg/dL (ref 0.44–1.00)
GFR, Estimated: >60 mL/min (ref >60)
Glucose, Bld: 114 mg/dL — ABNORMAL HIGH (ref 70–99)
Potassium: 3.6 mmol/L (ref 3.5–5.1)
Sodium: 138 mmol/L (ref 135–145)
Total Bilirubin: 0.3 mg/dL (ref 0.3–1.2)
Total Protein: 7 g/dL (ref 6.5–8.1)

## 2020-07-29 LAB — PHOSPHORUS: Phosphorus: 2.4 mg/dL — ABNORMAL LOW (ref 2.5–4.6)

## 2020-07-29 LAB — GLUCOSE, CAPILLARY: Glucose-Capillary: 117 mg/dL — ABNORMAL HIGH (ref 70–99)

## 2020-07-29 MED ORDER — SODIUM CHLORIDE 0.9 % IV BOLUS
500.0000 mL | Freq: Once | INTRAVENOUS | Status: AC
  Administered 2020-07-29: 500 mL via INTRAVENOUS

## 2020-07-29 NOTE — Consult Note (Signed)
Consultation Note Date: 07/29/2020   Patient Name: Melinda Griffin  DOB: 07-24-1941  MRN: [id number]  Age / Sex: 79 y.o., female  PCP: Seward Carol, MD Referring Physician: Jonnie Finner, DO  Reason for Consultation: Establishing goals of care  HPI/Patient Profile: 79 y.o. female  with past medical history of anxiety, asthma, hyperlipidemia, GERD, migraines and advanced dementia who follows with neurologist at [facility] who presented with worsening hypoxia, confusion, and agitation.  She was admitted with respiratory failure with hypoxia in the setting of viral pneumonia versus viral pneumonitis.  Her daughter requested palliative care consult to discuss long-term goals of care moving forward.   Clinical Assessment and Goals of Care: I saw and examined Ms. Melinda Griffin.  She was awake and alert but not really able to participate in conversation regarding goals of care as she does not really have insight into her overall condition.  I called and was able to reach her daughter, [name].  We discussed her mother's clinical course with continued decline in her cognition and functional status as her dementia has progressed.  Discussed wishes regarding care plan this hospitalization and after discharge.  Concepts specific to code status and rehospitalization discussed.  We discussed difference between a aggressive medical intervention path and a palliative, comfort focused care path.  Values and goals of care important to patient and family were attempted to be elicited.  Patient's daughter understands her overall condition and that she will continue to decline in her nutrition, cognition, and functional status moving forward.  Her goal is to maximize her mother's quality of life and allow her mother to live as well as possible for as long as possible.  She has been working to get as much support for her mother as  possible as an outpatient.  She has been in discussion with [facility] to have her followed by outpatient palliative care.  We discussed desire for rehab at time of discharge with a goal of maximizing her functional status prior to her eventual return home.  Questions and concerns addressed.     SUMMARY OF RECOMMENDATIONS   -DNR/DNI -Plan to pursue rehab prior to returning home as she would likely continue to decline without further strengthening and improvement in functional status prior to returning home. -Recommend outpatient care to follow.  She has been working to get established with outpatient care through [facility]  We will work to set this up prior to discharge.  Code Status/Advance Care Planning:  DNR  Prognosis:   Unable to determine  Discharge Planning: [place] for rehab with Palliative care service follow-up      Primary Diagnoses: Present on Admission: . Acute respiratory failure ([place])   I have reviewed the medical record, interviewed the patient and family, and examined the patient. The following aspects are pertinent.  Past Medical History:  Diagnosis Date  . Allergic rhinitis   . Anxiety   . Asthma   . Hyperlipidemia   . Migraine with aura, without mention of intractable migraine without mention of status  migrainosus    Social History   Socioeconomic History  . Marital status: Divorced    Spouse name: [name]  . Number of children: 2  . Years of education: college   . Highest education level: Not on file  Occupational History  . Not on file  Tobacco Use  . Smoking status: Never Smoker  . Smokeless tobacco: Never Used  . Tobacco comment: smoked  briefly in early 60's less than a year  Vaping Use  . Vaping Use: Never used  Substance and Sexual Activity  . Alcohol use: No    Alcohol/week: 0.0 standard drinks  . Drug use: No  . Sexual activity: Not on file  Other Topics Concern  . 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 She previously lived in [place]. Previously has traveled to [place], [place], [place], [place], [place], [place], [place], [place], [place], [place], & [place]. She has traveled all the way up the [facility] to [place]. Hasn't traveled much in the [place] except for [place]. Prior travel to [place] & [place]. She currently has 2 dogs & 1 cat. No bird exposure. Previously worked as a Freight forwarder in [employer]. No mold or hot tub exposure. She does have a swimming pool & coy pond.    Social Determinants of Health   Financial Resource Strain:   . Difficulty of Paying Living Expenses: Not on file  Food Insecurity:   . Worried About Charity fundraiser in the Last Year: Not on file  . Ran Out of Food in the Last Year: Not on file  Transportation Needs:   . Lack of Transportation (Medical): Not on file  . Lack of Transportation (Non-Medical): Not on file  Physical Activity:   . Days of Exercise per Week: Not on file  . Minutes of Exercise per Session: Not on file  Stress:   . Feeling of Stress : Not on file  Social Connections:   . Frequency of Communication with Friends and Family: Not on file  . Frequency of Social Gatherings with Friends and Family: Not on file  . Attends Religious Services: Not on file  . Active Member of Clubs or Organizations: Not on file  . Attends Archivist Meetings: Not on file  . Marital Status: Not on file   Family History  Problem Relation Age of Onset  . Asthma Father   . Asthma Sister   . Heart disease Sister   . Cancer Mother        brain, breast  . Cancer Brother 63  . Heart disease Brother   . Hypertension Brother    Scheduled Meds: . budesonide (PULMICORT) nebulizer solution  0.25 mg Nebulization BID  . enoxaparin (LOVENOX) injection  40 mg Subcutaneous Q24H  . feeding supplement  237 mL Oral BID BM  . loratadine  10 mg Oral Daily    . metroNIDAZOLE  500 mg Oral Q8H  . mirtazapine  15 mg Oral QPM  . montelukast  10 mg Oral QPM  . pantoprazole  40 mg Oral Daily  . pravastatin  40 mg Oral q1800   Continuous Infusions: . cefTRIAXone (ROCEPHIN)  IV 2 g (07/29/20 1742)   PRN Meds:.acetaminophen **OR** acetaminophen, bisacodyl, OLANZapine zydis, ondansetron **OR** ondansetron (ZOFRAN) IV, polyethylene glycol,  sodium phosphate Medications Prior to Admission:  Prior to Admission medications   Medication Sig Start Date End Date Taking? Authorizing Provider  albuterol (VENTOLIN HFA) 108 (90 Base) MCG/ACT inhaler Inhale 1-2 puffs into the lungs every 6 (six) hours as needed for wheezing or shortness of breath.  02/21/20  Yes [provider]  budesonide (PULMICORT) 0.25 MG/2ML nebulizer solution Take 2 mLs (0.25 mg total) by nebulization 2 (two) times daily. Partial refill ok 07/25/20  Yes Martyn Ehrich, NP  Calcium Carb-Cholecalciferol (CALCIUM 500 + D) 500-200 MG-UNIT TABS Take 1 tablet by mouth daily.   Yes [provider]  cetirizine (ZYRTEC) 10 MG tablet Take 10 mg by mouth daily. Take one tablet once daily for allergies    Yes [provider]  citalopram (CELEXA) 40 MG tablet TAKE 1 TABLET BY MOUTH ONCE DAILY Patient taking differently: Take 40 mg by mouth daily.  05/04/18  Yes Reed, Tiffany L, DO  doxycycline (VIBRA-TABS) 100 MG tablet Take 1 tablet (100 mg total) by mouth 2 (two) times daily. 07/25/20  Yes Martyn Ehrich, NP  lovastatin (MEVACOR) 40 MG tablet TAKE 1 TABLET BY MOUTH EVERY DAY TO LOWER CHOLESTEROL Patient taking differently: Take 40 mg by mouth every evening.  07/03/18  Yes Reed, Tiffany L, DO  Melatonin 10 MG TABS Take 10 mg by mouth at bedtime as needed (sleep).   Yes [provider]  mirtazapine (REMERON) 15 MG tablet Take 15 mg by mouth every evening.  02/24/20  Yes [provider]  montelukast (SINGULAIR) 10 MG tablet TAKE 1 TABLET BY MOUTH EVERY  DAY Patient taking differently: Take 10 mg by mouth every evening.  12/01/18  Yes Reed, Tiffany L, DO  omeprazole (PRILOSEC) 10 MG capsule Take 10 mg by mouth in the morning and at bedtime.   Yes [provider]  albuterol (PROVENTIL) (2.5 MG/3ML) 0.083% nebulizer solution Take 3 mLs (2.5 mg total) by nebulization every 6 (six) hours as needed for wheezing or shortness of breath. 07/27/20   Martyn Ehrich, NP   Allergies  Allergen Reactions  . 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 Aspirin     Advised by MD previously not to take  . Ceftin     Dizzy & Diarrhea  . Ciprofloxacin Hcl     Dizzy, diarrhea   Review of Systems  Denies complaints but unreliable historian  Physical Exam General: Alert, awake, in no acute distress. Chronically ill-appearing female HEENT: No bruits, no goiter, no JVD Heart: Regular rate and rhythm. No murmur appreciated. Lungs: Decreased air movement, scattered coarse in bases Abdomen: Soft, nontender, nondistended, positive bowel sounds.  Ext: No significant edema Skin: Warm and dry Neuro: Grossly intact, nonfocal.   Vital Signs: BP (!) 109/56 (BP Location: Right Arm)   Pulse 81   Temp 98.2 F (36.8 C) (Oral)   Resp 15   Ht 5\' 1"  (1.549 m)   Wt 57.4 kg   SpO2 92%   BMI 23.91 kg/m  Pain Scale: 0-10 POSS *See Group Information*: 1-Acceptable,Awake and alert Pain Score: 0-No pain   SpO2: SpO2: 92 % O2 Device:SpO2: 92 % O2 Flow Rate: .O2 Flow Rate (L/min): 2 L/min  IO: Intake/output summary:   Intake/Output Summary (Last 24 hours) at 07/29/2020 1841 Last data filed at 07/29/2020 1523 [place] per 24 hour  Intake --  Output 800 ml  Net -800 ml    LBM: Last BM Date: 07/28/20 Baseline Weight: Weight: 58.5 kg Most recent weight: Weight:  57.4 kg     Palliative Assessment/Data:   Flowsheet Rows     Most Recent Value  Intake Tab  Referral Department Hospitalist  Date Notified 07/26/20  Palliative Care Type New Palliative care   Reason for referral Clarify Goals of Care  Date of Admission 07/26/20  Date first seen by Palliative Care 07/27/20  # of days Palliative referral response time 1 Day(s)  # of days IP prior to Palliative referral 0  Clinical Assessment  Palliative Performance Scale Score 40%  Psychosocial & Spiritual Assessment  Palliative Care Outcomes  Patient/Family meeting held? Yes  Who was at the meeting? Daughter via phone      Time In: 1715 Time Out: 1815 Time Total: 60 Greater than 50%  of this time was spent counseling and coordinating care related to the above assessment and plan.  Signed by: Micheline Rough, MD   Please contact Palliative Medicine Team phone at [phone] for questions and concerns.  For individual provider: See Shea Evans

## 2020-07-29 NOTE — TOC Progression Note (Signed)
Transition of Care [facility]) - Progression Note    Patient Details  Name: Money Mckeithan MRN: [id number] Date of Birth: 01-30-1941  Transition [facility] CM/SW Contact  Ross Ludwig, [place] Phone Number: 07/29/2020, 2:48 PM  Clinical Narrative:     CSW was informed that patient's daughter had some questions about going to SNF.  CSW called patient's [name], she asked if patient had been approved for [facility] placement yet.  CSW informed her that per notes from yesterday patient was approved.  CSW to continue to follow patient's progress throughout discharge planning.   Expected Discharge Plan: [place] Barriers to Discharge: No Barriers Identified  Expected Discharge Plan and Services Expected Discharge Plan: [place]   Discharge Planning Services: CM Consult   Living arrangements for the past 2 months: Single Family Home                                       Social Determinants of Health (SDOH) Interventions    Readmission Risk Interventions No flowsheet data found.

## 2020-07-29 NOTE — Progress Notes (Signed)
Patient has not voided. Bladder scan highest reading is 96. NP notified.

## 2020-07-29 NOTE — Progress Notes (Signed)
PROGRESS NOTE    Melinda Griffin  LPF:[id number] DOB: 11-28-1940 DOA: 07/26/2020 PCP: Seward Carol, MD   Brief Narrative:   Melinda Every Weathersbeeis a 79 y.o.femalewith medical history significant ofanxiety, history of asthma, history of hyperlipidemia, GERD, history of migraines as well as other comorbidities as well as advanced dementia who sees a neurologist at [facility] who presents with a chief complaint of worsening hypoxia, confusion, agitation.   12/4: Nursing reports no UOP ON. Bladder scan initially low, but second scan 200+cc. Order I&O and fluid challenge. Follow UOP. D/c to SNF tomorrow.    Assessment & Plan:  Acute Respiratory Failure with Hypoxia in the setting of Likely Viral CAP poA vs Viral Pneumonitis, ruled out PE     - CXR showed "The heart size and mediastinal contours are within normal limits. Increased prominence of interstitial lung markings is seen throughout both lungs, suspicious for viral or other atypical pneumonitis; interstitial edema is considered less likely. No evidence pulmonary consolidation or pleural effusion."     - After discussion with ID, abx regimen is rocephin/flagyl     - RVP negative     - pulmicort, singular     - D-dimer is 1.03     - CTA of the chest showed no evidence of PE and there is no thoracic aortic aneurysm or dissection.  There is an area of somewhat ill-defined airspace opacity in the lower lung regions and inferior lingula likely representing multifocal pneumonia     - Patient sees Dr. Lamonte Sakai as an outpatientand has an appointment set up for Monday as an outpatient     - TOC Consulted for assistance with Home Health but [facility]     - [facility] to [facility] at [facility]   Elevated D-Dimer     - see above  Agitation and Worsened Encephalopathy in the setting of Hypoxia superimposed on Chronic Dementia     - improved     - likely this is the progression of Dementia     - pt to follow-up with  palliative care in outpatient setting at SNF     - Check TSH, RPR nonreactive, B12was 3633     - Delirium Precautions     - c/w Mirtazapine 15 mg po qHS and Citalopram 40 mg po Daily     - WILL NOT TRY ATIVAN or HALDOL; If patient becomes agitated will try Olanzapine 2.5 mg po DailyPRN and Soft Mitten Restraints  Recurrent Syncope     - Unclear Etiology but has been happening since September      - c/w Telemetry Monitoring; No events noted on telemetry     - pressures ok     -Echo showed grade 1 diastolic dysfunction but normal EF  GERD     - PPI  HLD     - statin  Hyperglycemia     - resolved  Hypophosphatemia     - improved, follow  Metabolic Acidosis     - resolved, follow  Decreased UOP     - fluid challenge, I&O cath, follow  DVT prophylaxis: lovenox Code Status: DNR Family Communication: Spoke with dtr by   Status is: Inpatient  Remains inpatient appropriate because:Inpatient level of care appropriate due to severity of illness   Dispo: The patient is from: Home              Anticipated d/c is to: SNF  Anticipated d/c date is: 1 day              Patient currently is not medically stable to d/c.  Antimicrobials:  . rocephin   Subjective: No UOP ON per nursing.  Objective: Vitals:   07/28/20 1936 07/28/20 2129 07/29/20 0653 07/29/20 1633  BP:  131/60 129/65 (!) 109/56  Pulse:  82 80 81  Resp:  18 18 15   Temp:   97.9 F (36.6 C) 98.2 F (36.8 C)  TempSrc:   Oral Oral  SpO2: 93% 94% 97% 92%  Weight:      Height:        Intake/Output Summary (Last 24 hours) at 07/29/2020 1844 Last data filed at 07/29/2020 1523 Gross per 24 hour  Intake --  Output 800 ml  Net -800 ml   Filed Weights   07/26/20 1357 07/26/20 2105 07/28/20 0500  Weight: 58.5 kg 55 kg 57.4 kg    Examination:  General: 79 y.o. female resting in bed in NAD Eyes: PERRL, normal sclera ENMT: Nares patent w/o discharge, orophaynx clear, dentition normal, ears  w/o discharge/lesions/ulcers Neck: Supple, trachea midline Cardiovascular: RRR, +S1, S2, no m/g/r, equal pulses throughout Respiratory: course sounds possibly UAT,  normal WOB on RA GI: BS+, NDNT, no masses noted, no organomegaly noted MSK: No e/c/c Skin: No rashes, bruises, ulcerations noted Neuro: somnolent   Data Reviewed: I have personally reviewed following labs and imaging studies.  CBC: Recent Labs  Lab 07/26/20 1540 07/26/20 2200 07/27/20 0614 07/28/20 0607 07/29/20 0723  WBC 9.9 10.9* 11.5* 15.5* 10.6*  NEUTROABS 5.5  --   --  11.4* 6.5  HGB 13.5 12.3 12.1 12.4 11.9*  HCT 39.0 37.2 35.9* 37.2 35.5*  MCV 90.3 92.8 92.5 92.3 91.0  PLT 334 312 295 305 967   Basic Metabolic Panel: Recent Labs  Lab 07/26/20 1540 07/27/20 0614 07/28/20 0607 07/29/20 0723  NA 140 139 137 138  K 3.8 3.5 4.0 3.6  CL 103 106 106 103  CO2 24 21* 21* 23  GLUCOSE 112* 98 106* 114*  BUN 19 14 <5* 7*  CREATININE 0.74 0.66 0.67 0.57  CALCIUM 9.2 8.4* 8.2* 8.3*  MG  --  1.7 1.9 2.0  PHOS  --  2.6 1.8* 2.4*   GFR: Estimated Creatinine Clearance: 44.9 mL/min (by C-G formula based on SCr of 0.57 mg/dL). Liver Function Tests: Recent Labs  Lab 07/26/20 1540 07/27/20 0614 07/28/20 0607 07/29/20 0723  AST 18 20 19 18   ALT 16 16 16 15   ALKPHOS 69 55 60 64  BILITOT 0.7 0.5 0.5 0.3  PROT 8.1 6.7 7.0 7.0  ALBUMIN 3.5 3.0* 3.0* 2.9*   No results for input(s): LIPASE, AMYLASE in the last 168 hours. No results for input(s): AMMONIA in the last 168 hours. Coagulation Profile: No results for input(s): INR, PROTIME in the last 168 hours. Cardiac Enzymes: No results for input(s): CKTOTAL, CKMB, CKMBINDEX, TROPONINI in the last 168 hours. BNP (last 3 results) No results for input(s): PROBNP in the last 8760 hours. HbA1C: No results for input(s): HGBA1C in the last 72 hours. CBG: Recent Labs  Lab 07/27/20 0533 07/28/20 0749 07/29/20 0745  GLUCAP 102* 107* 117*   Lipid Profile: No  results for input(s): CHOL, HDL, LDLCALC, TRIG, CHOLHDL, LDLDIRECT in the last 72 hours. Thyroid Function Tests: Recent Labs    07/27/20 0614  TSH 0.407   Anemia Panel: Recent Labs    07/26/20 2200  VITAMINB12 3,633*   Sepsis  Labs: Recent Labs  Lab 07/26/20 1540 07/26/20 2200 07/27/20 0614 07/28/20 0607  PROCALCITON  --  <0.10 <0.10 <0.10  LATICACIDVEN 0.8  --   --   --     Recent Results (from the past 240 hour(s))  Resp Panel by RT-PCR (Flu A&B, Covid) Nasopharyngeal Swab     Status: None   Collection Time: 07/26/20  3:48 PM   Specimen: Nasopharyngeal Swab; Nasopharyngeal(NP) swabs in vial transport medium  Result Value Ref Range Status   SARS Coronavirus 2 by RT PCR NEGATIVE NEGATIVE Final    Comment: (NOTE) SARS-CoV-2 target nucleic acids are NOT DETECTED.  The SARS-CoV-2 RNA is generally detectable in upper respiratory specimens during the acute phase of infection. The lowest concentration of SARS-CoV-2 viral copies this assay can detect is 138 copies/mL. A negative result does not preclude SARS-Cov-2 infection and should not be used as the sole basis for treatment or other patient management decisions. A negative result may occur with  improper specimen collection/handling, submission of specimen other than nasopharyngeal swab, presence of viral mutation(s) within the areas targeted by this assay, and inadequate number of viral copies(<138 copies/mL). A negative result must be combined with clinical observations, patient history, and epidemiological information. The expected result is Negative.  Fact Sheet for Patients:  EntrepreneurPulse.com.au  Fact Sheet for Healthcare Providers:  IncredibleEmployment.be  This test is no t yet approved or cleared by the [place] FDA and  has been authorized for detection and/or diagnosis of SARS-CoV-2 by FDA under an Emergency Use Authorization (EUA). This EUA will remain  in  effect (meaning this test can be used) for the duration of the COVID-19 declaration under Section 564(b)(1) of the Act, 21 U.S.C.section 360bbb-3(b)(1), unless the authorization is terminated  or revoked sooner.       Influenza A by PCR NEGATIVE NEGATIVE Final   Influenza B by PCR NEGATIVE NEGATIVE Final    Comment: (NOTE) The Xpert Xpress SARS-CoV-2/FLU/RSV plus assay is intended as an aid in the diagnosis of influenza from Nasopharyngeal swab specimens and should not be used as a sole basis for treatment. Nasal washings and aspirates are unacceptable for Xpert Xpress SARS-CoV-2/FLU/RSV testing.  Fact Sheet for Patients: EntrepreneurPulse.com.au  Fact Sheet for Healthcare Providers: IncredibleEmployment.be  This test is not yet approved or cleared by the [place] FDA and has been authorized for detection and/or diagnosis of SARS-CoV-2 by FDA under an Emergency Use Authorization (EUA). This EUA will remain in effect (meaning this test can be used) for the duration of the COVID-19 declaration under Section 564(b)(1) of the Act, 21 U.S.C. section 360bbb-3(b)(1), unless the authorization is terminated or revoked.  Performed at [facility], [place] [address] [place], [place] [address]   Urine Culture     Status: Abnormal   Collection Time: 07/26/20  5:05 PM   Specimen: Urine, Random  Result Value Ref Range Status   Specimen Description   Final    URINE, RANDOM Performed at [place] [address], [place], [place] [address]    Special Requests   Final    NONE Performed at [facility], [place] [address] [place], [place] [address]    Culture MULTIPLE SPECIES PRESENT, SUGGEST RECOLLECTION (A)  Final   Report Status 07/28/2020 FINAL  Final  Blood culture (routine x 2)     Status: None (Preliminary result)   Collection Time: 07/26/20  5:12 PM   Specimen: BLOOD  Result Value Ref  Range Status  Specimen Description   Final    BLOOD LEFT ANTECUBITAL Performed at [place] [address] [place], [place] [address]    Special Requests   Final    BOTTLES DRAWN AEROBIC AND ANAEROBIC Blood Culture adequate volume Performed at [place] [address] [place], [place] [address]    Culture   Final    NO GROWTH 3 DAYS Performed at [facility], [place] [address] [place], [place] [address]    Report Status PENDING  Incomplete  Blood culture (routine x 2)     Status: None (Preliminary result)   Collection Time: 07/26/20  5:17 PM   Specimen: BLOOD LEFT FOREARM  Result Value Ref Range Status   Specimen Description   Final    BLOOD LEFT FOREARM Performed at [place] [address], [place], [place] [address]    Special Requests   Final    BOTTLES DRAWN AEROBIC AND ANAEROBIC Blood Culture results may not be optimal due to an inadequate volume of blood received in culture bottles   Culture   Final    NO GROWTH 3 DAYS Performed at [facility], [place] [address] [place], [place] [address]    Report Status PENDING  Incomplete  Respiratory Panel by PCR     Status: None   Collection Time: 07/26/20  6:01 PM   Specimen: Nasopharyngeal Swab; Respiratory  Result Value Ref Range Status   Adenovirus NOT DETECTED NOT DETECTED Final   Coronavirus 229E NOT DETECTED NOT DETECTED Final    Comment: (NOTE) The Coronavirus on the Respiratory Panel, DOES NOT test for the novel  Coronavirus (2019 nCoV)    Coronavirus HKU1 NOT DETECTED NOT DETECTED Final   Coronavirus NL63 NOT DETECTED NOT DETECTED Final   Coronavirus OC43 NOT DETECTED NOT DETECTED Final   Metapneumovirus NOT DETECTED NOT DETECTED Final   Rhinovirus / Enterovirus NOT DETECTED NOT DETECTED Final   Influenza A NOT DETECTED NOT DETECTED Final   Influenza B NOT DETECTED NOT DETECTED Final   Parainfluenza Virus 1 NOT DETECTED NOT DETECTED  Final   Parainfluenza Virus 2 NOT DETECTED NOT DETECTED Final   Parainfluenza Virus 3 NOT DETECTED NOT DETECTED Final   Parainfluenza Virus 4 NOT DETECTED NOT DETECTED Final   Respiratory Syncytial Virus NOT DETECTED NOT DETECTED Final   Bordetella pertussis NOT DETECTED NOT DETECTED Final   Chlamydophila pneumoniae NOT DETECTED NOT DETECTED Final   Mycoplasma pneumoniae NOT DETECTED NOT DETECTED Final    Comment: Performed at [facility], [place]. [address], [place], [place] [address]  MRSA PCR Screening     Status: None   Collection Time: 07/28/20  5:20 PM   Specimen: Nasal Mucosa; Nasopharyngeal  Result Value Ref Range Status   MRSA by PCR NEGATIVE NEGATIVE Final    Comment:        The GeneXpert MRSA Assay (FDA approved for NASAL specimens only), is one component of a comprehensive MRSA colonization surveillance program. It is not intended to diagnose MRSA infection nor to guide or monitor treatment for MRSA infections. Performed at [facility], [place] [address], [place],  [address]       Radiology Studies: DG CHEST PORT 1 VIEW  Result Date: 07/28/2020 CLINICAL DATA:  Shortness of breath. EXAM: PORTABLE CHEST 1 VIEW COMPARISON:  CT 07/27/2020.  Chest x-ray 07/27/2020. FINDINGS: Mediastinum and hilar structures normal. Heart size normal. Persistent mild bibasilar atelectasis and interstitial prominence. No  focal alveolar infiltrates. No pleural effusion or pneumothorax. IMPRESSION: Persistent mild bibasilar atelectasis and interstitial prominence. No focal alveolar infiltrate. Chest is unchanged from prior exams. Electronically Signed   By: Marcello Moores  Register   On: 07/28/2020 06:53     Scheduled Meds: . budesonide (PULMICORT) nebulizer solution  0.25 mg Nebulization BID  . enoxaparin (LOVENOX) injection  40 mg Subcutaneous Q24H  . feeding supplement  237 mL Oral BID BM  . loratadine  10 mg Oral Daily  . metroNIDAZOLE  500 mg Oral Q8H  .  mirtazapine  15 mg Oral QPM  . montelukast  10 mg Oral QPM  . pantoprazole  40 mg Oral Daily  . pravastatin  40 mg Oral q1800   Continuous Infusions: . cefTRIAXone (ROCEPHIN)  IV 2 g (07/29/20 1742)     LOS: 2 days    Time spent: 25 minutes spent in the coordination of care today.    Jonnie Finner, DO [facility]  If 7PM-7AM, please contact night-coverage [email or web address] 07/29/2020, 6:44 PM

## 2020-07-29 NOTE — Progress Notes (Signed)
Patient with no urinary output documented. Bladder scanned reading showed  202 ml. Dr. Marylyn Ishihara notified new orders received.

## 2020-07-29 NOTE — Progress Notes (Addendum)
Patient assisted to bathroom by Enis Slipper (daughter).  Patient voided 250 ml of dark yellow urine. Will hold off on in and cath for now

## 2020-07-30 DIAGNOSIS — J9601 Acute respiratory failure with hypoxia: Secondary | ICD-10-CM | POA: Diagnosis not present

## 2020-07-30 LAB — CBC WITH DIFFERENTIAL/PLATELET
Abs Immature Granulocytes: 0.03 10*3/uL (ref 0.00–0.07)
Basophils Absolute: 0.1 10*3/uL (ref 0.0–0.1)
Basophils Relative: 1 %
Eosinophils Absolute: 0.8 10*3/uL — ABNORMAL HIGH (ref 0.0–0.5)
Eosinophils Relative: 10 %
HCT: 35.9 % — ABNORMAL LOW (ref 36.0–46.0)
Hemoglobin: 12 g/dL (ref 12.0–15.0)
Immature Granulocytes: 0 %
Lymphocytes Relative: 25 %
Lymphs Abs: 2.1 10*3/uL (ref 0.7–4.0)
MCH: 30.7 pg (ref 26.0–34.0)
MCHC: 33.4 g/dL (ref 30.0–36.0)
MCV: 91.8 fL (ref 80.0–100.0)
Monocytes Absolute: 0.8 10*3/uL (ref 0.1–1.0)
Monocytes Relative: 9 %
Neutro Abs: 4.6 10*3/uL (ref 1.7–7.7)
Neutrophils Relative %: 55 %
Platelets: 346 10*3/uL (ref 150–400)
RBC: 3.91 MIL/uL (ref 3.87–5.11)
RDW: 13.2 % (ref 11.5–15.5)
WBC: 8.3 10*3/uL (ref 4.0–10.5)
nRBC: 0 % (ref 0.0–0.2)

## 2020-07-30 LAB — RENAL FUNCTION PANEL
Albumin: 3 g/dL — ABNORMAL LOW (ref 3.5–5.0)
Anion gap: 9 (ref 5–15)
BUN: 7 mg/dL — ABNORMAL LOW (ref 8–23)
CO2: 24 mmol/L (ref 22–32)
Calcium: 8.6 mg/dL — ABNORMAL LOW (ref 8.9–10.3)
Chloride: 107 mmol/L (ref 98–111)
Creatinine, Ser: 0.64 mg/dL (ref 0.44–1.00)
GFR, Estimated: >60 mL/min (ref >60)
Glucose, Bld: 101 mg/dL — ABNORMAL HIGH (ref 70–99)
Phosphorus: 2.5 mg/dL (ref 2.5–4.6)
Potassium: 3.7 mmol/L (ref 3.5–5.1)
Sodium: 140 mmol/L (ref 135–145)

## 2020-07-30 LAB — MAGNESIUM: Magnesium: 1.9 mg/dL (ref 1.7–2.4)

## 2020-07-30 NOTE — Progress Notes (Signed)
PROGRESS NOTE    Melinda Griffin  VOZ:[id number] DOB: April 30, 1941 DOA: 07/26/2020 PCP: [place] Carol, MD   Brief Narrative:   Melinda Griffin a 79 y.o.femalewith medical history significant ofanxiety, history of asthma, history of hyperlipidemia, GERD, history of migraines as well as other comorbidities as well as advanced dementia who sees a neurologist at [facility] who presents with a chief complaint of worsening hypoxia, confusion, agitation.   12/5: UOP better. She is more interactive today. Ok to discharge, but facility is not accepting patients today. Look for d/c tomorrow.    Assessment & Plan: Acute Respiratory Failure with Hypoxia in the setting of likely Viral CAP vs Viral Pneumonitis, ruled out PE     - CXR showed "The heart size and mediastinal contours are within normal limits. Increased prominence of interstitial lung markings is seen throughout both lungs, suspicious for viral or other atypical pneumonitis; interstitial edema is considered less likely. No evidence pulmonary consolidation or pleural effusion."     - After discussion with ID, abx regimen is rocephin/flagyl; set abx end date for 08/02/20     - RVP negative     - pulmicort, singular     - D-dimer is 1.03     - CTA of the chest showed no evidence of PE and there is no thoracic aortic aneurysm or dissection.  There is an area of somewhat ill-defined airspace opacity in the lower lung regions and inferior lingula likely representing multifocal pneumonia     - Patient sees Dr. Lamonte Sakai as an outpatientand has an appointment set up for Monday as an outpatient     - TOC Consulted for assistance with Home Health but family wants to go to SNF first     - Palliative Care to follow at SNF      - 12/5: abx through 12/8; should be good to go to SNF when they accept for bed tomorrow  Elevated D-Dimer     - see above  Agitation and Worsened Encephalopathy in the setting of Hypoxia superimposed on Chronic  Dementia     - improved     - likely this is the progression of Dementia     - pt to follow-up with palliative care in outpatient setting at SNF     - Check TSH, RPR nonreactive, B12was 3633     - Delirium Precautions     - c/w Mirtazapine 15 mg po qHS and Citalopram 40 mg po Daily     - WILL NOT TRY ATIVAN or HALDOL; If patient becomes agitated will try Olanzapine 2.5 mg po DailyPRN and Soft Mitten Restraints     - 12/5: she is doing well w/ above PRNs  Recurrent Syncope     - Unclear Etiology but has been happening since September      - c/w Telemetry Monitoring; No events noted on telemetry     - pressures ok     - Echo showed grade 1 diastolic dysfunction but normal EF     - 12/5: No new episodes, follow  GERD     - PPI  HLD     - statin  Hyperglycemia     - resolved  Hypophosphatemia     - resolved  Metabolic Acidosis     - resolved, follow  Decreased UOP     - fluid challenge, I&O cath, follow     - 12/5: resolved  DVT prophylaxis: lovenox Code Status: DNR Family Communication: None at bedside   Status  is: Inpatient  Remains inpatient appropriate because:Unsafe d/c plan   Dispo: The patient is from: Home              Anticipated d/c is to: SNF              Anticipated d/c date is: 1 day              Patient currently is medically stable to d/c.  Antimicrobials:  . Rocephin, flagyl   ROS:  Denies CP, N, V, dyspnea . Remainder ROS is negative for all not previously mentioned.  Subjective: "I get to go there first?"  Objective: Vitals:   07/30/20 0628 07/30/20 0850 07/30/20 0853 07/30/20 1349  BP: (!) 123/52   138/75  Pulse: 80   92  Resp: 18   16  Temp: 98.3 F (36.8 C)   98 F (36.7 C)  TempSrc: Oral   Oral  SpO2: 94% 94% 94% 94%  Weight: 54.2 kg     Height:        Intake/Output Summary (Last 24 hours) at 07/30/2020 1507 Last data filed at 07/30/2020 1349 Gross per 24 hour  Intake 180 ml  Output 1800 ml  Net -1620 ml    Filed Weights   07/26/20 2105 07/28/20 0500 07/30/20 0628  Weight: 55 kg 57.4 kg 54.2 kg    Examination:  General: 79 y.o. female resting in bed in NAD Eyes: PERRL, normal sclera ENMT: Nares patent w/o discharge, orophaynx clear, dentition normal, ears w/o discharge/lesions/ulcers Neck: Supple, trachea midline Cardiovascular: RRR, +S1, S2, no m/g/r, equal pulses throughout Respiratory: CTABL, no w/r/r, normal WOB GI: BS+, NDNT, no masses noted, no organomegaly noted MSK: No e/c/c Skin: No rashes, bruises, ulcerations noted Neuro: A&O x 2, no focal deficits Psyc: Appropriate interaction and affect, calm/cooperative   Data Reviewed: I have personally reviewed following labs and imaging studies.  CBC: Recent Labs  Lab 07/26/20 1540 07/26/20 1540 07/26/20 2200 07/27/20 0614 07/28/20 0607 07/29/20 0723 07/30/20 0634  WBC 9.9   < > 10.9* 11.5* 15.5* 10.6* 8.3  NEUTROABS 5.5  --   --   --  11.4* 6.5 4.6  HGB 13.5   < > 12.3 12.1 12.4 11.9* 12.0  HCT 39.0   < > 37.2 35.9* 37.2 35.5* 35.9*  MCV 90.3   < > 92.8 92.5 92.3 91.0 91.8  PLT 334   < > 312 295 305 304 346   < > = values in this interval not displayed.   Basic Metabolic Panel: Recent Labs  Lab 07/26/20 1540 07/27/20 0614 07/28/20 0607 07/29/20 0723 07/30/20 0634  NA 140 139 137 138 140  K 3.8 3.5 4.0 3.6 3.7  CL 103 106 106 103 107  CO2 24 21* 21* 23 24  GLUCOSE 112* 98 106* 114* 101*  BUN 19 14 <5* 7* 7*  CREATININE 0.74 0.66 0.67 0.57 0.64  CALCIUM 9.2 8.4* 8.2* 8.3* 8.6*  MG  --  1.7 1.9 2.0 1.9  PHOS  --  2.6 1.8* 2.4* 2.5   GFR: Estimated Creatinine Clearance: 41.6 mL/min (by C-G formula based on SCr of 0.64 mg/dL). Liver Function Tests: Recent Labs  Lab 07/26/20 1540 07/27/20 0614 07/28/20 0607 07/29/20 0723 07/30/20 0634  AST 18 20 19 18   --   ALT 16 16 16 15   --   ALKPHOS 69 55 60 64  --   BILITOT 0.7 0.5 0.5 0.3  --   PROT 8.1 6.7 7.0 7.0  --  ALBUMIN 3.5 3.0* 3.0* 2.9* 3.0*    No results for input(s): LIPASE, AMYLASE in the last 168 hours. No results for input(s): AMMONIA in the last 168 hours. Coagulation Profile: No results for input(s): INR, PROTIME in the last 168 hours. Cardiac Enzymes: No results for input(s): CKTOTAL, CKMB, CKMBINDEX, TROPONINI in the last 168 hours. BNP (last 3 results) No results for input(s): PROBNP in the last 8760 hours. HbA1C: No results for input(s): HGBA1C in the last 72 hours. CBG: Recent Labs  Lab 07/27/20 0533 07/28/20 0749 07/29/20 0745  GLUCAP 102* 107* 117*   Lipid Profile: No results for input(s): CHOL, HDL, LDLCALC, TRIG, CHOLHDL, LDLDIRECT in the last 72 hours. Thyroid Function Tests: No results for input(s): TSH, T4TOTAL, FREET4, T3FREE, THYROIDAB in the last 72 hours. Anemia Panel: No results for input(s): VITAMINB12, FOLATE, FERRITIN, TIBC, IRON, RETICCTPCT in the last 72 hours. Sepsis Labs: Recent Labs  Lab 07/26/20 1540 07/26/20 2200 07/27/20 0614 07/28/20 0607  PROCALCITON  --  <0.10 <0.10 <0.10  LATICACIDVEN 0.8  --   --   --     Recent Results (from the past 240 hour(s))  Resp Panel by RT-PCR (Flu A&B, Covid) Nasopharyngeal Swab     Status: None   Collection Time: 07/26/20  3:48 PM   Specimen: Nasopharyngeal Swab; Nasopharyngeal(NP) swabs in vial transport medium  Result Value Ref Range Status   SARS Coronavirus 2 by RT PCR NEGATIVE NEGATIVE Final    Comment: (NOTE) SARS-CoV-2 target nucleic acids are NOT DETECTED.  The SARS-CoV-2 RNA is generally detectable in upper respiratory specimens during the acute phase of infection. The lowest concentration of SARS-CoV-2 viral copies this assay can detect is 138 copies/mL. A negative result does not preclude SARS-Cov-2 infection and should not be used as the sole basis for treatment or other patient management decisions. A negative result may occur with  improper specimen collection/handling, submission of specimen other than nasopharyngeal  swab, presence of viral mutation(s) within the areas targeted by this assay, and inadequate number of viral copies(<138 copies/mL). A negative result must be combined with clinical observations, patient history, and epidemiological information. The expected result is Negative.  Fact Sheet for Patients:  EntrepreneurPulse.com.au  Fact Sheet for Healthcare Providers:  IncredibleEmployment.be  This test is no t yet approved or cleared by the [place] FDA and  has been authorized for detection and/or diagnosis of SARS-CoV-2 by FDA under an Emergency Use Authorization (EUA). This EUA will remain  in effect (meaning this test can be used) for the duration of the COVID-19 declaration under Section 564(b)(1) of the Act, 21 U.S.C.section 360bbb-3(b)(1), unless the authorization is terminated  or revoked sooner.       Influenza A by PCR NEGATIVE NEGATIVE Final   Influenza B by PCR NEGATIVE NEGATIVE Final    Comment: (NOTE) The Xpert Xpress SARS-CoV-2/FLU/RSV plus assay is intended as an aid in the diagnosis of influenza from Nasopharyngeal swab specimens and should not be used as a sole basis for treatment. Nasal washings and aspirates are unacceptable for Xpert Xpress SARS-CoV-2/FLU/RSV testing.  Fact Sheet for Patients: EntrepreneurPulse.com.au  Fact Sheet for Healthcare Providers: IncredibleEmployment.be  This test is not yet approved or cleared by the [place] FDA and has been authorized for detection and/or diagnosis of SARS-CoV-2 by FDA under an Emergency Use Authorization (EUA). This EUA will remain in effect (meaning this test can be used) for the duration of the COVID-19 declaration under Section 564(b)(1) of the Act, 21 U.S.C. section 360bbb-3(b)(1),  unless the authorization is terminated or revoked.  Performed at [facility], [place] [address] [place], [place] [address]    Urine Culture     Status: Abnormal   Collection Time: 07/26/20  5:05 PM   Specimen: Urine, Random  Result Value Ref Range Status   Specimen Description   Final    URINE, RANDOM Performed at [place] [address], [place], [place] [address]    Special Requests   Final    NONE Performed at [facility], [place] [address] [place], [place] [address]    Culture MULTIPLE SPECIES PRESENT, SUGGEST RECOLLECTION (A)  Final   Report Status 07/28/2020 FINAL  Final  Blood culture (routine x 2)     Status: None (Preliminary result)   Collection Time: 07/26/20  5:12 PM   Specimen: BLOOD  Result Value Ref Range Status   Specimen Description   Final    BLOOD LEFT ANTECUBITAL Performed at [place] [address], [place], [place] [address]    Special Requests   Final    BOTTLES DRAWN AEROBIC AND ANAEROBIC Blood Culture adequate volume Performed at [place] [address] [place], [place] [address]    Culture   Final    NO GROWTH 4 DAYS Performed at [facility], [place] [address], [place], [place] [address]    Report Status PENDING  Incomplete  Blood culture (routine x 2)     Status: None (Preliminary result)   Collection Time: 07/26/20  5:17 PM   Specimen: BLOOD LEFT FOREARM  Result Value Ref Range Status   Specimen Description   Final    BLOOD LEFT FOREARM Performed at [place] [address], [place], [place] [address]    Special Requests   Final    BOTTLES DRAWN AEROBIC AND ANAEROBIC Blood Culture results may not be optimal due to an inadequate volume of blood received in culture bottles   Culture   Final    NO GROWTH 4 DAYS Performed at [facility], [place] [address] [place],  [address]    Report Status PENDING  Incomplete  Respiratory Panel by PCR     Status: None   Collection Time: 07/26/20  6:01 PM   Specimen: Nasopharyngeal Swab; Respiratory   Result Value Ref Range Status   Adenovirus NOT DETECTED NOT DETECTED Final   Coronavirus 229E NOT DETECTED NOT DETECTED Final    Comment: (NOTE) The Coronavirus on the Respiratory Panel, DOES NOT test for the novel  Coronavirus (2019 nCoV)    Coronavirus HKU1 NOT DETECTED NOT DETECTED Final   Coronavirus NL63 NOT DETECTED NOT DETECTED Final   Coronavirus OC43 NOT DETECTED NOT DETECTED Final   Metapneumovirus NOT DETECTED NOT DETECTED Final   Rhinovirus / Enterovirus NOT DETECTED NOT DETECTED Final   Influenza A NOT DETECTED NOT DETECTED Final   Influenza B NOT DETECTED NOT DETECTED Final   Parainfluenza Virus 1 NOT DETECTED NOT DETECTED Final   Parainfluenza Virus 2 NOT DETECTED NOT DETECTED Final   Parainfluenza Virus 3 NOT DETECTED NOT DETECTED Final   Parainfluenza Virus 4 NOT DETECTED NOT DETECTED Final   Respiratory Syncytial Virus NOT DETECTED NOT DETECTED Final   Bordetella pertussis NOT DETECTED NOT DETECTED Final   Chlamydophila pneumoniae NOT DETECTED NOT DETECTED Final   Mycoplasma pneumoniae NOT DETECTED NOT DETECTED Final    Comment: Performed at [facility], [place]. [address]  [place], [place] [address]  MRSA PCR Screening     Status: None   Collection Time: 07/28/20  5:20 PM   Specimen: Nasal Mucosa; Nasopharyngeal  Result Value Ref Range Status   MRSA by PCR NEGATIVE NEGATIVE Final    Comment:        The GeneXpert MRSA Assay (FDA approved for NASAL specimens only), is one component of a comprehensive MRSA colonization surveillance program. It is not intended to diagnose MRSA infection nor to guide or monitor treatment for MRSA infections. Performed at [facility], [place] [address], [place], [place] [address]       Radiology Studies: No results found.   Scheduled Meds: . budesonide (PULMICORT) nebulizer solution  0.25 mg Nebulization BID  . enoxaparin (LOVENOX) injection  40 mg Subcutaneous Q24H  . feeding supplement  237 mL  Oral BID BM  . loratadine  10 mg Oral Daily  . metroNIDAZOLE  500 mg Oral Q8H  . mirtazapine  15 mg Oral QPM  . montelukast  10 mg Oral QPM  . pantoprazole  40 mg Oral Daily  . pravastatin  40 mg Oral q1800   Continuous Infusions: . cefTRIAXone (ROCEPHIN)  IV 2 g (07/29/20 1742)     LOS: 3 days    Time spent: 25 minutes spent in the coordination of care today.    Jonnie Finner, DO [facility]  If 7PM-7AM, please contact night-coverage [email or web address] 07/30/2020, 3:07 PM

## 2020-07-30 NOTE — TOC Progression Note (Signed)
Transition of Care [facility]) - Progression Note    Patient Details  Name: Pattye Meda MRN: [id number] Date of Birth: 08/13/1941  Transition of [facility]) CM/SW [place], [place] Phone Number: 07/30/2020, 9:58 AM  Clinical Narrative:   Patient is medically ready for discharge to [facility]. LCSW completed call to [facility] front desk and was informed that they will have to contact their admissions director who is out of the office today to inquire about this request. LCSW received return call back from [facility] front desk and was informed that they could have accepted patient yesterday while admissions were there but not today. Admissions director states that patient can come tomorrow. Attending physician and [facility] RNCM were updated.   Expected Discharge Plan: [place] tomorrow on 07/31/20. Barriers to Discharge: No Barriers Identified  Expected Discharge Plan and Services Expected Discharge Plan: [place]   Discharge Planning Services: CM Consult   Living arrangements for the past 2 months: [place], [place], [place]W, [email or web address] Phone: [phone]

## 2020-07-31 ENCOUNTER — Inpatient Hospital Stay: Payer: Medicare Other | Admitting: Pulmonary Disease

## 2020-07-31 DIAGNOSIS — J9601 Acute respiratory failure with hypoxia: Secondary | ICD-10-CM | POA: Diagnosis not present

## 2020-07-31 LAB — CULTURE, BLOOD (ROUTINE X 2)
Culture: NO GROWTH
Culture: NO GROWTH
Special Requests: ADEQUATE

## 2020-07-31 LAB — GLUCOSE, CAPILLARY
Glucose-Capillary: 98 mg/dL (ref 70–99)
Glucose-Capillary: 108 mg/dL — ABNORMAL HIGH (ref 70–99)

## 2020-07-31 NOTE — TOC Transition Note (Signed)
Transition of Care [facility]) - CM/SW Discharge Note   Patient Details  Name: Melinda Griffin MRN: [id number] Date of Birth: 05/16/1941  Transition of Care [facility]) CM/SW Contact:  Lynnell Catalan, RN Phone Number: 07/31/2020, 12:38 PM   Clinical Narrative:    Pt to dc to [facility] today. Daughter Melinda Griffin called to inform of dc. DC summary sent in the hub. RN to call report to [place]. [facility] contacted for transport. Yellow DNR on chart for transport.     Barriers to Discharge: No Barriers Identified   Patient Goals and CMS Choice Patient states their goals for this hospitalization and ongoing recovery are:: Pt is plesantly confused     Discharge Plan and Services   Discharge Planning Services: CM Consult                   Readmission Risk Interventions No flowsheet data found.

## 2020-07-31 NOTE — Discharge Summary (Signed)
Physician Discharge Summary   Melinda Griffin [facility] MCN:[id number] DOB: Sep 14, 1940 DOA: 07/26/2020  PCP: Melinda Carol, MD  Admit date: 07/26/2020 Discharge date: 07/31/2020  Admitted From: SNF Disposition:  SNF Discharging physician: Melinda Dee, MD  Recommendations for Outpatient Follow-up:  1. If becomes agitated try olanzapine (did not do well with ativan or haldol in hospital) 2. Need pulmonology appt rescheduled (was to be 12/6)   Patient discharged to SNF in Discharge Condition: stable CODE STATUS: DNR Diet recommendation:  Diet Orders (From admission, onward)    Start     Ordered   07/26/20 2107  DIET SOFT Room service appropriate? Yes; Fluid consistency: Thin  Diet effective now       Question Answer Comment  Room service appropriate? Yes   Fluid consistency: Thin      07/26/20 2106          Hospital Course: Melinda Griffin Weathersbeeis a 79 y.o.femalewith medical history significant ofanxiety, history of asthma, history of hyperlipidemia, GERD, history of migraines as well as other comorbidities as well as advanced dementia who sees a neurologist at [facility] who presented with a chief complaint of worsening hypoxia, confusion, agitation. She underwent workup for infectious etiologies and was considered to have acute hypoxia from likely underlying viral CAP/pneumonitis. She was treated empirically with antibiotics as well during hospitalization and completed her course. CTA chest was also negative for PE.  She was able to be weaned down to RA and was stable prior to discharge.   Acute Respiratory Failure with Hypoxia in the setting of likely Viral CAP vs Viral Pneumonitis, ruled out PE - CXR showed "The heart size and mediastinal contours are within normal limits. Increased prominence of interstitial lung markings is seen throughout both lungs, suspicious for viral or other atypical pneumonitis; interstitial edema is considered less likely. No evidence  pulmonary consolidation or pleural effusion." - abx course completed inpatient - RVP negative - pulmicort, singular - CTA of the chest showed no evidence of PE and there is no thoracic aortic aneurysm or dissection. There is an area of somewhat ill-defined airspace opacity in the lower lung regions and inferior lingula likely representing multifocal pneumonia - Patient sees Dr. Lamonte Griffin as an outpatientand has an appointment set up for Monday as an outpatient (needs to be rescheduled as patient still in hospital) - Palliative Care to follow at [facility]   Elevated D-Dimer - see above  Agitation and Worsened Encephalopathy in the setting of Hypoxia superimposed on Chronic Dementia - improved - likely this is the progression of Dementia - pt to follow-up with palliative care in outpatient setting at SNF - Delirium Precautions - c/w Mirtazapine 15 mg po qHS and Citalopram 40 mg po Daily - WILL NOT TRY ATIVAN or HALDOL; If patient becomes agitated will try Olanzapine 2.5 mg po DailyPRN     - 12/5 and 12/6: she is doing well w/ above PRNs  Recurrent Syncope - Unclear Etiology but has been happening since September  - c/w Telemetry Monitoring; No events noted on telemetry - pressures ok - Echo showed grade 1 diastolic dysfunction but normal EF     - 12/5: No new episodes, follow  GERD - PPI  HLD - statin  Hyperglycemia - resolved  Hypophosphatemia - resolved  Metabolic Acidosis - resolved, follow  Decreased UOP - fluid challenge, I&O cath, follow     - 12/5: resolved  Principal Diagnosis: Acute respiratory failure [facility])  Discharge Diagnoses: Active Hospital Problems   Diagnosis Date Noted  .  Acute respiratory failure ([place]) 07/25/2020  . Malnutrition of moderate degree 07/28/2020    Resolved Hospital Problems  No resolved problems to display.    Discharge Instructions     Increase activity slowly   Complete by: As directed      Allergies as of 07/31/2020      Reactions   Shrimp [shellfish Allergy] Anaphylaxis   Aspirin    Advised by MD previously not to take   Ceftin    Dizzy & Diarrhea   Ciprofloxacin Hcl    Dizzy, diarrhea      Medication List    STOP taking these medications   doxycycline 100 MG tablet Commonly known as: VIBRA-TABS     TAKE these medications   albuterol 108 (90 Base) MCG/ACT inhaler Commonly known as: VENTOLIN HFA Inhale 1-2 puffs into the lungs every 6 (six) hours as needed for wheezing or shortness of breath. What changed: Another medication with the same name was added. Make sure you understand how and when to take each.   albuterol (2.5 MG/3ML) 0.083% nebulizer solution Commonly known as: PROVENTIL Take 3 mLs (2.5 mg total) by nebulization every 6 (six) hours as needed for wheezing or shortness of breath. What changed: You were already taking a medication with the same name, and this prescription was added. Make sure you understand how and when to take each.   budesonide 0.25 MG/2ML nebulizer solution Commonly known as: Pulmicort Take 2 mLs (0.25 mg total) by nebulization 2 (two) times daily. Partial refill ok   Calcium 500 + D 500-200 MG-UNIT Tabs Generic drug: Calcium Carb-Cholecalciferol Take 1 tablet by mouth daily.   cetirizine 10 MG tablet Commonly known as: ZYRTEC Take 10 mg by mouth daily. Take one tablet once daily for allergies   citalopram 40 MG tablet Commonly known as: CELEXA TAKE 1 TABLET BY MOUTH ONCE DAILY   lovastatin 40 MG tablet Commonly known as: MEVACOR TAKE 1 TABLET BY MOUTH EVERY DAY TO LOWER CHOLESTEROL What changed: See the new instructions.   Melatonin 10 MG Tabs Take 10 mg by mouth at bedtime as needed (sleep).   mirtazapine 15 MG tablet Commonly known as: REMERON Take 15 mg by mouth every evening.   montelukast 10 MG tablet Commonly known as: SINGULAIR TAKE 1 TABLET BY  MOUTH EVERY DAY What changed: when to take this   omeprazole 10 MG capsule Commonly known as: PRILOSEC Take 10 mg by mouth in the morning and at bedtime.       Contact information for after-discharge care    Destination    [facility] .   Service: Skilled Nursing Contact information: [place] [address] [phone]                 Allergies  Allergen Reactions  . 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 Aspirin     Advised by MD previously not to take  . Ceftin     Dizzy & Diarrhea  . Ciprofloxacin Hcl     Dizzy, diarrhea    Consultations:   Discharge Exam: BP (!) 132/42 (BP Location: Right Arm)   Pulse 66   Temp 97.8 F (36.6 C) (Oral)   Resp 14   Ht 5\' 1"  (1.549 m)   Wt 53.7 kg   SpO2 97%   BMI 22.37 kg/m  General appearance: pleasantly demented elderly woman resting in bed in NAD Head: Normocephalic, without obvious abnormality, atraumatic Eyes: EOMI Lungs: clear to auscultation bilaterally Heart:  regular rate and rhythm and S1, S2 normal Abdomen: normal findings: bowel sounds normal and soft, non-tender Extremities: no edema Skin: mobility and turgor normal Neurologic: moves all 4 extremities; underlying dementia noted  The results of significant diagnostics from this hospitalization (including imaging, microbiology, ancillary and laboratory) are listed below for reference.   Microbiology: Recent Results (from the past 240 hour(s))  Resp Panel by RT-PCR (Flu A&B, Covid) Nasopharyngeal Swab     Status: None   Collection Time: 07/26/20  3:48 PM   Specimen: Nasopharyngeal Swab; Nasopharyngeal(NP) swabs in vial transport medium  Result Value Ref Range Status   SARS Coronavirus 2 by RT PCR NEGATIVE NEGATIVE Final    Comment: (NOTE) SARS-CoV-2 target nucleic acids are NOT DETECTED.  The SARS-CoV-2 RNA is generally detectable in upper respiratory specimens during the acute phase of infection.  The lowest concentration of SARS-CoV-2 viral copies this assay can detect is 138 copies/mL. A negative result does not preclude SARS-Cov-2 infection and should not be used as the sole basis for treatment or other patient management decisions. A negative result may occur with  improper specimen collection/handling, submission of specimen other than nasopharyngeal swab, presence of viral mutation(s) within the areas targeted by this assay, and inadequate number of viral copies(<138 copies/mL). A negative result must be combined with clinical observations, patient history, and epidemiological information. The expected result is Negative.  Fact Sheet for Patients:  EntrepreneurPulse.com.au  Fact Sheet for Healthcare Providers:  IncredibleEmployment.be  This test is no t yet approved or cleared by the [place] FDA and  has been authorized for detection and/or diagnosis of SARS-CoV-2 by FDA under an Emergency Use Authorization (EUA). This EUA will remain  in effect (meaning this test can be used) for the duration of the COVID-19 declaration under Section 564(b)(1) of the Act, 21 U.S.C.section 360bbb-3(b)(1), unless the authorization is terminated  or revoked sooner.       Influenza A by PCR NEGATIVE NEGATIVE Final   Influenza B by PCR NEGATIVE NEGATIVE Final    Comment: (NOTE) The Xpert Xpress SARS-CoV-2/FLU/RSV plus assay is intended as an aid in the diagnosis of influenza from Nasopharyngeal swab specimens and should not be used as a sole basis for treatment. Nasal washings and aspirates are unacceptable for Xpert Xpress SARS-CoV-2/FLU/RSV testing.  Fact Sheet for Patients: EntrepreneurPulse.com.au  Fact Sheet for Healthcare Providers: IncredibleEmployment.be  This test is not yet approved or cleared by the [place] FDA and has been authorized for detection and/or diagnosis of SARS-CoV-2 by FDA  under an Emergency Use Authorization (EUA). This EUA will remain in effect (meaning this test can be used) for the duration of the COVID-19 declaration under Section 564(b)(1) of the Act, 21 U.S.C. section 360bbb-3(b)(1), unless the authorization is terminated or revoked.  Performed at [facility], [place] [address] [place], [place] [address]   Urine Culture     Status: Abnormal   Collection Time: 07/26/20  5:05 PM   Specimen: Urine, Random  Result Value Ref Range Status   Specimen Description   Final    URINE, RANDOM Performed at [place] [address] [place], [place] [address]    Special Requests   Final    NONE Performed at [facility], [place] [address], [place], [place] [address]    Culture MULTIPLE SPECIES PRESENT, SUGGEST RECOLLECTION (A)  Final   Report Status 07/28/2020 FINAL  Final  Blood culture (routine x 2)     Status: None  Collection Time: 07/26/20  5:12 PM   Specimen: BLOOD  Result Value Ref Range Status   Specimen Description   Final    BLOOD LEFT ANTECUBITAL Performed at [place] [address] [place], [place] [address]    Special Requests   Final    BOTTLES DRAWN AEROBIC AND ANAEROBIC Blood Culture adequate volume Performed at [place] [address] [place], [place] [address]    Culture   Final    NO GROWTH 5 DAYS Performed at [facility], [place] [address] [place], [place] [address]    Report Status 07/31/2020 FINAL  Final  Blood culture (routine x 2)     Status: None   Collection Time: 07/26/20  5:17 PM   Specimen: BLOOD LEFT FOREARM  Result Value Ref Range Status   Specimen Description   Final    BLOOD LEFT FOREARM Performed at [place] [address], [place], [place] [address]    Special Requests   Final    BOTTLES DRAWN AEROBIC AND ANAEROBIC Blood Culture results may not be optimal due to an inadequate  volume of blood received in culture bottles   Culture   Final    NO GROWTH 5 DAYS Performed at [facility], [place] [address] [place], [place] [address]    Report Status 07/31/2020 FINAL  Final  Respiratory Panel by PCR     Status: None   Collection Time: 07/26/20  6:01 PM   Specimen: Nasopharyngeal Swab; Respiratory  Result Value Ref Range Status   Adenovirus NOT DETECTED NOT DETECTED Final   Coronavirus 229E NOT DETECTED NOT DETECTED Final    Comment: (NOTE) The Coronavirus on the Respiratory Panel, DOES NOT test for the novel  Coronavirus (2019 nCoV)    Coronavirus HKU1 NOT DETECTED NOT DETECTED Final   Coronavirus NL63 NOT DETECTED NOT DETECTED Final   Coronavirus OC43 NOT DETECTED NOT DETECTED Final   Metapneumovirus NOT DETECTED NOT DETECTED Final   Rhinovirus / Enterovirus NOT DETECTED NOT DETECTED Final   Influenza A NOT DETECTED NOT DETECTED Final   Influenza B NOT DETECTED NOT DETECTED Final   Parainfluenza Virus 1 NOT DETECTED NOT DETECTED Final   Parainfluenza Virus 2 NOT DETECTED NOT DETECTED Final   Parainfluenza Virus 3 NOT DETECTED NOT DETECTED Final   Parainfluenza Virus 4 NOT DETECTED NOT DETECTED Final   Respiratory Syncytial Virus NOT DETECTED NOT DETECTED Final   Bordetella pertussis NOT DETECTED NOT DETECTED Final   Chlamydophila pneumoniae NOT DETECTED NOT DETECTED Final   Mycoplasma pneumoniae NOT DETECTED NOT DETECTED Final    Comment: Performed at [facility], [place] [address] [place], [place] [address]  MRSA PCR Screening     Status: None   Collection Time: 07/28/20  5:20 PM   Specimen: Nasal Mucosa; Nasopharyngeal  Result Value Ref Range Status   MRSA by PCR NEGATIVE NEGATIVE Final    Comment:        The GeneXpert MRSA Assay (FDA approved for NASAL specimens only), is one component of a comprehensive MRSA colonization surveillance program. It is not intended to diagnose MRSA infection nor to guide or monitor treatment for MRSA  infections. Performed at [facility], [place] [address] [place],  [address]      Labs: BNP (last 3 results) No results for input(s): BNP in the last 8760 hours. Basic Metabolic Panel: Recent Labs  Lab 07/26/20 1540 07/27/20 0614 07/28/20 0607 07/29/20  5009 07/30/20 0634  NA 140 139 137 138 140  K 3.8 3.5 4.0 3.6 3.7  CL 103 106 106 103 107  CO2 24 21* 21* 23 24  GLUCOSE 112* 98 106* 114* 101*  BUN 19 14 <5* 7* 7*  CREATININE 0.74 0.66 0.67 0.57 0.64  CALCIUM 9.2 8.4* 8.2* 8.3* 8.6*  MG  --  1.7 1.9 2.0 1.9  PHOS  --  2.6 1.8* 2.4* 2.5   Liver Function Tests: Recent Labs  Lab 07/26/20 1540 07/27/20 0614 07/28/20 0607 07/29/20 0723 07/30/20 0634  AST 18 20 19 18   --   ALT 16 16 16 15   --   ALKPHOS 69 55 60 64  --   BILITOT 0.7 0.5 0.5 0.3  --   PROT 8.1 6.7 7.0 7.0  --   ALBUMIN 3.5 3.0* 3.0* 2.9* 3.0*   No results for input(s): LIPASE, AMYLASE in the last 168 hours. No results for input(s): AMMONIA in the last 168 hours. CBC: Recent Labs  Lab 07/26/20 1540 07/26/20 1540 07/26/20 2200 07/27/20 0614 07/28/20 0607 07/29/20 0723 07/30/20 0634  WBC 9.9   < > 10.9* 11.5* 15.5* 10.6* 8.3  NEUTROABS 5.5  --   --   --  11.4* 6.5 4.6  HGB 13.5   < > 12.3 12.1 12.4 11.9* 12.0  HCT 39.0   < > 37.2 35.9* 37.2 35.5* 35.9*  MCV 90.3   < > 92.8 92.5 92.3 91.0 91.8  PLT 334   < > 312 295 305 304 346   < > = values in this interval not displayed.   Cardiac Enzymes: No results for input(s): CKTOTAL, CKMB, CKMBINDEX, TROPONINI in the last 168 hours. BNP: Invalid input(s): POCBNP CBG: Recent Labs  Lab 07/27/20 0533 07/28/20 0749 07/29/20 0745 07/31/20 0612 07/31/20 0821  GLUCAP 102* 107* 117* 98 108*   D-Dimer No results for input(s): DDIMER in the last 72 hours. Hgb A1c No results for input(s): HGBA1C in the last 72 hours. Lipid Profile No results for input(s): CHOL, HDL, LDLCALC, TRIG, CHOLHDL, LDLDIRECT in the last 72  hours. Thyroid function studies No results for input(s): TSH, T4TOTAL, T3FREE, THYROIDAB in the last 72 hours.  Invalid input(s): FREET3 Anemia work up No results for input(s): VITAMINB12, FOLATE, FERRITIN, TIBC, IRON, RETICCTPCT in the last 72 hours. Urinalysis    Component Value Date/Time   COLORURINE YELLOW 07/26/2020 1705   APPEARANCEUR HAZY (A) 07/26/2020 1705   LABSPEC 1.019 07/26/2020 1705   PHURINE 5.0 07/26/2020 1705   GLUCOSEU NEGATIVE 07/26/2020 1705   HGBUR NEGATIVE 07/26/2020 1705   BILIRUBINUR NEGATIVE 07/26/2020 1705   KETONESUR 20 (A) 07/26/2020 1705   PROTEINUR NEGATIVE 07/26/2020 1705   NITRITE NEGATIVE 07/26/2020 1705   LEUKOCYTESUR LARGE (A) 07/26/2020 1705   Sepsis Labs Invalid input(s): PROCALCITONIN,  WBC,  LACTICIDVEN Microbiology Recent Results (from the past 240 hour(s))  Resp Panel by RT-PCR (Flu A&B, Covid) Nasopharyngeal Swab     Status: None   Collection Time: 07/26/20  3:48 PM   Specimen: Nasopharyngeal Swab; Nasopharyngeal(NP) swabs in vial transport medium  Result Value Ref Range Status   SARS Coronavirus 2 by RT PCR NEGATIVE NEGATIVE Final    Comment: (NOTE) SARS-CoV-2 target nucleic acids are NOT DETECTED.  The SARS-CoV-2 RNA is generally detectable in upper respiratory specimens during the acute phase of infection. The lowest concentration of SARS-CoV-2 viral copies this assay can detect is 138 copies/mL. A negative result does not preclude SARS-Cov-2 infection and should  not be used as the sole basis for treatment or other patient management decisions. A negative result may occur with  improper specimen collection/handling, submission of specimen other than nasopharyngeal swab, presence of viral mutation(s) within the areas targeted by this assay, and inadequate number of viral copies(<138 copies/mL). A negative result must be combined with clinical observations, patient history, and epidemiological information. The expected result is  Negative.  Fact Sheet for Patients:  EntrepreneurPulse.com.au  Fact Sheet for Healthcare Providers:  IncredibleEmployment.be  This test is no t yet approved or cleared by the [place] FDA and  has been authorized for detection and/or diagnosis of SARS-CoV-2 by FDA under an Emergency Use Authorization (EUA). This EUA will remain  in effect (meaning this test can be used) for the duration of the COVID-19 declaration under Section 564(b)(1) of the Act, 21 U.S.C.section 360bbb-3(b)(1), unless the authorization is terminated  or revoked sooner.       Influenza A by PCR NEGATIVE NEGATIVE Final   Influenza B by PCR NEGATIVE NEGATIVE Final    Comment: (NOTE) The Xpert Xpress SARS-CoV-2/FLU/RSV plus assay is intended as an aid in the diagnosis of influenza from Nasopharyngeal swab specimens and should not be used as a sole basis for treatment. Nasal washings and aspirates are unacceptable for Xpert Xpress SARS-CoV-2/FLU/RSV testing.  Fact Sheet for Patients: EntrepreneurPulse.com.au  Fact Sheet for Healthcare Providers: IncredibleEmployment.be  This test is not yet approved or cleared by the [place] FDA and has been authorized for detection and/or diagnosis of SARS-CoV-2 by FDA under an Emergency Use Authorization (EUA). This EUA will remain in effect (meaning this test can be used) for the duration of the COVID-19 declaration under Section 564(b)(1) of the Act, 21 U.S.C. section 360bbb-3(b)(1), unless the authorization is terminated or revoked.  Performed at [facility], [place] [address] [place], [place] [address]   Urine Culture     Status: Abnormal   Collection Time: 07/26/20  5:05 PM   Specimen: Urine, Random  Result Value Ref Range Status   Specimen Description   Final    URINE, RANDOM Performed at [place] [address] [place], [place]  [address]    Special Requests   Final    NONE Performed at [facility], [place] [address], [place], [place] [address]    Culture MULTIPLE SPECIES PRESENT, SUGGEST RECOLLECTION (A)  Final   Report Status 07/28/2020 FINAL  Final  Blood culture (routine x 2)     Status: None   Collection Time: 07/26/20  5:12 PM   Specimen: BLOOD  Result Value Ref Range Status   Specimen Description   Final    BLOOD LEFT ANTECUBITAL Performed at [place] [address], [place], [place] [address]    Special Requests   Final    BOTTLES DRAWN AEROBIC AND ANAEROBIC Blood Culture adequate volume Performed at [place] [address] [place], [place] [address]    Culture   Final    NO GROWTH 5 DAYS Performed at [facility], [place] [address], [place], [place] [address]    Report Status 07/31/2020 FINAL  Final  Blood culture (routine x 2)     Status: None   Collection Time: 07/26/20  5:17 PM   Specimen: BLOOD LEFT FOREARM  Result Value Ref Range Status   Specimen Description   Final    BLOOD LEFT FOREARM Performed at [place]  [place], [place] [address]    Special Requests   Final    BOTTLES DRAWN AEROBIC AND ANAEROBIC Blood Culture results may not be optimal due to an inadequate volume of blood received in culture bottles   Culture   Final    NO GROWTH 5 DAYS Performed at [facility], [place] [address] [place], [place] [address]    Report Status 07/31/2020 FINAL  Final  Respiratory Panel by PCR     Status: None   Collection Time: 07/26/20  6:01 PM   Specimen: Nasopharyngeal Swab; Respiratory  Result Value Ref Range Status   Adenovirus NOT DETECTED NOT DETECTED Final   Coronavirus 229E NOT DETECTED NOT DETECTED Final    Comment: (NOTE) The Coronavirus on the Respiratory Panel, DOES NOT test for the novel  Coronavirus (2019 nCoV)    Coronavirus HKU1 NOT DETECTED NOT DETECTED Final    Coronavirus NL63 NOT DETECTED NOT DETECTED Final   Coronavirus OC43 NOT DETECTED NOT DETECTED Final   Metapneumovirus NOT DETECTED NOT DETECTED Final   Rhinovirus / Enterovirus NOT DETECTED NOT DETECTED Final   Influenza A NOT DETECTED NOT DETECTED Final   Influenza B NOT DETECTED NOT DETECTED Final   Parainfluenza Virus 1 NOT DETECTED NOT DETECTED Final   Parainfluenza Virus 2 NOT DETECTED NOT DETECTED Final   Parainfluenza Virus 3 NOT DETECTED NOT DETECTED Final   Parainfluenza Virus 4 NOT DETECTED NOT DETECTED Final   Respiratory Syncytial Virus NOT DETECTED NOT DETECTED Final   Bordetella pertussis NOT DETECTED NOT DETECTED Final   Chlamydophila pneumoniae NOT DETECTED NOT DETECTED Final   Mycoplasma pneumoniae NOT DETECTED NOT DETECTED Final    Comment: Performed at [facility], [place] [address], [place], [place] [address]  MRSA PCR Screening     Status: None   Collection Time: 07/28/20  5:20 PM   Specimen: Nasal Mucosa; Nasopharyngeal  Result Value Ref Range Status   MRSA by PCR NEGATIVE NEGATIVE Final    Comment:        The GeneXpert MRSA Assay (FDA approved for NASAL specimens only), is one component of a comprehensive MRSA colonization surveillance program. It is not intended to diagnose MRSA infection nor to guide or monitor treatment for MRSA infections. Performed at [facility], [place] [address] [place], [place] [address]     Procedures/Studies: DG Chest 2 View  Result Date: 07/25/2020 CLINICAL DATA:  Shortness of breath. Acute respiratory failure with hypoxia. EXAM: CHEST - 2 VIEW COMPARISON:  06/14/2020 FINDINGS: The heart size and mediastinal contours are within normal limits. Increased prominence of interstitial lung markings is seen throughout both lungs, suspicious for viral or other atypical pneumonitis; interstitial edema is considered less likely. No evidence pulmonary consolidation or pleural effusion. IMPRESSION: Increased diffuse  interstitial prominence, suspicious for viral or other atypical pneumonitis, with interstitial edema considered less likely. Electronically Signed   By: Marlaine Hind M.D.   On: 07/25/2020 18:03   CT Head Wo Contrast  Result Date: 07/05/2020 CLINICAL DATA:  Mental status change EXAM: CT HEAD WITHOUT CONTRAST TECHNIQUE: Contiguous axial images were obtained from the base of the skull through the vertex without intravenous contrast. COMPARISON:  CT 11/04/2019 FINDINGS: Brain: No evidence of acute infarction, hemorrhage, hydrocephalus, extra-axial collection, visible mass lesion or mass effect. Symmetric prominence of the ventricles, cisterns and sulci compatible with parenchymal volume loss. Patchy and confluent areas of white matter hypoattenuation are most compatible with chronic microvascular angiopathy. Vascular: Atherosclerotic calcification of the carotid  siphons and intradural vertebral arteries. No hyperdense vessel. Skull: No calvarial fracture or suspicious osseous lesion. No scalp swelling or hematoma. Sinuses/Orbits: Minimal mural thickening throughout the paranasal sinuses no layering air-fluid levels or pneumatized secretions. Mastoid air cells are well aerated. Middle ear cavities are clear. Orbital structures are unremarkable aside from prior lens extractions. Other: None. IMPRESSION: 1. No acute intracranial findings. 2. Chronic microvascular angiopathy and parenchymal volume loss. Electronically Signed   By: Lovena Le M.D.   On: 07/05/2020 00:15   CT ANGIO CHEST PE W OR WO CONTRAST  Result Date: 07/27/2020 CLINICAL DATA:  Hypoxia EXAM: CT ANGIOGRAPHY CHEST WITH CONTRAST TECHNIQUE: Multidetector CT imaging of the chest was performed using the standard protocol during bolus administration of intravenous contrast. Multiplanar CT image reconstructions and MIPs were obtained to evaluate the vascular anatomy. CONTRAST:  41mL OMNIPAQUE IOHEXOL 350 MG/ML SOLN COMPARISON:  Chest radiograph July 27, 2020 FINDINGS: Cardiovascular: There is no demonstrable pulmonary embolus. There is no thoracic aortic aneurysm or dissection. There are scattered foci of calcification in visualized great vessels. There are foci of aortic atherosclerosis as well as foci of coronary artery calcification. There is no pericardial effusion or pericardial thickening. Mediastinum/Nodes: Thyroid appears unremarkable. There is no appreciable thoracic adenopathy. No esophageal lesions are evident. Lungs/Pleura: Small granuloma noted in the right upper lobe. Mild scarring in the extreme apices. There are ill-defined areas of airspace opacity in each lower lobe region, somewhat more notable on the right than on the left. Small area of opacity in the inferior lingula, likely a small focus of pneumonia. There is bibasilar atelectasis. No appreciable pleural effusions. Upper Abdomen: Visualized upper abdominal structures appear unremarkable. 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 No blastic or lytic bone lesions. No evident chest wall lesions. Review of the MIP images confirms the above findings. IMPRESSION: 1. No evident pulmonary embolus. No thoracic aortic aneurysm or dissection. There are foci of aortic atherosclerosis as well as foci great vessel and coronary artery calcification. 2. Areas of somewhat ill-defined airspace opacity in the lower lung regions and inferior lingula, felt to represent multifocal pneumonia. Advise correlation with COVID-19 status check in this regard. No frank consolidation. 3. Small calcified granuloma right upper lobe. Scarring in the apices. 4.  No evident adenopathy. Aortic Atherosclerosis (ICD10-I70.0). Electronically Signed   By: Lowella Grip III M.D.   On: 07/27/2020 11:09   CARDIAC EVENT MONITOR  Result Date: 07/14/2020 Indication: Syncope Monitor duration: 11 days Minimum HR (bpm): 51 Maximum HR (bpm): 141 Supraventricular Ectopy: <1% SVT: none Ventricular Ectopy: <1%  NSVT: none Ventricular Tachycardia: none Pauses: none AV block: none Atrial fibrillation: none Diary events: none   Rare ectopy, no findings to support an etiology for syncope.  DG CHEST PORT 1 VIEW  Result Date: 07/28/2020 CLINICAL DATA:  Shortness of breath. EXAM: PORTABLE CHEST 1 VIEW COMPARISON:  CT 07/27/2020.  Chest x-ray 07/27/2020. FINDINGS: Mediastinum and hilar structures normal. Heart size normal. Persistent mild bibasilar atelectasis and interstitial prominence. No focal alveolar infiltrates. No pleural effusion or pneumothorax. IMPRESSION: Persistent mild bibasilar atelectasis and interstitial prominence. No focal alveolar infiltrate. Chest is unchanged from prior exams. Electronically Signed   By: Marcello Moores  Register   On: 07/28/2020 06:53   DG Chest Port 1 View  Result Date: 07/27/2020 CLINICAL DATA:  Hypoxia. EXAM: PORTABLE CHEST 1 VIEW COMPARISON:  07/25/2020. FINDINGS: Mediastinum and hilar structures normal. Interim improvement of bilateral interstitial prominence. Low lung volumes with mild bibasilar atelectasis. No  pleural effusion or pneumothorax. No acute bony abnormality. IMPRESSION: Interim improvement of bilateral interstitial prominence. Low lung volumes with mild bibasilar atelectasis. Electronically Signed   By: Marcello Moores  Register   On: 07/27/2020 06:04   ECHOCARDIOGRAM COMPLETE  Result Date: 07/27/2020    ECHOCARDIOGRAM REPORT   Patient Name:   Melinda Griffin Date of Exam: 07/27/2020 Medical Rec #:  [id number]                 Height:       61.0 in Accession #:    [id number]                Weight:       121.3 lb Date of Birth:  [birth year]                  BSA:          1.527 m Patient Age:    64 years                  BP:           129/56 mmHg Patient Gender: F                         HR:           78 bpm. Exam Location:  Inpatient Procedure: 2D Echo, Cardiac Doppler and Color Doppler Indications:    R55 Syncope  History:        Patient has no prior history of  Echocardiogram examinations.                 Signs/Symptoms:Altered Mental Status, Shortness of Breath and                 Dyspnea; Risk Factors:Dyslipidemia. Hypoxia.  Sonographer:    Roseanna Rainbow RDCS Referring Phys: [id number] [place] [facility]  Sonographer Comments: Technically difficult study due to poor echo windows. Image acquisition challenging due to uncooperative patient. Patient confused and would not allow me to turn her for exam. IMPRESSIONS  1. Left ventricular ejection fraction, by estimation, is 60 to 65%. The left ventricle has normal function. The left ventricle has no regional wall motion abnormalities. Left ventricular diastolic parameters are consistent with Grade I diastolic dysfunction (impaired relaxation).  2. Right ventricular systolic function is normal. The right ventricular size is normal.  3. The mitral valve is normal in structure. Trivial mitral valve regurgitation. No evidence of mitral stenosis.  4. The aortic valve has an indeterminant number of cusps. Aortic valve regurgitation is not visualized. No aortic stenosis is present.  5. The inferior vena cava is normal in size with greater than 50% respiratory variability, suggesting right atrial pressure of 3 mmHg. FINDINGS  Left Ventricle: Left ventricular ejection fraction, by estimation, is 60 to 65%. The left ventricle has normal function. The left ventricle has no regional wall motion abnormalities. The left ventricular internal cavity size was normal in size. There is  no left ventricular hypertrophy. Left ventricular diastolic parameters are consistent with Grade I diastolic dysfunction (impaired relaxation). Right Ventricle: The right ventricular size is normal. Right ventricular systolic function is normal. Left Atrium: Left atrial size was normal in size. Right Atrium: Right atrial size was normal in size. Pericardium: There is no evidence of pericardial effusion. Mitral Valve: The mitral valve is normal in structure. Mild mitral  annular calcification. Trivial mitral valve regurgitation. No evidence of mitral valve stenosis. Tricuspid Valve: The tricuspid  valve is normal in structure. Tricuspid valve regurgitation is trivial. No evidence of tricuspid stenosis. Aortic Valve: The aortic valve has an indeterminant number of cusps. Aortic valve regurgitation is not visualized. No aortic stenosis is present. Pulmonic Valve: The pulmonic valve was not well visualized. Pulmonic valve regurgitation is not visualized. No evidence of pulmonic stenosis. Aorta: The aortic root is normal in size and structure. Venous: The inferior vena cava is normal in size with greater than 50% respiratory variability, suggesting right atrial pressure of 3 mmHg.   LEFT VENTRICLE PLAX 2D LVIDd:         3.67 cm     Diastology LVIDs:         2.57 cm     LV e' medial:    7.83 cm/s LV PW:         0.96 cm     LV E/e' medial:  13.5 LV IVS:        1.00 cm     LV e' lateral:   8.05 cm/s LVOT diam:     2.10 cm     LV E/e' lateral: 13.2 LV SV:         72 LV SV Index:   47 LVOT Area:     3.46 cm  LV Volumes (MOD) LV vol d, MOD A2C: 42.9 ml LV vol d, MOD A4C: 50.2 ml LV vol s, MOD A2C: 15.9 ml LV vol s, MOD A4C: 20.6 ml LV SV MOD A2C:     27.0 ml LV SV MOD A4C:     50.2 ml LV SV MOD BP:      27.4 ml RIGHT VENTRICLE            IVC RV S prime:     9.79 cm/s  IVC diam: 1.58 cm TAPSE (M-mode): 1.8 cm LEFT ATRIUM             Index       RIGHT ATRIUM           Index LA diam:        3.40 cm 2.23 cm/m  RA Area:     10.40 cm LA Vol (A2C):   30.6 ml 20.04 ml/m RA Volume:   24.10 ml  15.78 ml/m LA Vol (A4C):   23.3 ml 15.26 ml/m LA Biplane Vol: 29.5 ml 19.32 ml/m  AORTIC VALVE LVOT Vmax:   112.00 cm/s LVOT Vmean:  69.900 cm/s LVOT VTI:    0.208 m  AORTA Ao Root diam: 3.00 cm MITRAL VALVE MV Area (PHT): 4.06 cm     SHUNTS MV Decel Time: 187 msec     Systemic VTI:  0.21 m MV E velocity: 106.00 cm/s  Systemic Diam: 2.10 cm MV A velocity: 107.00 cm/s MV E/A ratio:  0.99 Kirk Ruths MD  Electronically signed by Kirk Ruths MD Signature Date/Time: 07/27/2020/11:29:30 AM    Final    VAS [place] LOWER EXTREMITY VENOUS (DVT)  Result Date: 07/28/2020  Lower Venous DVT Study Indications: Elevated Ddimer.  Risk Factors: None identified. Comparison Study: No prior studies. Performing Technologist: Oliver Hum RVT  Examination Guidelines: A complete evaluation includes B-mode imaging, spectral Doppler, color Doppler, and power Doppler as needed of all accessible portions of each vessel. Bilateral testing is considered an integral part of a complete examination. Limited examinations for reoccurring indications may be performed as noted. The reflux portion of the exam is performed with the patient in reverse Trendelenburg.  +---------+---------------+---------+-----------+----------+--------------+ RIGHT    CompressibilityPhasicitySpontaneityPropertiesThrombus Aging +---------+---------------+---------+-----------+----------+--------------+  CFV      Full           Yes      Yes                                 +---------+---------------+---------+-----------+----------+--------------+ SFJ      Full                                                        +---------+---------------+---------+-----------+----------+--------------+ FV Prox  Full                                                        +---------+---------------+---------+-----------+----------+--------------+ FV Mid   Full                                                        +---------+---------------+---------+-----------+----------+--------------+ FV DistalFull                                                        +---------+---------------+---------+-----------+----------+--------------+ PFV      Full                                                        +---------+---------------+---------+-----------+----------+--------------+ POP      Full           Yes      Yes                                  +---------+---------------+---------+-----------+----------+--------------+ PTV      Full                                                        +---------+---------------+---------+-----------+----------+--------------+ PERO     Full                                                        +---------+---------------+---------+-----------+----------+--------------+   +---------+---------------+---------+-----------+----------+--------------+ LEFT     CompressibilityPhasicitySpontaneityPropertiesThrombus Aging +---------+---------------+---------+-----------+----------+--------------+ CFV      Full           Yes      Yes                                 +---------+---------------+---------+-----------+----------+--------------+  SFJ      Full                                                        +---------+---------------+---------+-----------+----------+--------------+ FV Prox  Full                                                        +---------+---------------+---------+-----------+----------+--------------+ FV Mid   Full                                                        +---------+---------------+---------+-----------+----------+--------------+ FV DistalFull                                                        +---------+---------------+---------+-----------+----------+--------------+ PFV      Full                                                        +---------+---------------+---------+-----------+----------+--------------+ POP      Full           Yes      Yes                                 +---------+---------------+---------+-----------+----------+--------------+ PTV      Full                                                        +---------+---------------+---------+-----------+----------+--------------+ PERO     Full                                                         +---------+---------------+---------+-----------+----------+--------------+     Summary: RIGHT: - There is no evidence of deep vein thrombosis in the lower extremity.  - No cystic structure found in the popliteal fossa.  LEFT: - There is no evidence of deep vein thrombosis in the lower extremity.  - No cystic structure found in the popliteal fossa.  *See table(s) above for measurements and observations. Electronically signed by Jamelle Haring on 07/28/2020 at 1:45:40 PM.    Final      Time coordinating discharge: Over 8 minutes    Melinda Dee, MD  [facility] 07/31/2020, 9:36 AM

## 2020-07-31 NOTE — Progress Notes (Signed)
Report called to [name], spoke to [name].

## 2020-08-01 ENCOUNTER — Other Ambulatory Visit: Payer: Self-pay

## 2020-08-01 ENCOUNTER — Emergency Department (HOSPITAL_COMMUNITY)
Admission: EM | Admit: 2020-08-01 | Discharge: 2020-08-01 | Disposition: A | Discharge status: Home / Self Care | Payer: Medicare Other | Attending: Emergency Medicine | Admitting: Emergency Medicine

## 2020-08-01 ENCOUNTER — Emergency Department (HOSPITAL_COMMUNITY): Payer: Medicare Other

## 2020-08-01 ENCOUNTER — Encounter (HOSPITAL_COMMUNITY): Payer: Self-pay

## 2020-08-01 DIAGNOSIS — J45909 Unspecified asthma, uncomplicated: Secondary | ICD-10-CM | POA: Insufficient documentation

## 2020-08-01 DIAGNOSIS — F0390 Unspecified dementia without behavioral disturbance: Secondary | ICD-10-CM | POA: Insufficient documentation

## 2020-08-01 DIAGNOSIS — W01198A Fall on same level from slipping, tripping and stumbling with subsequent striking against other object, initial encounter: Secondary | ICD-10-CM | POA: Insufficient documentation

## 2020-08-01 DIAGNOSIS — S0191XA Laceration without foreign body of unspecified part of head, initial encounter: Secondary | ICD-10-CM | POA: Diagnosis not present

## 2020-08-01 DIAGNOSIS — W19XXXA Unspecified fall, initial encounter: Secondary | ICD-10-CM

## 2020-08-01 DIAGNOSIS — S0990XA Unspecified injury of head, initial encounter: Secondary | ICD-10-CM | POA: Diagnosis present

## 2020-08-01 LAB — LEGIONELLA PNEUMOPHILA SEROGP 1 UR AG: L. pneumophila Serogp 1 Ur Ag: NEGATIVE

## 2020-08-01 MED ORDER — ACETAMINOPHEN 500 MG PO TABS
1000.0000 mg | ORAL_TABLET | Freq: Once | ORAL | Status: AC
  Administered 2020-08-01: 1000 mg via ORAL
  Filled 2020-08-01: qty 2

## 2020-08-01 NOTE — ED Notes (Signed)
Pt went home with daughter. ptar called and canceled

## 2020-08-01 NOTE — ED Triage Notes (Signed)
[facility], from [place] , fell today and hit the back of head, bruising on RT elbow. Pt has dementia. Family on the way. 138/68 H 80 RA 99

## 2020-08-01 NOTE — Telephone Encounter (Signed)
[name], please see mychart message sent by pt's daughter [name]:  To: [facility] POOL    From: Melinda Griffin    Created: 07/31/2020 2:26 PM     *-*-*This message has not been handled.*-*-*  Hi, Melinda Griffin is my mom. She is in [employer] for at least 5 days. Several items: 1) cancel her follow up visit. 2) we have te oxygen tank that was sent home w [place], please advise 3) please cancel oxygen order w [facility] 4) I received her nebulizer today (finally) and it did not include a mask. Mom cannot hold the regular attachment in her mouth. Could you please inform [facility] that she needs an adult small mask. Thanks     Pt's appt that was originally scheduled with you for a 1 week f/u has been already cancelled.  Please advise if you are okay with [place] placing an order to [facility] for pt to receive an adult small mask so she can be able to use with the nebulizer. Also, in the order to [facility], we need to place in there for them to pick up the loaner tank from pt's home.

## 2020-08-01 NOTE — Telephone Encounter (Signed)
We can order full face mask for nebulizer. She still likely needs to establish with [facility] for oxygen, would need a home concentrator as well as tanks. Ok to cancel follow-up.

## 2020-08-01 NOTE — ED Provider Notes (Signed)
Medical Decision Making: Care of patient assumed from Dr. Sedonia Small at [phone].  Agree with history, physical exam and plan.  See their note for further details.  Briefly, The pt p/w fall from SNF, family concerned about care and wants SW consult vs DC to daughters home.   Current plan is as follows: CTs  CT imaging reviewed by radiology myself shows no acute fracture or malalignment.  No intracranial process.  Social work has set up for home health equipment and healthcare providers to see her.  Patient feels comfortable discharge with family.  I personally reviewed and interpreted all labs/imaging.      Breck Coons, MD 08/01/20 [phone]

## 2020-08-01 NOTE — TOC Initial Note (Addendum)
Transition of Care [facility]) - Initial/Assessment Note    Patient Details  Name: Melinda Griffin MRN: [id number] Date of Birth: [birth year]  [facility]) CM/SW Contact:    Erenest Rasher, RN Phone Number: [phone] 08/01/2020, 5:27 PM  Clinical Narrative:                 TOC CM spoke to dtr, Melinda and states she agreeable to [facility]. She does not want her to go back to [facility]. Offered choice she agreeable to [facility]. Referral sent to Amy, [facility] rep. Pt has neb machine that she received in the mail on today. She is needing a mask, Rollator and bedside commode. Orders placed and called to [place]. Will have delivered to pt's bedside in ED. Ed provider and ED RN updated. Will need HHRN, PT, OT and aide orders and DME order need to be signed.   Daughter made aware pt will need appt with PCP within 30 days so PCP can sign HH orders. Dtr declined Rollator, it was too big. Will have [employer] her account.  Will need PTAR transportation home.   08/02/2020 1:00 pm [facility] unable to accept referral.   Expected Discharge Plan: [place] Barriers to Discharge: No Barriers Identified   Patient Goals and CMS Choice Patient states their goals for this hospitalization and ongoing recovery are:: prefers to go home CMS Medicare.gov Compare Post Acute Care list provided to:: Patient Represenative (must comment) (dtr, Melinda Griffin) Choice offered to / list presented to : Adult Children  Expected Discharge Plan and Services Expected Discharge Plan: [place] In-house Referral: Clinical Social Work Discharge Planning Services: CM Consult Post Acute Care Choice: [place] arrangements for the past 2 months: [place]                 DME Arranged: 3-N-1, Walker rolling with seat DME Agency: [facility] Date DME Agency Contacted: 08/01/20 Time DME Agency Contacted: 90 Representative spoke with at DME  Agency: [facility] HH Arranged: RN, PT, OT, Nurse's Aide, Social Work [place] Date [place]: 08/01/20 Time [place]: [place]    Prior Living Arrangements/Services Living arrangements for the past 2 months: [place] with:: Adult Children Patient language and need for interpreter reviewed:: Yes Do you feel safe going back to the place where you live?: Yes      Need for Family Participation in Patient Care: Yes (Comment) Care giver support system in place?: Yes (comment) Current home services: DME (nebulizer machine) Criminal Activity/Legal Involvement Pertinent to Current Situation/Hospitalization: No - Comment as needed  Activities of Daily Living      Permission Sought/Granted Permission sought to share information with : Case Manager, PCP, Facility Sport and exercise psychologist, Family Supports Permission granted to share information with : Yes, Verbal Permission Granted  Share Information with NAME: Melinda Griffin  Permission granted to share info w AGENCY: [place] granted to share info w Relationship: daughter  Permission granted to share info w Contact Information: [phone]  Emotional Assessment       Orientation: : Oriented to Self   Psych Involvement: No (comment)  Admission diagnosis:  Fall, lac to back of head Patient Active Problem List   Diagnosis Date Noted  . Malnutrition of moderate degree 07/28/2020  . Acute respiratory failure ([place]) 07/25/2020  . 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 Dysphagia 05/26/2020  .  Community acquired pneumonia of right lower lobe of lung   . Acute metabolic encephalopathy 72/82/0601  . Weight loss, non-intentional 12/08/2017  . Relationship problem between partners 04/14/2017  . 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 H/O migraine 05/11/2015  . Dupuytren's contracture 07/13/2014  . Encounter for therapeutic drug monitoring 12/31/2013  . Hyperlipidemia  11/29/2013  . Anxiety 11/29/2013  . Unspecified vitamin D deficiency 11/30/2012  . Depression 11/30/2012  . Mild cognitive impairment with memory loss 11/30/2012  . Hyperglycemia 11/30/2012  . Allergic rhinitis 05/09/2010   PCP:  Seward Carol, MD Pharmacy:   [facility] [place], [place] [address] Phone: [phone] Fax: [phone]  Pharmacy, [place], [place] [address] [place] [address] Phone: [phone] Fax: [phone]     Social Determinants of Health (SDOH) Interventions    Readmission Risk Interventions No flowsheet data found.

## 2020-08-01 NOTE — Addendum Note (Signed)
Addended by: Lorretta Harp on: 08/01/2020 01:53 PM   Modules accepted: Orders

## 2020-08-02 ENCOUNTER — Observation Stay (HOSPITAL_COMMUNITY): Payer: Medicare Other

## 2020-08-02 ENCOUNTER — Ambulatory Visit: Payer: Medicare Other | Admitting: Primary Care

## 2020-08-02 ENCOUNTER — Observation Stay (HOSPITAL_COMMUNITY)
Admission: EM | Admit: 2020-08-02 | Discharge: 2020-08-03 | Disposition: A | Discharge status: Home / Self Care | Payer: Medicare Other | Attending: Internal Medicine | Admitting: Internal Medicine

## 2020-08-02 ENCOUNTER — Encounter (HOSPITAL_COMMUNITY): Payer: Self-pay

## 2020-08-02 DIAGNOSIS — F0390 Unspecified dementia without behavioral disturbance: Secondary | ICD-10-CM | POA: Insufficient documentation

## 2020-08-02 DIAGNOSIS — J45909 Unspecified asthma, uncomplicated: Secondary | ICD-10-CM | POA: Diagnosis not present

## 2020-08-02 DIAGNOSIS — Z79899 Other long term (current) drug therapy: Secondary | ICD-10-CM | POA: Diagnosis not present

## 2020-08-02 DIAGNOSIS — Z20822 Contact with and (suspected) exposure to covid-19: Secondary | ICD-10-CM | POA: Diagnosis not present

## 2020-08-02 DIAGNOSIS — R569 Unspecified convulsions: Principal | ICD-10-CM | POA: Insufficient documentation

## 2020-08-02 DIAGNOSIS — R55 Syncope and collapse: Secondary | ICD-10-CM | POA: Diagnosis not present

## 2020-08-02 DIAGNOSIS — E7800 Pure hypercholesterolemia, unspecified: Secondary | ICD-10-CM | POA: Diagnosis not present

## 2020-08-02 DIAGNOSIS — E785 Hyperlipidemia, unspecified: Secondary | ICD-10-CM | POA: Diagnosis present

## 2020-08-02 LAB — URINALYSIS, ROUTINE W REFLEX MICROSCOPIC
Bacteria, UA: NONE SEEN
Glucose, UA: NEGATIVE mg/dL
Hgb urine dipstick: NEGATIVE
Ketones, ur: 20 mg/dL — AB
Nitrite: NEGATIVE
Protein, ur: 30 mg/dL — AB
Specific Gravity, Urine: 1.028 (ref 1.005–1.030)
pH: 5 (ref 5.0–8.0)

## 2020-08-02 LAB — COMPREHENSIVE METABOLIC PANEL
ALT: 63 U/L — ABNORMAL HIGH (ref 0–44)
AST: 130 U/L — ABNORMAL HIGH (ref 15–41)
Albumin: 3.2 g/dL — ABNORMAL LOW (ref 3.5–5.0)
Alkaline Phosphatase: 61 U/L (ref 38–126)
Anion gap: 14 (ref 5–15)
BUN: 19 mg/dL (ref 8–23)
CO2: 21 mmol/L — ABNORMAL LOW (ref 22–32)
Calcium: 9.1 mg/dL (ref 8.9–10.3)
Chloride: 103 mmol/L (ref 98–111)
Creatinine, Ser: 0.99 mg/dL (ref 0.44–1.00)
GFR, Estimated: 57 mL/min — ABNORMAL LOW (ref >60)
Glucose, Bld: 147 mg/dL — ABNORMAL HIGH (ref 70–99)
Potassium: 3.7 mmol/L (ref 3.5–5.1)
Sodium: 138 mmol/L (ref 135–145)
Total Bilirubin: 0.6 mg/dL (ref 0.3–1.2)
Total Protein: 7.6 g/dL (ref 6.5–8.1)

## 2020-08-02 LAB — CBC WITH DIFFERENTIAL/PLATELET
Abs Immature Granulocytes: 0.06 10*3/uL (ref 0.00–0.07)
Basophils Absolute: 0.1 10*3/uL (ref 0.0–0.1)
Basophils Relative: 0 %
Eosinophils Absolute: 0.4 10*3/uL (ref 0.0–0.5)
Eosinophils Relative: 3 %
HCT: 38.1 % (ref 36.0–46.0)
Hemoglobin: 12.9 g/dL (ref 12.0–15.0)
Immature Granulocytes: 0 %
Lymphocytes Relative: 10 %
Lymphs Abs: 1.4 10*3/uL (ref 0.7–4.0)
MCH: 31 pg (ref 26.0–34.0)
MCHC: 33.9 g/dL (ref 30.0–36.0)
MCV: 91.6 fL (ref 80.0–100.0)
Monocytes Absolute: 0.9 10*3/uL (ref 0.1–1.0)
Monocytes Relative: 6 %
Neutro Abs: 11.7 10*3/uL — ABNORMAL HIGH (ref 1.7–7.7)
Neutrophils Relative %: 81 %
Platelets: 399 10*3/uL (ref 150–400)
RBC: 4.16 MIL/uL (ref 3.87–5.11)
RDW: 13.3 % (ref 11.5–15.5)
WBC: 14.5 10*3/uL — ABNORMAL HIGH (ref 4.0–10.5)
nRBC: 0 % (ref 0.0–0.2)

## 2020-08-02 LAB — CBC
HCT: 36 % (ref 36.0–46.0)
Hemoglobin: 12.2 g/dL (ref 12.0–15.0)
MCH: 31 pg (ref 26.0–34.0)
MCHC: 33.9 g/dL (ref 30.0–36.0)
MCV: 91.6 fL (ref 80.0–100.0)
Platelets: 392 10*3/uL (ref 150–400)
RBC: 3.93 MIL/uL (ref 3.87–5.11)
RDW: 13.4 % (ref 11.5–15.5)
WBC: 12.2 10*3/uL — ABNORMAL HIGH (ref 4.0–10.5)
nRBC: 0 % (ref 0.0–0.2)

## 2020-08-02 LAB — CREATININE, SERUM
Creatinine, Ser: 0.82 mg/dL (ref 0.44–1.00)
GFR, Estimated: >60 mL/min (ref >60)

## 2020-08-02 LAB — RESP PANEL BY RT-PCR (FLU A&B, COVID) ARPGX2
Influenza A by PCR: NEGATIVE
Influenza B by PCR: NEGATIVE
SARS Coronavirus 2 by RT PCR: NEGATIVE

## 2020-08-02 LAB — MAGNESIUM: Magnesium: 1.7 mg/dL (ref 1.7–2.4)

## 2020-08-02 LAB — CBG MONITORING, ED: Glucose-Capillary: 146 mg/dL — ABNORMAL HIGH (ref 70–99)

## 2020-08-02 MED ORDER — CITALOPRAM HYDROBROMIDE 20 MG PO TABS
40.0000 mg | ORAL_TABLET | Freq: Every day | ORAL | Status: DC
  Administered 2020-08-02: 40 mg via ORAL

## 2020-08-02 MED ORDER — ACETAMINOPHEN 650 MG RE SUPP: 650.0000 mg | Freq: Four times a day (QID) | RECTAL | Status: DC | PRN

## 2020-08-02 MED ORDER — MIRTAZAPINE 15 MG PO TABS
15.0000 mg | ORAL_TABLET | Freq: Every evening | ORAL | Status: DC
  Administered 2020-08-02: 15 mg via ORAL
  Filled 2020-08-02: qty 1

## 2020-08-02 MED ORDER — ACETAMINOPHEN 325 MG PO TABS
650.0000 mg | ORAL_TABLET | Freq: Four times a day (QID) | ORAL | Status: DC | PRN
  Administered 2020-08-02: 650 mg via ORAL
  Filled 2020-08-02: qty 2

## 2020-08-02 MED ORDER — MONTELUKAST SODIUM 10 MG PO TABS
10.0000 mg | ORAL_TABLET | Freq: Every evening | ORAL | Status: DC
  Administered 2020-08-02: 10 mg via ORAL
  Filled 2020-08-02: qty 1

## 2020-08-02 MED ORDER — OLANZAPINE 5 MG PO TABS
2.5000 mg | ORAL_TABLET | Freq: Every day | ORAL | Status: DC | PRN
  Administered 2020-08-02: 2.5 mg via ORAL
  Filled 2020-08-02 (×2): qty 1

## 2020-08-02 MED ORDER — ENOXAPARIN SODIUM 30 MG/0.3ML ~~LOC~~ SOLN
30.0000 mg | SUBCUTANEOUS | Status: DC
  Administered 2020-08-02: 30 mg via SUBCUTANEOUS
  Filled 2020-08-02 (×2): qty 0.3

## 2020-08-02 MED ORDER — LEVETIRACETAM 250 MG PO TABS
250.0000 mg | ORAL_TABLET | Freq: Two times a day (BID) | ORAL | Status: DC
  Administered 2020-08-02 (×2): 250 mg via ORAL
  Filled 2020-08-02 (×4): qty 1

## 2020-08-02 MED ORDER — PRAVASTATIN SODIUM 40 MG PO TABS: 40.0000 mg | ORAL_TABLET | Freq: Every day | ORAL | Status: DC

## 2020-08-02 MED ORDER — LORATADINE 10 MG PO TABS
10.0000 mg | ORAL_TABLET | Freq: Every day | ORAL | Status: DC
  Administered 2020-08-02: 10 mg via ORAL
  Filled 2020-08-02 (×2): qty 1

## 2020-08-02 MED ORDER — ALBUTEROL SULFATE (2.5 MG/3ML) 0.083% IN NEBU: 2.5000 mg | INHALATION_SOLUTION | Freq: Four times a day (QID) | RESPIRATORY_TRACT | Status: DC | PRN

## 2020-08-02 MED ORDER — MIDAZOLAM HCL 2 MG/2ML IJ SOLN: 2.0000 mg | INTRAMUSCULAR | Status: DC | PRN

## 2020-08-02 NOTE — Progress Notes (Signed)
Patient arrived to the floor only alert to self.  Patient daughter notified of patient admission and room number.  Patient originally pleasant and cooperative but quickly changed to climbing out of bed and trying to leave, pulling ar devices, and removing gown.  Staff unsuccessful at redirecting patient.  Patient's daughter notified, and came to help with patient.  Daughter able to get patient back into gown, into bed and asleep.  Both patient and daughter are declining telemetry at this time.  MD notified of telemetry refusal. Patient currently resting in bed with bed alarm on.

## 2020-08-02 NOTE — Consult Note (Signed)
Neurology Consult H&P  CC: paroxysmal episodes of shaking  History taken from  Chart as patient was nonverbal and nobody else at bedside.  HPI: Melinda Griffin is a 79 y.o. female HLD, syncope (recurrent), anxiety, dementia with syncopal event today and EMS witnessed event described as shaking for ~30 seconds which spontaneously subsided followed by post-ictal confusion ~5 minutes. Episode was associated with incontinent of urine. No tongue biting.   No history of seizures.   Per chart denied headache, chest pain, SOB, nausea.   ROS: A complete ROS was performed and is negative except as noted in the HPI.  Past Medical History:  Diagnosis Date  . Allergic rhinitis   . Anxiety   . Asthma   . Hyperlipidemia   . Migraine with aura, without mention of intractable migraine without mention of status migrainosus     Family History  Problem Relation Age of Onset  . Asthma Father   . Asthma Sister   . Heart disease Sister   . Cancer Mother        brain, breast  . Cancer Brother 44  . Heart disease Brother   . Hypertension Brother     Social History:  reports that she has never smoked. She has never used smokeless tobacco. She reports that she does not drink alcohol and does not use drugs.   Prior to Admission medications   Medication Sig Start Date End Date Taking? Authorizing Provider  albuterol (PROVENTIL) (2.5 MG/3ML) 0.083% nebulizer solution Take 3 mLs (2.5 mg total) by nebulization every 6 (six) hours as needed for wheezing or shortness of breath. 07/27/20   Martyn Ehrich, NP  albuterol (VENTOLIN HFA) 108 (90 Base) MCG/ACT inhaler Inhale 1-2 puffs into the lungs every 6 (six) hours as needed for wheezing or shortness of breath.  02/21/20   [provider]  budesonide (PULMICORT) 0.25 MG/2ML nebulizer solution Take 2 mLs (0.25 mg total) by nebulization 2 (two) times daily. Partial refill ok 07/25/20   Martyn Ehrich, NP  Calcium Carb-Cholecalciferol  (CALCIUM 500 + D) 500-200 MG-UNIT TABS Take 1 tablet by mouth daily.    [provider]  cetirizine (ZYRTEC) 10 MG tablet Take 10 mg by mouth daily. Take one tablet once daily for allergies     [provider]  citalopram (CELEXA) 40 MG tablet TAKE 1 TABLET BY MOUTH ONCE DAILY Patient taking differently: Take 40 mg by mouth daily.  05/04/18   Reed, Tiffany L, DO  lovastatin (MEVACOR) 40 MG tablet TAKE 1 TABLET BY MOUTH EVERY DAY TO LOWER CHOLESTEROL Patient taking differently: Take 40 mg by mouth every evening.  07/03/18   Reed, Tiffany L, DO  Melatonin 10 MG TABS Take 10 mg by mouth at bedtime as needed (sleep).    [provider]  mirtazapine (REMERON) 15 MG tablet Take 15 mg by mouth every evening.  02/24/20   [provider]  montelukast (SINGULAIR) 10 MG tablet TAKE 1 TABLET BY MOUTH EVERY DAY Patient taking differently: Take 10 mg by mouth every evening.  12/01/18   Reed, Tiffany L, DO  omeprazole (PRILOSEC) 10 MG capsule Take 10 mg by mouth in the morning and at bedtime.    [provider]   Exam: Current vital signs: BP (!) 103/55   Pulse 81   Temp 97.6 F (36.4 C) (Oral)   Resp 18   SpO2 94%   Physical Exam  Constitutional: Appears well-developed and well-nourished.  Psych: unable to assess do  to altered mental status. Eyes: No scleral injection HENT: No OP obstrucion Head: Normocephalic.  Cardiovascular: Normal rate and regular rhythm.  Respiratory: Effort normal and breath sounds normal to anterior ascultation GI: Soft.  No distension. There is no tenderness.  Skin: WDI  Neuro: Mental Status: unable to assess do to altered mental status. No signs of aphasia or neglect. Cranial Nerves: II: Visual Fields are full to confrontation. Pupils are equal, round, and reactive to light. III,IV, VI: EOMI without ptosis or diploplia.  V: Facial sensation is symmetric to temperature VII: Facial movement is symmetric.  VIII: hearing is intact  to voice X: Uvula elevates symmetrically XI: Shoulder shrug is symmetric. XII: tongue is midline without atrophy or fasciculations.  Motor: Tone is normal. Bulk is normal. Move all four extremities with good strenght. Sensory: Sensation is symmetric to moderate stimulus. Deep Tendon Reflexes: 2+ and symmetric in the biceps and patellae. Plantars: Toes are downgoing bilaterally. Cerebellar: unable to assess do to altered mental status.  I have reviewed the images obtained: NCT head showed No evidence of acute intracranial injury.   Assessment: Melinda Griffin is a 79 y.o. female PMHx HLD, syncope (recurrent), anxiety, dementia with paroxysmal events of shaking followed by confusion lasting ~30 seconds. By description events are suggestive of seizures and will need further evaluation. She will need imaging to assess for structural/vascular etiology.   Plan: - MRI brain without contrast. - Routine EEG to eval for any epileptogenic discharges - Ordered. - Recommend metabolic/infectious workup to determine if episodes were provoked. - Based on imaging and/or EEG results if suggestive of epileptogenic focus will start antiseizure medication.  Electronically signed by: Dr. Lynnae Sandhoff Pager: [phone] 08/02/2020, 4:42 AM

## 2020-08-02 NOTE — Procedures (Signed)
Patient Name: Melinda Griffin  MRN: [id number]  Epilepsy Attending: Lora Havens  Referring Physician/Provider: Dr. Gean Birchwood Date: 08/02/2020 Duration: 26.05 mins  Patient history: 79 year old female with history of recurrent syncope presented with paroxysmal events of shaking followed by confusion lasting about 30 seconds. EEG to evaluate for seizures.  Level of alertness: Awake  AEDs during EEG study: None  Technical aspects: This EEG study was done with scalp electrodes positioned according to the 10-20 International system of electrode placement. Electrical activity was acquired at a sampling rate of 500Hz  and reviewed with a high frequency filter of 70Hz  and a low frequency filter of 1Hz . EEG data were recorded continuously and digitally stored.   Description: No clear posterior dominant rhythm was seen.  EEG showed continuous generalized polymorphic 3 to 6 Hz theta-delta slowing.  Hyperventilation and photic stimulation were not performed.     ABNORMALITY -Continuous slow, generalized  IMPRESSION: This study is suggestive of moderate diffuse encephalopathy, nonspecific etiology. No seizures or epileptiform discharges were seen throughout the recording.  [name]

## 2020-08-02 NOTE — ED Provider Notes (Signed)
[place] EMERGENCY DEPARTMENT Provider Note   CSN: [id number] Arrival date & time: 08/02/20  0000     History Chief Complaint  Patient presents with  . Seizures    LEVEL 5 CAVEAT 2/2 DEMENTIA  Melinda Griffin is a 79 y.o. female.  79 y/o female with hx of HLD, recurrent syncope, anxiety, and dementia presents to the ED via EMS. Patient coming from home. She was stated to have had a syncopal event today. EMS also reports a witnessed episode of syncope vs seizure. Patient reported to have been shaking for ~30 seconds and altered. This spontaneously subsided with post-ictal period of about 5 minutes. She was incontinent of urine. Had no tongue biting. No hx of seizures. Patient presently denies headache, chest pain, SOB, nausea. She states her suprapubic abdomen was previously "sore", but it is not sore currently.  Lives with daughter at this time. Previously lived with her daughter but was discharged from the hospital to [facility] on 07/31/20 after 5-day hospital admission for ARF 2/2 viral CAP vs pneumonitis. Treated with abx while inpatient. Was seen in the ED yesterday after a fall at SNF. Discharged back into daughter's care due to family's concerns surrounding appropriate care being received at facility.  Patient is DNR/DNI  The history is provided by the patient. No language interpreter was used.  Seizures      Past Medical History:  Diagnosis Date  . Allergic rhinitis   . Anxiety   . Asthma   . Hyperlipidemia   . Migraine with aura, without mention of intractable migraine without mention of status migrainosus     Patient Active Problem List   Diagnosis Date Noted  . Syncope 08/02/2020  . Malnutrition of moderate degree 07/28/2020  . Acute respiratory failure ([place]) 07/25/2020  . 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 Dysphagia 05/26/2020  . Community acquired pneumonia of right lower lobe of lung   . Acute metabolic  encephalopathy 29/93/7169  . Weight loss, non-intentional 12/08/2017  . Relationship problem between partners 04/14/2017  . 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 H/O migraine 05/11/2015  . Dupuytren's contracture 07/13/2014  . Encounter for therapeutic drug monitoring 12/31/2013  . Hyperlipidemia 11/29/2013  . Anxiety 11/29/2013  . Unspecified vitamin D deficiency 11/30/2012  . Depression 11/30/2012  . Mild cognitive impairment with memory loss 11/30/2012  . Hyperglycemia 11/30/2012  . Allergic rhinitis 05/09/2010    Past Surgical History:  Procedure Laterality Date  . APPENDECTOMY  1975  . BREAST BIOPSY Left 1976  . TUBAL LIGATION Bilateral 1976     OB History   No obstetric history on file.     Family History  Problem Relation Age of Onset  . Asthma Father   . Asthma Sister   . Heart disease Sister   . Cancer Mother        brain, breast  . Cancer Brother 62  . Heart disease Brother   . Hypertension Brother     Social History   Tobacco Use  . Smoking status: Never Smoker  . Smokeless tobacco: Never Used  . Tobacco comment: smoked  briefly in early 60's less than a year  Vaping Use  . Vaping Use: Never used  Substance Use Topics  . Alcohol use: No    Alcohol/week: 0.0 standard drinks  . Drug use: No    Home Medications Prior to Admission medications   Medication Sig Start Date End Date Taking? Authorizing Provider  albuterol (PROVENTIL) (2.5 MG/3ML) 0.083% nebulizer  solution Take 3 mLs (2.5 mg total) by nebulization every 6 (six) hours as needed for wheezing or shortness of breath. 07/27/20   Martyn Ehrich, NP  albuterol (VENTOLIN HFA) 108 (90 Base) MCG/ACT inhaler Inhale 1-2 puffs into the lungs every 6 (six) hours as needed for wheezing or shortness of breath.  02/21/20   [provider]  budesonide (PULMICORT) 0.25 MG/2ML nebulizer solution Take 2 mLs (0.25 mg total) by nebulization 2 (two) times daily. Partial refill ok 07/25/20   Martyn Ehrich, NP  Calcium Carb-Cholecalciferol (CALCIUM 500 + D) 500-200 MG-UNIT TABS Take 1 tablet by mouth daily.    [provider]  cetirizine (ZYRTEC) 10 MG tablet Take 10 mg by mouth daily. Take one tablet once daily for allergies     [provider]  citalopram (CELEXA) 40 MG tablet TAKE 1 TABLET BY MOUTH ONCE DAILY Patient taking differently: Take 40 mg by mouth daily.  05/04/18   Reed, Tiffany L, DO  lovastatin (MEVACOR) 40 MG tablet TAKE 1 TABLET BY MOUTH EVERY DAY TO LOWER CHOLESTEROL Patient taking differently: Take 40 mg by mouth every evening.  07/03/18   Reed, Tiffany L, DO  Melatonin 10 MG TABS Take 10 mg by mouth at bedtime as needed (sleep).    [provider]  mirtazapine (REMERON) 15 MG tablet Take 15 mg by mouth every evening.  02/24/20   [provider]  montelukast (SINGULAIR) 10 MG tablet TAKE 1 TABLET BY MOUTH EVERY DAY Patient taking differently: Take 10 mg by mouth every evening.  12/01/18   Reed, Tiffany L, DO  omeprazole (PRILOSEC) 10 MG capsule Take 10 mg by mouth in the morning and at bedtime.    [provider]    Allergies    Shrimp [shellfish allergy], Aspirin, Ceftin, Ciprofloxacin hcl, Ativan [lorazepam], and Haldol [haloperidol]  Review of Systems   Review of Systems  Unable to perform ROS: Dementia  Neurological: Positive for seizures.    Physical Exam Updated Vital Signs BP (!) 106/53   Pulse 86   Temp 97.6 F (36.4 C) (Oral)   Resp (!) 22   SpO2 95%   Physical Exam Vitals and nursing note reviewed.  Constitutional:      General: She is not in acute distress.    Appearance: She is well-developed. She is not diaphoretic.     Comments: Patient in NAD  HENT:     Head: Normocephalic and atraumatic.     Right Ear: External ear normal.     Left Ear: External ear normal.     Mouth/Throat:     Comments: No signs of oral trauma. Eyes:     General: No scleral icterus.    Conjunctiva/sclera: Conjunctivae  normal.     Pupils: Pupils are equal, round, and reactive to light.     Comments: No appreciable nystagmus  Cardiovascular:     Rate and Rhythm: Normal rate and regular rhythm.     Pulses: Normal pulses.  Pulmonary:     Effort: Pulmonary effort is normal. No respiratory distress.     Breath sounds: No stridor. No wheezing, rhonchi or rales.     Comments: Lungs CTAB. Respirations even and unlabored. Abdominal:     Palpations: Abdomen is soft.     Tenderness: There is no abdominal tenderness. There is no guarding.     Comments: Soft, nondistended, nontender  Musculoskeletal:        General: Normal range of motion.  Cervical back: Normal range of motion.  Skin:    General: Skin is warm and dry.     Coloration: Skin is not pale.     Findings: No erythema or rash.  Neurological:     Mental Status: She is alert.     Comments: GCS 15. Speech is goal oriented. No cranial nerve deficits appreciated; symmetric eyebrow raise, no facial drooping, tongue midline. Patient has equal grip strength bilaterally with preserved and symmetric strength against resistance in all major muscle groups bilaterally. Sensation to light touch intact. No pronator drift. Ambulation not assessed.  Psychiatric:        Behavior: Behavior normal.     ED Results / Procedures / Treatments   Labs (all labs ordered are listed, but only abnormal results are displayed) Labs Reviewed  CBC WITH DIFFERENTIAL/PLATELET - Abnormal; Notable for the following components:      Result Value   WBC 14.5 (*)    Neutro Abs 11.7 (*)    All other components within normal limits  COMPREHENSIVE METABOLIC PANEL - Abnormal; Notable for the following components:   CO2 21 (*)    Glucose, Bld 147 (*)    Albumin 3.2 (*)    AST 130 (*)    ALT 63 (*)    GFR, Estimated 57 (*)    All other components within normal limits  URINALYSIS, ROUTINE W REFLEX MICROSCOPIC - Abnormal; Notable for the following components:   Color, Urine AMBER (*)     APPearance HAZY (*)    Bilirubin Urine MODERATE (*)    Ketones, ur 20 (*)    Protein, ur 30 (*)    Leukocytes,Ua TRACE (*)    All other components within normal limits  CBG MONITORING, ED - Abnormal; Notable for the following components:   Glucose-Capillary 146 (*)    All other components within normal limits  RESP PANEL BY RT-PCR (FLU A&B, COVID) ARPGX2  MAGNESIUM    EKG EKG Interpretation  Date/Time:  Wednesday August 02 2020 00:06:17 EST Ventricular Rate:  76 PR Interval:    QRS Duration: 101 QT Interval:  402 QTC Calculation: 452 R Axis:   -11 Text Interpretation: Sinus rhythm No significant change since last tracing Confirmed by Gareth [name] [phone]) on 08/02/2020 12:33:06 AM   Radiology CT HEAD WO CONTRAST  Result Date: 08/01/2020 CLINICAL DATA:  Fall EXAM: CT HEAD WITHOUT CONTRAST TECHNIQUE: Contiguous axial images were obtained from the base of the skull through the vertex without intravenous contrast. COMPARISON:  07/04/2020 FINDINGS: Brain: There is no acute intracranial hemorrhage, mass effect, or edema. Gray-white differentiation is preserved. There is no extra-axial fluid collection. Patchy and confluent areas of hypoattenuation in the supratentorial white matter are nonspecific a probably reflects stable chronic microvascular ischemic changes. Prominence of the ventricles and sulci reflects stable parenchymal volume loss. Vascular: There is mild atherosclerotic calcification at the skull base. Skull: Calvarium is unremarkable. Sinuses/Orbits: Layering secretions in the left sphenoid and maxillary sinuses. Evidence of chronic sinus inflammation. Other: Right posterior scalp soft tissue swelling IMPRESSION: No evidence of acute intracranial injury. Electronically Signed   By: Macy Mis M.D.   On: 08/01/2020 16:40   CT CERVICAL SPINE WO CONTRAST  Result Date: 08/01/2020 CLINICAL DATA:  Fall EXAM: CT CERVICAL SPINE WITHOUT CONTRAST TECHNIQUE: Multidetector CT  imaging of the cervical spine was performed without intravenous contrast. Multiplanar CT image reconstructions were also generated. COMPARISON:  March 2021 FINDINGS: Alignment: Anteroposterior alignment is maintained. Skull base and vertebrae: Stable vertebral  body heights. No acute cervical spine fracture. Soft tissues and spinal canal: No prevertebral fluid or swelling. No visible canal hematoma. Disc levels: Multilevel degenerative changes are stable in appearance. Upper chest: No apical lung mass. Other: Calcified plaque at the common carotid bifurcations. IMPRESSION: No acute cervical spine fracture. Electronically Signed   By: Macy Mis M.D.   On: 08/01/2020 16:43    Procedures Procedures (including critical care time)  ECHOCARDIOGRAM REPORT 07/27/2020 IMPRESSIONS  1. Left ventricular ejection fraction, by estimation, is 60 to 65%. The  left ventricle has normal function. The left ventricle has no regional  wall motion abnormalities. Left ventricular diastolic parameters are  consistent with Grade I diastolic  dysfunction (impaired relaxation).  2. Right ventricular systolic function is normal. The right ventricular  size is normal.  3. The mitral valve is normal in structure. Trivial mitral valve  regurgitation. No evidence of mitral stenosis.  4. The aortic valve has an indeterminant number of cusps. Aortic valve  regurgitation is not visualized. No aortic stenosis is present.  5. The inferior vena cava is normal in size with greater than 50%  respiratory variability, suggesting right atrial pressure of 3 mmHg.   Lower Venous DVT Study Summary:  RIGHT:  - There is no evidence of deep vein thrombosis in the lower extremity.  - No cystic structure found in the popliteal fossa.   LEFT:  - There is no evidence of deep vein thrombosis in the lower extremity.  - No cystic structure found in the popliteal fossa.  *See table(s) above for measurements and observations.    Electronically signed by Jamelle Haring on 07/28/2020 at 1:45:40 PM.     Medications Ordered in ED Medications - No data to display  ED Course  I have reviewed the triage vital signs and the nursing notes.  Pertinent labs & imaging results that were available during my care of the patient were reviewed by me and considered in my medical decision making (see chart for details).  Clinical Course as of Aug 02 341  Wed Aug 02, 2020  0057 Patient with hx of recurrent syncope. Recent hospitalization from 12/1-12/6. Seen yesterday, 08/01/20, for fall with negative head and C-spine CT. Now back in ED within 4 hours since ED discharge after syncopal event at home. EMS questioning witness seizure activity prior to transport - possibly convulsive syncope. No prior hx of seizures, though this is not an impossibility with advanced dementia. Will repeat labs, check CBG and EKG. Patient with no focal deficits or complaints at this time.   [KH]  0110 Chart reviewed. Had reassuring echocardiogram during recent admission. Also negative PE and DVT study. 10-day Holter monitor in November uneventful.  EKG today is stable; sinus rhythm.   [KD]  3267 Patient sleeping. Had long conversation with daughter at bedside. Daughter states that the episodes that occurred tonight are the same as those that have happened in the past. Patient does seem to have prodromal symptoms; states that she "doesn't feel well" or "needs to lay down" prior to them occurring. Episodes usually accompanied by emesis from mouth and nose and include shaking of bilateral arms. Tonight daughter saw shaking of b/l arms and legs. Usually will be immediately alert post-episode, but this was not the case today x 2 as she took longer to respond and "come around". No head trauma associated with either episode tonight.   [KH]  1245 Case discussed with MD Theda Sers of Neurology. Agrees with plan for hospital observation, likely EEG  and MRI. Plan to hold  antiepileptic medications at this time pending further work up. Consult placed to [facility] for admission.   [KH]  [phone] Daughter is OK with Zyprexa if patient experiences agitation. Historically poor reactions to Ativan and Haldol which worsen agitation. Daughter is also OK with safety mitts, if necessary.   [KH]    Clinical Course User Index [KH] Antonietta Breach, PA-C   MDM Rules/Calculators/A&P                          79 year old female to be admitted for further evaluation of seizure-like activity versus syncope. These episodes have been recurrent since September 2021, but patient experienced 2 episodes within a few hours tonight. No focal deficits noted on exam. Vital stable. She is afebrile. No metabolic derangements, UTI. Patient to be seen by neurology in consultation; anticipate inpatient EEG and MRI. Dr. Hal Hope of [facility] to admit.   Final Clinical Impression(s) / ED Diagnoses Final diagnoses:  Seizure-like activity [facility])    Rx / [place] Orders ED Discharge Orders    None       Antonietta Breach, PA-C 08/02/20 0344    Gareth [name], MD 08/03/20 2209

## 2020-08-02 NOTE — ED Notes (Signed)
Breakfast ordered 

## 2020-08-02 NOTE — H&P (Signed)
History and Physical    Melinda Griffin PQZ:[id number] DOB: [birth year] DOA: 08/02/2020  PCP: Melinda Carol, MD  Patient coming from: Home.  Chief Complaint: Loss of consciousness and shaking spell.  History obtained from patient's daughter.  Patient has dementia.  HPI: Melinda Griffin is a 79 y.o. female with dementia, hyperlipidemia, [name].  CT head C-spine was unremarkable and patient was taken home by patient's daughter.  Patient's daughter states that after increasing home patient was given a bath and while drying her understanding.  Patient had a staring spell and she was saying she did not feel well and her daughter made her sit on the commode following which she became limp and her all 4 extremities started shaking.  Lasted for about half a minute.  After which patient started me to lay on the floor.  EMS was called.  After EMS arrived in 20 minutes patient started having this shaking spells again and patient appeared confused but no incontinence of urine.  But during prior episodes patient did have incontinence of urine.  While EMS was in the house patient had these shaking spells he lasted for less than half a minute at least 4 episodes within 10 minutes with intervals of 2 minutes in between.  ED Course: In the ER patient is afebrile mildly lethargic CT head and C-spine done earlier was unremarkable.  EKG shows normal sinus rhythm with QTC of 452 ms.  Labs are significant leukocytosis.  2D echo done last week showed EF of 60 to 65%.  Grade 1 diastolic dysfunction.  On-call neurologist was consulted admitted for possible seizure-like activity.  Covid test was negative.  AST and ALT are mildly elevated.  Review of Systems: As per HPI, rest all negative.   Past Medical History:  Diagnosis Date  .  Allergic rhinitis   . Anxiety   . Asthma   . Hyperlipidemia   . Migraine with aura, without mention of intractable migraine without mention of status migrainosus     Past Surgical History:  Procedure Laterality Date  . APPENDECTOMY  1975  . BREAST BIOPSY Left 1976  . TUBAL LIGATION Bilateral 1976     reports that she has never smoked. She has never used smokeless tobacco. She reports that she does not drink alcohol and does not use drugs.  Allergies  Allergen Reactions  . 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 Aspirin     Advised by MD previously not to take  . Ceftin     Dizzy & Diarrhea  . Ciprofloxacin Hcl     Dizzy, diarrhea  . Ativan [Lorazepam] Anxiety    Agitation - tolerates Zyprexa  . Haldol [Haloperidol] Anxiety    Agitation - tolerates Zyprexa    Family History  Problem Relation Age of Onset  . Asthma Father   . Asthma Sister   . Heart disease Sister   . Cancer Mother        brain, breast  . Cancer Brother 21  . Heart disease Brother   . Hypertension Brother     Prior to Admission medications   Medication Sig Start Date End Date Taking? Authorizing Provider  albuterol (PROVENTIL) (2.5 MG/3ML) 0.083% nebulizer solution Take 3 mLs (2.5 mg total) by nebulization every 6 (six) hours as needed for wheezing or shortness of breath. 07/27/20   Melinda Napoleon,  Ree Shay, NP  albuterol (VENTOLIN HFA) 108 (90 Base) MCG/ACT inhaler Inhale 1-2 puffs into the lungs every 6 (six) hours as needed for wheezing or shortness of breath.  02/21/20   [provider]  budesonide (PULMICORT) 0.25 MG/2ML nebulizer solution Take 2 mLs (0.25 mg total) by nebulization 2 (two) times daily. Partial refill ok 07/25/20   Martyn Ehrich, NP  Calcium Carb-Cholecalciferol (CALCIUM 500 + D) 500-200 MG-UNIT TABS Take 1 tablet by mouth daily.    [provider]  cetirizine (ZYRTEC) 10 MG tablet Take 10 mg by mouth daily. Take one tablet once daily for allergies     [provider]  citalopram (CELEXA) 40 MG tablet TAKE 1 TABLET BY MOUTH ONCE DAILY Patient taking differently: Take 40 mg by mouth daily.  05/04/18   Reed, Tiffany L, DO  lovastatin (MEVACOR) 40 MG tablet TAKE 1 TABLET BY MOUTH EVERY DAY TO LOWER CHOLESTEROL Patient taking differently: Take 40 mg by mouth every evening.  07/03/18   Reed, Tiffany L, DO  Melatonin 10 MG TABS Take 10 mg by mouth at bedtime as needed (sleep).    [provider]  mirtazapine (REMERON) 15 MG tablet Take 15 mg by mouth every evening.  02/24/20   [provider]  montelukast (SINGULAIR) 10 MG tablet TAKE 1 TABLET BY MOUTH EVERY DAY Patient taking differently: Take 10 mg by mouth every evening.  12/01/18   Reed, Tiffany L, DO  omeprazole (PRILOSEC) 10 MG capsule Take 10 mg by mouth in the morning and at bedtime.    [provider]    Physical Exam: Constitutional: Moderately built and nourished. Vitals:   08/02/20 0300 08/02/20 0330 08/02/20 0445 08/02/20 0530  BP: (!) 106/53 (!) 103/55 (!) 97/56 (!) 112/55  Pulse: 86 81 76 70  Resp: (!) 22 18 18 16   Temp:      TempSrc:      SpO2: 95% 94% 94% 96%   Eyes: Anicteric no pallor. ENMT: No discharge from the ears eyes nose or mouth. Neck: No mass felt.  No neck rigidity. Respiratory: No rhonchi or crepitations. Cardiovascular: S1-S2 heard. Abdomen: Soft nontender bowel sounds present. Musculoskeletal: No edema. Skin: No rash. Neurologic: Patient is mildly lethargic but arousable has not been wearing her hearing aid so does not follow commands well but moving all extremities.  Pupils reacting to light. Psychiatric: Appears manic confused but patient not wearing a hearing aid.   Labs on Admission: I have personally reviewed following labs and imaging studies  CBC: Recent Labs  Lab 07/26/20 1540 07/26/20 2200 07/27/20 0614 07/28/20 0607 07/29/20 0723 07/30/20 0634 08/02/20 0015  WBC 9.9   < > 11.5* 15.5* 10.6* 8.3 14.5*  NEUTROABS  5.5  --   --  11.4* 6.5 4.6 11.7*  HGB 13.5   < > 12.1 12.4 11.9* 12.0 12.9  HCT 39.0   < > 35.9* 37.2 35.5* 35.9* 38.1  MCV 90.3   < > 92.5 92.3 91.0 91.8 91.6  PLT 334   < > 295 305 304 346 399   < > = values in this interval not displayed.   Basic Metabolic Panel: Recent Labs  Lab 07/27/20 0614 07/28/20 0607 07/29/20 0723 07/30/20 0634 08/02/20 0015  NA 139 137 138 140 138  K 3.5 4.0 3.6 3.7 3.7  CL 106 106 103 107 103  CO2 21* 21* 23 24 21*  GLUCOSE 98 106* 114* 101* 147*  BUN 14 <5* 7* 7* 19  CREATININE 0.66 0.67 0.57 0.64 0.99  CALCIUM 8.4* 8.2* 8.3* 8.6* 9.1  MG 1.7 1.9 2.0 1.9 1.7  PHOS 2.6 1.8* 2.4* 2.5  --    GFR: Estimated Creatinine Clearance: 33.6 mL/min (by C-G formula based on SCr of 0.99 mg/dL). Liver Function Tests: Recent Labs  Lab 07/26/20 1540 07/26/20 1540 07/27/20 0614 07/28/20 0607 07/29/20 0723 07/30/20 0634 08/02/20 0015  AST 18  --  20 19 18   --  130*  ALT 16  --  16 16 15   --  63*  ALKPHOS 69  --  55 60 64  --  61  BILITOT 0.7  --  0.5 0.5 0.3  --  0.6  PROT 8.1  --  6.7 7.0 7.0  --  7.6  ALBUMIN 3.5   < > 3.0* 3.0* 2.9* 3.0* 3.2*   < > = values in this interval not displayed.   No results for input(s): LIPASE, AMYLASE in the last 168 hours. No results for input(s): AMMONIA in the last 168 hours. Coagulation Profile: No results for input(s): INR, PROTIME in the last 168 hours. Cardiac Enzymes: No results for input(s): CKTOTAL, CKMB, CKMBINDEX, TROPONINI in the last 168 hours. BNP (last 3 results) No results for input(s): PROBNP in the last 8760 hours. HbA1C: No results for input(s): HGBA1C in the last 72 hours. CBG: Recent Labs  Lab 07/28/20 0749 07/29/20 0745 07/31/20 0612 07/31/20 0821 08/02/20 0041  GLUCAP 107* 117* 98 108* 146*   Lipid Profile: No results for input(s): CHOL, HDL, LDLCALC, TRIG, CHOLHDL, LDLDIRECT in the last 72 hours. Thyroid Function Tests: No results for input(s): TSH, T4TOTAL, FREET4, T3FREE,  THYROIDAB in the last 72 hours. Anemia Panel: No results for input(s): VITAMINB12, FOLATE, FERRITIN, TIBC, IRON, RETICCTPCT in the last 72 hours. Urine analysis:    Component Value Date/Time   COLORURINE AMBER (A) 08/02/2020 0252   APPEARANCEUR HAZY (A) 08/02/2020 0252   LABSPEC 1.028 08/02/2020 0252   PHURINE 5.0 08/02/2020 0252   GLUCOSEU NEGATIVE 08/02/2020 0252   HGBUR NEGATIVE 08/02/2020 0252   BILIRUBINUR MODERATE (A) 08/02/2020 0252   KETONESUR 20 (A) 08/02/2020 0252   PROTEINUR 30 (A) 08/02/2020 0252   NITRITE NEGATIVE 08/02/2020 0252   LEUKOCYTESUR TRACE (A) 08/02/2020 0252   Sepsis Labs: @LABRCNTIP (procalcitonin:4,lacticidven:4) ) Recent Results (from the past 240 hour(s))  Resp Panel by RT-PCR (Flu A&B, Covid) Nasopharyngeal Swab     Status: None   Collection Time: 07/26/20  3:48 PM   Specimen: Nasopharyngeal Swab; Nasopharyngeal(NP) swabs in vial transport medium  Result Value Ref Range Status   SARS Coronavirus 2 by RT PCR NEGATIVE NEGATIVE Final    Comment: (NOTE) SARS-CoV-2 target nucleic acids are NOT DETECTED.  The SARS-CoV-2 RNA is generally detectable in upper respiratory specimens during the acute phase of infection. The lowest concentration of SARS-CoV-2 viral copies this assay can detect is 138 copies/mL. A negative result does not preclude SARS-Cov-2 infection and should not be used as the sole basis for treatment or other patient management decisions. A negative result may occur with  improper specimen collection/handling, submission of specimen other than nasopharyngeal swab, presence of viral mutation(s) within the areas targeted by this assay, and inadequate number of viral copies(<138 copies/mL). A negative result must be combined with clinical observations, patient history, and epidemiological information. The expected result is Negative.  Fact Sheet for Patients:  EntrepreneurPulse.com.au  Fact Sheet for Healthcare  Providers:  IncredibleEmployment.be  This test is no t yet approved or cleared by  the [employer] and  has been authorized for detection and/or diagnosis of SARS-CoV-2 by FDA under an Emergency Use Authorization (EUA). This EUA will remain  in effect (meaning this test can be used) for the duration of the COVID-19 declaration under Section 564(b)(1) of the Act, 21 U.S.C.section 360bbb-3(b)(1), unless the authorization is terminated  or revoked sooner.       Influenza A by PCR NEGATIVE NEGATIVE Final   Influenza B by PCR NEGATIVE NEGATIVE Final    Comment: (NOTE) The Xpert Xpress SARS-CoV-2/FLU/RSV plus assay is intended as an aid in the diagnosis of influenza from Nasopharyngeal swab specimens and should not be used as a sole basis for treatment. Nasal washings and aspirates are unacceptable for Xpert Xpress SARS-CoV-2/FLU/RSV testing.  Fact Sheet for Patients: EntrepreneurPulse.com.au  Fact Sheet for Healthcare Providers: IncredibleEmployment.be  This test is not yet approved or cleared by the [place] FDA and has been authorized for detection and/or diagnosis of SARS-CoV-2 by FDA under an Emergency Use Authorization (EUA). This EUA will remain in effect (meaning this test can be used) for the duration of the COVID-19 declaration under Section 564(b)(1) of the Act, 21 U.S.C. section 360bbb-3(b)(1), unless the authorization is terminated or revoked.  Performed at [facility], [place] [address], [place], [place] [address]   Urine Culture     Status: Abnormal   Collection Time: 07/26/20  5:05 PM   Specimen: Urine, Random  Result Value Ref Range Status   Specimen Description   Final    URINE, RANDOM Performed at [facility] [address], [place], [place] [address]    Special Requests   Final    NONE Performed at [facility], [place] [address]  [place], [place] [address]    Culture MULTIPLE SPECIES PRESENT, SUGGEST RECOLLECTION (A)  Final   Report Status 07/28/2020 FINAL  Final  Blood culture (routine x 2)     Status: None   Collection Time: 07/26/20  5:12 PM   Specimen: BLOOD  Result Value Ref Range Status   Specimen Description   Final    BLOOD LEFT ANTECUBITAL Performed at [facility] [address], [place], [place] [address]    Special Requests   Final    BOTTLES DRAWN AEROBIC AND ANAEROBIC Blood Culture adequate volume Performed at [facility] [address], [place], [place] [address]    Culture   Final    NO GROWTH 5 DAYS Performed at [facility], [place] [address], [place], [place] [address]    Report Status 07/31/2020 FINAL  Final  Blood culture (routine x 2)     Status: None   Collection Time: 07/26/20  5:17 PM   Specimen: BLOOD LEFT FOREARM  Result Value Ref Range Status   Specimen Description   Final    BLOOD LEFT FOREARM Performed at [facility] [address], [place], [place] [address]    Special Requests   Final    BOTTLES DRAWN AEROBIC AND ANAEROBIC Blood Culture results may not be optimal due to an inadequate volume of blood received in culture bottles   Culture   Final    NO GROWTH 5 DAYS Performed at [facility], [place] [address], [place], [place] [address]    Report Status 07/31/2020 FINAL  Final  Respiratory Panel by PCR     Status: None   Collection Time: 07/26/20  6:01 PM   Specimen: Nasopharyngeal  Swab; Respiratory  Result Value Ref Range Status   Adenovirus NOT DETECTED NOT DETECTED Final   Coronavirus 229E NOT DETECTED NOT DETECTED Final    Comment: (NOTE) The Coronavirus on the Respiratory Panel, DOES NOT test for the novel  Coronavirus (2019 nCoV)    Coronavirus HKU1 NOT DETECTED NOT DETECTED Final   Coronavirus NL63 NOT DETECTED NOT DETECTED Final   Coronavirus OC43 NOT DETECTED NOT DETECTED Final    Metapneumovirus NOT DETECTED NOT DETECTED Final   Rhinovirus / Enterovirus NOT DETECTED NOT DETECTED Final   Influenza A NOT DETECTED NOT DETECTED Final   Influenza B NOT DETECTED NOT DETECTED Final   Parainfluenza Virus 1 NOT DETECTED NOT DETECTED Final   Parainfluenza Virus 2 NOT DETECTED NOT DETECTED Final   Parainfluenza Virus 3 NOT DETECTED NOT DETECTED Final   Parainfluenza Virus 4 NOT DETECTED NOT DETECTED Final   Respiratory Syncytial Virus NOT DETECTED NOT DETECTED Final   Bordetella pertussis NOT DETECTED NOT DETECTED Final   Chlamydophila pneumoniae NOT DETECTED NOT DETECTED Final   Mycoplasma pneumoniae NOT DETECTED NOT DETECTED Final    Comment: Performed at [facility], [place] [address] [place], [place] [address]  MRSA PCR Screening     Status: None   Collection Time: 07/28/20  5:20 PM   Specimen: Nasal Mucosa; Nasopharyngeal  Result Value Ref Range Status   MRSA by PCR NEGATIVE NEGATIVE Final    Comment:        The GeneXpert MRSA Assay (FDA approved for NASAL specimens only), is one component of a comprehensive MRSA colonization surveillance program. It is not intended to diagnose MRSA infection nor to guide or monitor treatment for MRSA infections. Performed at [facility], [place] [address] [place], [place] [address]   Resp Panel by RT-PCR (Flu A&B, Covid) Nasopharyngeal Swab     Status: None   Collection Time: 08/02/20  3:52 AM   Specimen: Nasopharyngeal Swab; Nasopharyngeal(NP) swabs in vial transport medium  Result Value Ref Range Status   SARS Coronavirus 2 by RT PCR NEGATIVE NEGATIVE Final    Comment: (NOTE) SARS-CoV-2 target nucleic acids are NOT DETECTED.  The SARS-CoV-2 RNA is generally detectable in upper respiratory specimens during the acute phase of infection. The lowest concentration of SARS-CoV-2 viral copies this assay can detect is 138 copies/mL. A negative result does not preclude SARS-Cov-2 infection and should not  be used as the sole basis for treatment or other patient management decisions. A negative result may occur with  improper specimen collection/handling, submission of specimen other than nasopharyngeal swab, presence of viral mutation(s) within the areas targeted by this assay, and inadequate number of viral copies(<138 copies/mL). A negative result must be combined with clinical observations, patient history, and epidemiological information. The expected result is Negative.  Fact Sheet for Patients:  EntrepreneurPulse.com.au  Fact Sheet for Healthcare Providers:  IncredibleEmployment.be  This test is no t yet approved or cleared by the [place] FDA and  has been authorized for detection and/or diagnosis of SARS-CoV-2 by FDA under an Emergency Use Authorization (EUA). This EUA will remain  in effect (meaning this test can be used) for the duration of the COVID-19 declaration under Section 564(b)(1) of the Act, 21 U.S.C.section 360bbb-3(b)(1), unless the authorization is terminated  or revoked sooner.       Influenza A by PCR NEGATIVE NEGATIVE Final   Influenza B by PCR NEGATIVE NEGATIVE Final    Comment: (NOTE) The Xpert Xpress SARS-CoV-2/FLU/RSV plus assay is  intended as an aid in the diagnosis of influenza from Nasopharyngeal swab specimens and should not be used as a sole basis for treatment. Nasal washings and aspirates are unacceptable for Xpert Xpress SARS-CoV-2/FLU/RSV testing.  Fact Sheet for Patients: EntrepreneurPulse.com.au  Fact Sheet for Healthcare Providers: IncredibleEmployment.be  This test is not yet approved or cleared by the [place] FDA and has been authorized for detection and/or diagnosis of SARS-CoV-2 by FDA under an Emergency Use Authorization (EUA). This EUA will remain in effect (meaning this test can be used) for the duration of the COVID-19 declaration under Section  564(b)(1) of the Act, 21 U.S.C. section 360bbb-3(b)(1), unless the authorization is terminated or revoked.  Performed at [facility], [place] [address], [place], [place] [address]      Radiological Exams on Admission: CT HEAD WO CONTRAST  Result Date: 08/01/2020 CLINICAL DATA:  Fall EXAM: CT HEAD WITHOUT CONTRAST TECHNIQUE: Contiguous axial images were obtained from the base of the skull through the vertex without intravenous contrast. COMPARISON:  07/04/2020 FINDINGS: Brain: There is no acute intracranial hemorrhage, mass effect, or edema. Gray-white differentiation is preserved. There is no extra-axial fluid collection. Patchy and confluent areas of hypoattenuation in the supratentorial white matter are nonspecific a probably reflects stable chronic microvascular ischemic changes. Prominence of the ventricles and sulci reflects stable parenchymal volume loss. Vascular: There is mild atherosclerotic calcification at the skull base. Skull: Calvarium is unremarkable. Sinuses/Orbits: Layering secretions in the left sphenoid and maxillary sinuses. Evidence of chronic sinus inflammation. Other: Right posterior scalp soft tissue swelling IMPRESSION: No evidence of acute intracranial injury. Electronically Signed   By: Macy Mis M.D.   On: 08/01/2020 16:40   CT CERVICAL SPINE WO CONTRAST  Result Date: 08/01/2020 CLINICAL DATA:  Fall EXAM: CT CERVICAL SPINE WITHOUT CONTRAST TECHNIQUE: Multidetector CT imaging of the cervical spine was performed without intravenous contrast. Multiplanar CT image reconstructions were also generated. COMPARISON:  March 2021 FINDINGS: Alignment: Anteroposterior alignment is maintained. Skull base and vertebrae: Stable vertebral body heights. No acute cervical spine fracture. Soft tissues and spinal canal: No prevertebral fluid or swelling. No visible canal hematoma. Disc levels: Multilevel degenerative changes are stable in appearance. Upper chest: No apical lung mass.  Other: Calcified plaque at the common carotid bifurcations. IMPRESSION: No acute cervical spine fracture. Electronically Signed   By: Macy Mis M.D.   On: 08/01/2020 16:43    EKG: Independently reviewed.  Normal sinus rhythm.  Assessment/Plan Principal Problem:   Syncope Active Problems:   Hyperlipidemia    1. Seizure-like activity with history of recurrent syncope presently witnessed by EMS to have generalized tonic-clonic activity concerning for seizure.  Patient's daughter states that usually she had 1 or 2 episode but this time she had 4 back-to-back episode.  Discussed with neurologist recommended to get EEG MRI brain presently holding of antiepileptics.  Further commendations per neurology.  2D echo done recently last week showed an EF of 60 to 65% with grade 2 diastolic dysfunction.  Will monitor in telemetry.  Per daughter patient does not do well with Ativan. 2. History of dementia presently not on any medication patient daughter said that she does not do well with Ativan or Haldol but Zyprexa is okay for agitation.  Takes mirtazapine and citalopram. 3. Hyperlipidemia on statins. 4. History of asthma on singular as needed albuterol also takes Zyrtec. 5. Leukocytosis could be reactionary no signs of infection at this time we will closely monitor. 6. Elevated LFTs cause not clear  abdomen appears benign continue to monitor.  Follow LFTs.   DVT prophylaxis: Lovenox. Code Status: DNR confirmed with patient's daughter. Family Communication: Patient's daughter. Disposition Plan: To be determined. Consults called: Neurologist. Admission status: Observation.   Rise Patience MD [facility] Pager [phone].  If 7PM-7AM, please contact night-coverage [email or web address] Password [facility]  08/02/2020, 6:05 AM

## 2020-08-02 NOTE — Progress Notes (Signed)
Manufacturing engineer [facility]) Community Based Palliative Care       This patient has been referred to our palliative care services in the community.  [facility] will continue to follow for any discharge planning needs and to coordinate admission onto palliative care.   If you have questions or need assistance, please call [phone] or contact the hospital Liaison listed on AMION.     Thank you for the opportunity to participate in this patient's care.     Domenic Moras, BSN, RN [facility] Liaison   [phone]

## 2020-08-02 NOTE — ED Notes (Signed)
Attempted to call report

## 2020-08-02 NOTE — Progress Notes (Signed)
PROGRESS NOTE    Melinda Griffin  ZJQ:[id number] DOB: February 17, 1941 DOA: 08/02/2020 PCP: Seward Carol, MD   Brief Narrative:  HPI on 08/02/2020 by Dr. Gean Birchwood Melinda Griffin is a 79 y.o. female with dementia, hyperlipidemia, [name].  CT head C-spine was unremarkable and patient was taken home by patient's daughter.  Patient's daughter states that after increasing home patient was given a bath and while drying her understanding.  Patient had a staring spell and she was saying she did not feel well and her daughter made her sit on the commode following which she became limp and her all 4 extremities started shaking.  Lasted for about half a minute.  After which patient started me to lay on the floor.  EMS was called.  After EMS arrived in 20 minutes patient started having this shaking spells again and patient appeared confused but no incontinence of urine.  But during prior episodes patient did have incontinence of urine.  While EMS was in the house patient had these shaking spells he lasted for less than half a minute at least 4 episodes within 10 minutes with intervals of 2 minutes in between. Assessment & Plan   Seizure-like activity -history of recurrent syncope -Patient witnessed by EMS to have a generalized tonic-clonic activity -Per daughter, patient had 4 episodes -Neurology consulted and appreciated, recommended EEG as well as MRI -Echocardiogram done 1 week ago showed an EF of 60 to 24%, grade 2 diastolic dysfunction -Per daughter, patient does not do well with Ativan -CT head showed no evidence of acute intercranial injury MRI brain showed no acute abnormality.  Progression of atrophy and chronic microvascular ischemic change and white matter since 2019 MRI -EEG suggestive of moderate diffuse  encephalopathy, nonspecific etiology.  No seizures or left form discharges were seen throughout the recording -Continue Keppra  Dementia -Not presently on any medications -Per daughter, patient has not developed Ativan or Haldol but is okay with Zyprexa for agitation  Depression/anxiety -Continue mirtazapine, citalopram  Hyperlipidemia -Hold statin due to elevated LFTs  Asthma -Continue Singulair, albuterol as needed  Leukocytosis -Possibly reactive.  Patient has no signs of infection at this time -Trending downward  Elevated LFTs -AST 130, ALT 63 -We will continue to monitor  DVT Prophylaxis  lovenox  Code Status: DNR  Family Communication: None at bedside  Disposition Plan:  Status is: Observation  The patient remains OBS appropriate and will d/c before 2 midnights.  Dispo: The patient is from: Home              Anticipated d/c is to: Home              Anticipated d/c date is: 1 day              Patient currently is not medically stable to d/c.   Consultants Neuorology  Procedures  EEG  Antibiotics   Anti-infectives (From admission, onward)   None      Subjective:   Melinda Griffin seen and examined today. Not interactive this morning.   Objective:   Vitals:   08/02/20 0845 08/02/20 0900 08/02/20 0915 08/02/20 1200  BP:   (!) 116/55 (!) 115/53  Pulse: 81 72 82   Resp: 16 19 14 17   Temp:    97.7 F (36.5 C)  TempSrc:    Oral  SpO2: 94% 94% 94% 95%   No intake or output data in the 24 hours ending 08/02/20 1256 There were no vitals filed for this visit.  Exam  General: Well developed, elderly, chronically ill appearing, NAD  HEENT: NCAT, mucous membranes moist.   Cardiovascular: S1 S2 auscultated, RRR  Respiratory: Clear to auscultation bilaterally with equal chest rise  Abdomen: Soft, nontender, nondistended, + bowel sounds  Extremities: warm dry without cyanosis clubbing or edema  Neuro: Awake and alert, hard of hearing. Confused  (dementia at baseline)   Data Reviewed: I have personally reviewed following labs and imaging studies  CBC: Recent Labs  Lab 07/26/20 1540 07/26/20 2200 07/28/20 0607 07/29/20 0723 07/30/20 0634 08/02/20 0015 08/02/20 0616  WBC 9.9   < > 15.5* 10.6* 8.3 14.5* 12.2*  NEUTROABS 5.5  --  11.4* 6.5 4.6 11.7*  --   HGB 13.5   < > 12.4 11.9* 12.0 12.9 12.2  HCT 39.0   < > 37.2 35.5* 35.9* 38.1 36.0  MCV 90.3   < > 92.3 91.0 91.8 91.6 91.6  PLT 334   < > 305 304 346 399 392   < > = values in this interval not displayed.   Basic Metabolic Panel: Recent Labs  Lab 07/27/20 0614 07/27/20 0614 07/28/20 0607 07/29/20 0723 07/30/20 0634 08/02/20 0015 08/02/20 0616  NA 139  --  137 138 140 138  --   K 3.5  --  4.0 3.6 3.7 3.7  --   CL 106  --  106 103 107 103  --   CO2 21*  --  21* 23 24 21*  --   GLUCOSE 98  --  106* 114* 101* 147*  --   BUN 14  --  <5* 7* 7* 19  --   CREATININE 0.66   < > 0.67 0.57 0.64 0.99 0.82  CALCIUM 8.4*  --  8.2* 8.3* 8.6* 9.1  --   MG 1.7  --  1.9 2.0 1.9 1.7  --   PHOS 2.6  --  1.8* 2.4* 2.5  --   --    < > = values in this interval not displayed.   GFR: Estimated Creatinine Clearance: 40.6 mL/min (by C-G formula based on SCr of 0.82 mg/dL). Liver Function Tests: Recent Labs  Lab 07/26/20 1540 07/26/20 1540 07/27/20 0614 07/28/20 0607 07/29/20 0723 07/30/20 0634 08/02/20 0015  AST 18  --  20 19 18   --  130*  ALT 16  --  16 16 15   --  63*  ALKPHOS 69  --  55 60 64  --  61  BILITOT 0.7  --  0.5 0.5 0.3  --  0.6  PROT 8.1  --  6.7 7.0 7.0  --  7.6  ALBUMIN 3.5   < > 3.0* 3.0* 2.9* 3.0* 3.2*   < > = values in this interval not displayed.   No results for input(s): LIPASE, AMYLASE in the last 168 hours. No results for input(s): AMMONIA in the last 168 hours. Coagulation Profile: No results for input(s): INR, PROTIME in the last 168 hours. Cardiac Enzymes: No results for input(s): CKTOTAL, CKMB, CKMBINDEX, TROPONINI in the last 168  hours. BNP (last 3 results) No results for input(s): PROBNP in the last 8760 hours. HbA1C: No results for input(s): HGBA1C in the last 72 hours. CBG: Recent Labs  Lab 07/28/20 0749 07/29/20 0745 07/31/20 0612 07/31/20 0821 08/02/20 0041  GLUCAP 107* 117* 98 108* 146*  Lipid Profile: No results for input(s): CHOL, HDL, LDLCALC, TRIG, CHOLHDL, LDLDIRECT in the last 72 hours. Thyroid Function Tests: No results for input(s): TSH, T4TOTAL, FREET4, T3FREE, THYROIDAB in the last 72 hours. Anemia Panel: No results for input(s): VITAMINB12, FOLATE, FERRITIN, TIBC, IRON, RETICCTPCT in the last 72 hours. Urine analysis:    Component Value Date/Time   COLORURINE AMBER (A) 08/02/2020 0252   APPEARANCEUR HAZY (A) 08/02/2020 0252   LABSPEC 1.028 08/02/2020 0252   PHURINE 5.0 08/02/2020 0252   GLUCOSEU NEGATIVE 08/02/2020 0252   HGBUR NEGATIVE 08/02/2020 0252   BILIRUBINUR MODERATE (A) 08/02/2020 0252   KETONESUR 20 (A) 08/02/2020 0252   PROTEINUR 30 (A) 08/02/2020 0252   NITRITE NEGATIVE 08/02/2020 0252   LEUKOCYTESUR TRACE (A) 08/02/2020 0252   Sepsis Labs: @LABRCNTIP (procalcitonin:4,lacticidven:4)  ) Recent Results (from the past 240 hour(s))  Resp Panel by RT-PCR (Flu A&B, Covid) Nasopharyngeal Swab     Status: None   Collection Time: 07/26/20  3:48 PM   Specimen: Nasopharyngeal Swab; Nasopharyngeal(NP) swabs in vial transport medium  Result Value Ref Range Status   SARS Coronavirus 2 by RT PCR NEGATIVE NEGATIVE Final    Comment: (NOTE) SARS-CoV-2 target nucleic acids are NOT DETECTED.  The SARS-CoV-2 RNA is generally detectable in upper respiratory specimens during the acute phase of infection. The lowest concentration of SARS-CoV-2 viral copies this assay can detect is 138 copies/mL. A negative result does not preclude SARS-Cov-2 infection and should not be used as the sole basis for treatment or other patient management decisions. A negative result may occur with   improper specimen collection/handling, submission of specimen other than nasopharyngeal swab, presence of viral mutation(s) within the areas targeted by this assay, and inadequate number of viral copies(<138 copies/mL). A negative result must be combined with clinical observations, patient history, and epidemiological information. The expected result is Negative.  Fact Sheet for Patients:  EntrepreneurPulse.com.au  Fact Sheet for Healthcare Providers:  IncredibleEmployment.be  This test is no t yet approved or cleared by the [place] FDA and  has been authorized for detection and/or diagnosis of SARS-CoV-2 by FDA under an Emergency Use Authorization (EUA). This EUA will remain  in effect (meaning this test can be used) for the duration of the COVID-19 declaration under Section 564(b)(1) of the Act, 21 U.S.C.section 360bbb-3(b)(1), unless the authorization is terminated  or revoked sooner.       Influenza A by PCR NEGATIVE NEGATIVE Final   Influenza B by PCR NEGATIVE NEGATIVE Final    Comment: (NOTE) The Xpert Xpress SARS-CoV-2/FLU/RSV plus assay is intended as an aid in the diagnosis of influenza from Nasopharyngeal swab specimens and should not be used as a sole basis for treatment. Nasal washings and aspirates are unacceptable for Xpert Xpress SARS-CoV-2/FLU/RSV testing.  Fact Sheet for Patients: EntrepreneurPulse.com.au  Fact Sheet for Healthcare Providers: IncredibleEmployment.be  This test is not yet approved or cleared by the [place] FDA and has been authorized for detection and/or diagnosis of SARS-CoV-2 by FDA under an Emergency Use Authorization (EUA). This EUA will remain in effect (meaning this test can be used) for the duration of the COVID-19 declaration under Section 564(b)(1) of the Act, 21 U.S.C. section 360bbb-3(b)(1), unless the authorization is terminated  or revoked.  Performed at [facility], [place] [address], [place], [place] [address]   Urine Culture     Status: Abnormal   Collection Time: 07/26/20  5:05 PM   Specimen: Urine, Random  Result Value Ref Range Status  Specimen Description   Final    URINE, RANDOM Performed at [facility], [place] [address], [place], [place] [address]    Special Requests   Final    NONE Performed at [facility], [place] [address], [place], [place] [address]    Culture MULTIPLE SPECIES PRESENT, SUGGEST RECOLLECTION (A)  Final   Report Status 07/28/2020 FINAL  Final  Blood culture (routine x 2)     Status: None   Collection Time: 07/26/20  5:12 PM   Specimen: BLOOD  Result Value Ref Range Status   Specimen Description   Final    BLOOD LEFT ANTECUBITAL Performed at [place] [address], [place], [place] [address]    Special Requests   Final    BOTTLES DRAWN AEROBIC AND ANAEROBIC Blood Culture adequate volume Performed at [place] [address] [place] [address] [place], [place] [address]    Culture   Final    NO GROWTH 5 DAYS Performed at [facility], [place] [address] [place], [place] [address]    Report Status 07/31/2020 FINAL  Final  Blood culture (routine x 2)     Status: None   Collection Time: 07/26/20  5:17 PM   Specimen: BLOOD LEFT FOREARM  Result Value Ref Range Status   Specimen Description   Final    BLOOD LEFT FOREARM Performed at [place] [address] [place], [place] [address]    Special Requests   Final    BOTTLES DRAWN AEROBIC AND ANAEROBIC Blood Culture results may not be optimal due to an inadequate volume of blood received in culture bottles   Culture   Final    NO GROWTH 5 DAYS Performed at [facility], [place] [address], [place], [place] [address]    Report Status 07/31/2020 FINAL  Final  Respiratory Panel by PCR     Status: None   Collection  Time: 07/26/20  6:01 PM   Specimen: Nasopharyngeal Swab; Respiratory  Result Value Ref Range Status   Adenovirus NOT DETECTED NOT DETECTED Final   Coronavirus 229E NOT DETECTED NOT DETECTED Final    Comment: (NOTE) The Coronavirus on the Respiratory Panel, DOES NOT test for the novel  Coronavirus (2019 nCoV)    Coronavirus HKU1 NOT DETECTED NOT DETECTED Final   Coronavirus NL63 NOT DETECTED NOT DETECTED Final   Coronavirus OC43 NOT DETECTED NOT DETECTED Final   Metapneumovirus NOT DETECTED NOT DETECTED Final   Rhinovirus / Enterovirus NOT DETECTED NOT DETECTED Final   Influenza A NOT DETECTED NOT DETECTED Final   Influenza B NOT DETECTED NOT DETECTED Final   Parainfluenza Virus 1 NOT DETECTED NOT DETECTED Final   Parainfluenza Virus 2 NOT DETECTED NOT DETECTED Final   Parainfluenza Virus 3 NOT DETECTED NOT DETECTED Final   Parainfluenza Virus 4 NOT DETECTED NOT DETECTED Final   Respiratory Syncytial Virus NOT DETECTED NOT DETECTED Final   Bordetella pertussis NOT DETECTED NOT DETECTED Final   Chlamydophila pneumoniae NOT DETECTED NOT DETECTED Final   Mycoplasma pneumoniae NOT DETECTED NOT DETECTED Final    Comment: Performed at [facility], [place] [address] [place], [place] [address]  MRSA PCR Screening     Status: None   Collection Time: 07/28/20  5:20 PM   Specimen: Nasal Mucosa; Nasopharyngeal  Result Value Ref Range Status   MRSA by PCR NEGATIVE NEGATIVE Final    Comment:        The GeneXpert MRSA  Assay (FDA approved for NASAL specimens only), is one component of a comprehensive MRSA colonization surveillance program. It is not intended to diagnose MRSA infection nor to guide or monitor treatment for MRSA infections. Performed at [facility], [place] [address], [place], [place] [address]   Resp Panel by RT-PCR (Flu A&B, Covid) Nasopharyngeal Swab     Status: None   Collection Time: 08/02/20  3:52 AM   Specimen: Nasopharyngeal Swab;  Nasopharyngeal(NP) swabs in vial transport medium  Result Value Ref Range Status   SARS Coronavirus 2 by RT PCR NEGATIVE NEGATIVE Final    Comment: (NOTE) SARS-CoV-2 target nucleic acids are NOT DETECTED.  The SARS-CoV-2 RNA is generally detectable in upper respiratory specimens during the acute phase of infection. The lowest concentration of SARS-CoV-2 viral copies this assay can detect is 138 copies/mL. A negative result does not preclude SARS-Cov-2 infection and should not be used as the sole basis for treatment or other patient management decisions. A negative result may occur with  improper specimen collection/handling, submission of specimen other than nasopharyngeal swab, presence of viral mutation(s) within the areas targeted by this assay, and inadequate number of viral copies(<138 copies/mL). A negative result must be combined with clinical observations, patient history, and epidemiological information. The expected result is Negative.  Fact Sheet for Patients:  EntrepreneurPulse.com.au  Fact Sheet for Healthcare Providers:  IncredibleEmployment.be  This test is no t yet approved or cleared by the [place] FDA and  has been authorized for detection and/or diagnosis of SARS-CoV-2 by FDA under an Emergency Use Authorization (EUA). This EUA will remain  in effect (meaning this test can be used) for the duration of the COVID-19 declaration under Section 564(b)(1) of the Act, 21 U.S.C.section 360bbb-3(b)(1), unless the authorization is terminated  or revoked sooner.       Influenza A by PCR NEGATIVE NEGATIVE Final   Influenza B by PCR NEGATIVE NEGATIVE Final    Comment: (NOTE) The Xpert Xpress SARS-CoV-2/FLU/RSV plus assay is intended as an aid in the diagnosis of influenza from Nasopharyngeal swab specimens and should not be used as a sole basis for treatment. Nasal washings and aspirates are unacceptable for Xpert Xpress  SARS-CoV-2/FLU/RSV testing.  Fact Sheet for Patients: EntrepreneurPulse.com.au  Fact Sheet for Healthcare Providers: IncredibleEmployment.be  This test is not yet approved or cleared by the [place] FDA and has been authorized for detection and/or diagnosis of SARS-CoV-2 by FDA under an Emergency Use Authorization (EUA). This EUA will remain in effect (meaning this test can be used) for the duration of the COVID-19 declaration under Section 564(b)(1) of the Act, 21 U.S.C. section 360bbb-3(b)(1), unless the authorization is terminated or revoked.  Performed at [facility], [place] [address], [place], [place] [address]       Radiology Studies: CT HEAD WO CONTRAST  Result Date: 08/01/2020 CLINICAL DATA:  Fall EXAM: CT HEAD WITHOUT CONTRAST TECHNIQUE: Contiguous axial images were obtained from the base of the skull through the vertex without intravenous contrast. COMPARISON:  07/04/2020 FINDINGS: Brain: There is no acute intracranial hemorrhage, mass effect, or edema. Gray-white differentiation is preserved. There is no extra-axial fluid collection. Patchy and confluent areas of hypoattenuation in the supratentorial white matter are nonspecific a probably reflects stable chronic microvascular ischemic changes. Prominence of the ventricles and sulci reflects stable parenchymal volume loss. Vascular: There is mild atherosclerotic calcification at the skull base. Skull: Calvarium is unremarkable. Sinuses/Orbits: Layering secretions in the left sphenoid and maxillary sinuses. Evidence of  chronic sinus inflammation. Other: Right posterior scalp soft tissue swelling IMPRESSION: No evidence of acute intracranial injury. Electronically Signed   By: Macy Mis M.D.   On: 08/01/2020 16:40   CT CERVICAL SPINE WO CONTRAST  Result Date: 08/01/2020 CLINICAL DATA:  Fall EXAM: CT CERVICAL SPINE WITHOUT CONTRAST TECHNIQUE: Multidetector CT imaging of the  cervical spine was performed without intravenous contrast. Multiplanar CT image reconstructions were also generated. COMPARISON:  March 2021 FINDINGS: Alignment: Anteroposterior alignment is maintained. Skull base and vertebrae: Stable vertebral body heights. No acute cervical spine fracture. Soft tissues and spinal canal: No prevertebral fluid or swelling. No visible canal hematoma. Disc levels: Multilevel degenerative changes are stable in appearance. Upper chest: No apical lung mass. Other: Calcified plaque at the common carotid bifurcations. IMPRESSION: No acute cervical spine fracture. Electronically Signed   By: Macy Mis M.D.   On: 08/01/2020 16:43   MR BRAIN WO CONTRAST  Result Date: 08/02/2020 CLINICAL DATA:  Seizure.  History of dementia. EXAM: MRI HEAD WITHOUT CONTRAST TECHNIQUE: Multiplanar, multiecho pulse sequences of the brain and surrounding structures were obtained without intravenous contrast. COMPARISON:  CT head 08/01/2020.  MRI head 12/23/2017 FINDINGS: Brain: Progression of atrophy since 2019. Periventricular and deep white matter hyperintensity bilaterally with mild progression since 2019. Brainstem and cerebellum intact. Negative for acute infarct, hemorrhage, mass. Negative for hydrocephalus. Vascular: Normal arterial flow voids. Skull and upper cervical spine: Negative Sinuses/Orbits: Mucosal edema paranasal sinuses. Bilateral cataract extraction Other: None IMPRESSION: No acute abnormality Progression of atrophy and chronic microvascular ischemic change in the white matter since 2019 MRI. Electronically Signed   By: Franchot Gallo M.D.   On: 08/02/2020 10:54   EEG adult  Result Date: 08/02/2020 Lora Havens, MD     08/02/2020  9:18 AM Patient Name: Manasa Spease MRN: [id number] Epilepsy Attending: Lora Havens Referring Physician/Provider: Dr. Gean Birchwood Date: 08/02/2020 Duration: 26.05 mins Patient history: 79 year old female with history of recurrent  syncope presented with paroxysmal events of shaking followed by confusion lasting about 30 seconds. EEG to evaluate for seizures. Level of alertness: Awake AEDs during EEG study: None Technical aspects: This EEG study was done with scalp electrodes positioned according to the 10-20 International system of electrode placement. Electrical activity was acquired at a sampling rate of 500Hz  and reviewed with a high frequency filter of 70Hz  and a low frequency filter of 1Hz . EEG data were recorded continuously and digitally stored. Description: No clear posterior dominant rhythm was seen.  EEG showed continuous generalized polymorphic 3 to 6 Hz theta-delta slowing.  Hyperventilation and photic stimulation were not performed.   ABNORMALITY -Continuous slow, generalized IMPRESSION: This study is suggestive of moderate diffuse encephalopathy, nonspecific etiology. No seizures or epileptiform discharges were seen throughout the recording. Priyanka Barbra Sarks     Scheduled Meds: . citalopram  40 mg Oral Daily  . enoxaparin (LOVENOX) injection  30 mg Subcutaneous Q24H  . loratadine  10 mg Oral Daily  . mirtazapine  15 mg Oral QPM  . montelukast  10 mg Oral QPM  . pravastatin  40 mg Oral q1800   Continuous Infusions:   LOS: 0 days   Time Spent in minutes   30 minutes  Avanti Jetter D.O. on 08/02/2020 at 12:56 PM  Between 7am to 7pm - Please see pager noted on [email or web address]  After 7pm go to [email or web address]  And look for the night coverage person covering for me after hours  [facility] Office  [phone]

## 2020-08-02 NOTE — Progress Notes (Signed)
EEG complete - results pending 

## 2020-08-02 NOTE — ED Notes (Signed)
Patient transported to MRI 

## 2020-08-02 NOTE — ED Triage Notes (Signed)
Pt comes from home via [facility] for syncopal episode or possible seizure witnessed by EMS, shaking and incontinence lasted about 30 seconds, post ictal for about 5 minutes with EMS.

## 2020-08-02 NOTE — Progress Notes (Signed)
TOC CM contacted [place], rep Ramond Marrow. She will review for [facility]. Will need new HH RN, PT, OT and aide orders with [facility].[place], [place] ED TOC CM [phone]

## 2020-08-02 NOTE — Progress Notes (Addendum)
Subjective: Patient denies any new concerns. Called patient's [name], reported patient had her first episode on 05/06/2020. She was sitting at table, had eaten, said she didnt feel good, put her hands to chest, jerked for maybe 10sec, lost consciousness, within 31mins regained consciousness, confused afetrwards. Has had 5-5 similar episodes. Did vomit during couple of these episodes. Concern for laryngeal spasm, saw Pulmonology and now on prilosec. Continues to have episodes of staring/less responsiveness, has twitching of legs and arms, +urine incontinence. Cardiology states not cardiogenic in origin.   No new meds. No UTI, no other recent illness  ROS: Unable to obtain due to poor mental status  Examination  Vital signs in last 24 hours: Temp:  [97.6 F (36.4 C)-97.9 F (36.6 C)] 97.7 F (36.5 C) (12/08 1200) Pulse Rate:  [68-89] 82 (12/08 0915) Resp:  [14-22] 17 (12/08 1200) BP: (97-151)/(51-72) 115/53 (12/08 1200) SpO2:  [93 %-100 %] 95 % (12/08 1200)  General: lying in bed, NAD CVS: pulse-normal rate and rhythm RS: breathing comfortably, coarse breath sounds BL Extremities: normal, warm  Neuro: MS: Alert, oriented to self only, not to place and time, follows commands, able to tell me 5+4=9, days of week but not 100-7, unable to spell WORLD CN: pupils equal and reactive,  EOMI, face symmetric, tongue midline, normal sensation over face Motor: antigravity strength in all 4 extremities  Basic Metabolic Panel: Recent Labs  Lab 07/27/20 0614 07/27/20 0614 07/28/20 0607 07/28/20 0607 07/29/20 0723 07/30/20 0634 08/02/20 0015 08/02/20 0616  NA 139  --  137  --  138 140 138  --   K 3.5  --  4.0  --  3.6 3.7 3.7  --   CL 106  --  106  --  103 107 103  --   CO2 21*  --  21*  --  23 24 21*  --   GLUCOSE 98  --  106*  --  114* 101* 147*  --   BUN 14  --  <5*  --  7* 7* 19  --   CREATININE 0.66   < > 0.67  --  0.57 0.64 0.99 0.82  CALCIUM 8.4*   < > 8.2*   < > 8.3* 8.6* 9.1   --   MG 1.7  --  1.9  --  2.0 1.9 1.7  --   PHOS 2.6  --  1.8*  --  2.4* 2.5  --   --    < > = values in this interval not displayed.    CBC: Recent Labs  Lab 07/26/20 1540 07/26/20 2200 07/28/20 0607 07/29/20 0723 07/30/20 0634 08/02/20 0015 08/02/20 0616  WBC 9.9   < > 15.5* 10.6* 8.3 14.5* 12.2*  NEUTROABS 5.5  --  11.4* 6.5 4.6 11.7*  --   HGB 13.5   < > 12.4 11.9* 12.0 12.9 12.2  HCT 39.0   < > 37.2 35.5* 35.9* 38.1 36.0  MCV 90.3   < > 92.3 91.0 91.8 91.6 91.6  PLT 334   < > 305 304 346 399 392   < > = values in this interval not displayed.    Coagulation Studies: No results for input(s): LABPROT, INR in the last 72 hours.  Imaging MR Brain wo contrast 08/02/2020: No acute abnormality Progression of atrophy and chronic microvascular ischemic change in the white matter since 2019 MRI.   ASSESSMENT AND PLAN: 79yo F with Alzheimer's dementia and multiple seizure like episodes.  Seizure Alzheimer's dementia  Leucocytosis ( improving) Hypoalbuminemia Transaminitis -Patient has a diagnosis of Alzheimer's dementia which increases the risk for epilepsy.  She has also had possibly more than 1 seizure-like episodes.   -Leukocytosis likely secondary to seizure.   Recommendations - Discussed with patient's daughter who would prefer for patient to be on antiepileptic medications. - We will start patient on Keppra 250 mg twice daily - Seizure precautions - PRN IV versed 2 mg for seizures lasting 1 1 5  minutes - Management of rest of comorbidities per primary team - Follow-up with neurology in 6 to 8 weeks after discharge   [facility] For questions after 5pm please refer to AMION to reach the Neurologist on call

## 2020-08-02 NOTE — Telephone Encounter (Signed)
Dr. Sherley Bounds  We are back at ED. Mom hasn't been home since [place]. [employer] rep at ED brought mask. I'm to call them tomorrow. She isn't on O2. Thanks! Z

## 2020-08-03 DIAGNOSIS — R569 Unspecified convulsions: Secondary | ICD-10-CM | POA: Diagnosis not present

## 2020-08-03 DIAGNOSIS — E7800 Pure hypercholesterolemia, unspecified: Secondary | ICD-10-CM | POA: Diagnosis not present

## 2020-08-03 LAB — COMPREHENSIVE METABOLIC PANEL
ALT: 51 U/L — ABNORMAL HIGH (ref 0–44)
AST: 58 U/L — ABNORMAL HIGH (ref 15–41)
Albumin: 3.1 g/dL — ABNORMAL LOW (ref 3.5–5.0)
Alkaline Phosphatase: 53 U/L (ref 38–126)
Anion gap: 11 (ref 5–15)
BUN: 9 mg/dL (ref 8–23)
CO2: 25 mmol/L (ref 22–32)
Calcium: 9.2 mg/dL (ref 8.9–10.3)
Chloride: 105 mmol/L (ref 98–111)
Creatinine, Ser: 0.77 mg/dL (ref 0.44–1.00)
GFR, Estimated: >60 mL/min (ref >60)
Glucose, Bld: 99 mg/dL (ref 70–99)
Potassium: 3.7 mmol/L (ref 3.5–5.1)
Sodium: 141 mmol/L (ref 135–145)
Total Bilirubin: 0.3 mg/dL (ref 0.3–1.2)
Total Protein: 7.7 g/dL (ref 6.5–8.1)

## 2020-08-03 LAB — CBC
HCT: 36.9 % (ref 36.0–46.0)
Hemoglobin: 12.8 g/dL (ref 12.0–15.0)
MCH: 31.4 pg (ref 26.0–34.0)
MCHC: 34.7 g/dL (ref 30.0–36.0)
MCV: 90.4 fL (ref 80.0–100.0)
Platelets: 380 10*3/uL (ref 150–400)
RBC: 4.08 MIL/uL (ref 3.87–5.11)
RDW: 13.5 % (ref 11.5–15.5)
WBC: 8.4 10*3/uL (ref 4.0–10.5)
nRBC: 0 % (ref 0.0–0.2)

## 2020-08-03 MED ORDER — LEVETIRACETAM 250 MG PO TABS: 250.0000 mg | ORAL_TABLET | Freq: Two times a day (BID) | ORAL | 1 refills | Status: AC

## 2020-08-03 MED ORDER — LOVASTATIN 40 MG PO TABS: 40.0000 mg | ORAL_TABLET | Freq: Every evening | ORAL | Status: AC

## 2020-08-03 MED ORDER — ENOXAPARIN SODIUM 40 MG/0.4ML ~~LOC~~ SOLN: 40.0000 mg | SUBCUTANEOUS | Status: DC

## 2020-08-03 MED ORDER — LORATADINE 10 MG PO TABS
10.0000 mg | ORAL_TABLET | Freq: Every day | ORAL | 0 refills | Status: DC
Start: 2020-08-04 — End: 2020-08-03

## 2020-08-03 MED ORDER — CITALOPRAM HYDROBROMIDE 20 MG PO TABS
20.0000 mg | ORAL_TABLET | Freq: Every day | ORAL | Status: DC
  Filled 2020-08-03: qty 1

## 2020-08-03 MED ORDER — CITALOPRAM HYDROBROMIDE 20 MG PO TABS: 20.0000 mg | ORAL_TABLET | Freq: Every day | ORAL | 0 refills | Status: AC

## 2020-08-03 MED ORDER — SODIUM CHLORIDE 0.9 % IV SOLN
250.0000 mg | Freq: Once | INTRAVENOUS | Status: AC
  Administered 2020-08-03: 250 mg via INTRAVENOUS
  Filled 2020-08-03: qty 2.5

## 2020-08-03 NOTE — Evaluation (Signed)
Occupational Therapy Evaluation Patient Details Name: Oaklee Esther MRN: [id number] DOB: 04/21/1941 Today's Date: 08/03/2020    History of Present Illness 79yo female brought from home to the ED after possible seizure activity and related fall at home. CTH, MRI, and cervical imaging all negative for acute injury/changes. PMH anxiety, HLD, migrane with aura, dementia   Clinical Impression   Pt in bed with covers pushed down stating that she needs help with her pillows, nose and covers. Pt pleasantly confused and keeping eyes closed 50% of time. Pt required max encouragement and multimodal cues to sit EOB to adjust linens and or pt to lateral scoot towards rail/HOB. Pt sat EOB 2 minutes for simple grooming tasks and resistive to any further EOB activity. Per RN and chart, pt's daughter is having a hard time caring for her at home. Pt not appropriate for acute OT services at this time and recommend SNF/memory care for d/c. OT will sign off    Follow Up Recommendations  SNF;Supervision/Assistance - 24 hour (memory care)    Equipment Recommendations  None recommended by OT    Recommendations for Other Services       Precautions / Restrictions Precautions Precautions: Fall Restrictions Weight Bearing Restrictions: No      Mobility Bed Mobility Overal bed mobility: Needs Assistance Bed Mobility: Rolling;Supine to Sit;Sit to Supine Rolling: Min guard   Supine to sit: Min assist Sit to supine: Min guard   General bed mobility comments: min A to elevate trunk after max multimodal cues to initiate sitting EOB. Pt sat EOB x 2 minutes    Transfers                 General transfer comment: refused/resisted    Balance Overall balance assessment: Needs assistance   Sitting balance-Leahy Scale: Fair Sitting balance - Comments: forward flexed posture of trunk                                   ADL either performed or assessed with clinical judgement    ADL Overall ADL's : Needs assistance/impaired                                       General ADL Comments: set up/sup to wash face and hands wth mod vebal cues to initiate seated EOB, unable to get pt to perform or simulate any other ADL/selfcare tasks     Vision Baseline Vision/History:  (unable to properly assess due to cognitive impairments) Patient Visual Report: Other (comment) (did not report any)       Perception     Praxis      Pertinent Vitals/Pain Pain Assessment: Faces Faces Pain Scale: No hurt Pain Intervention(s): Limited activity within patient's tolerance;Monitored during session     Hand Dominance  (did not specify due to cognition)   Extremity/Trunk Assessment Upper Extremity Assessment Upper Extremity Assessment: Difficult to assess due to impaired cognition   Lower Extremity Assessment Lower Extremity Assessment: Difficult to assess due to impaired cognition   Cervical / Trunk Assessment Cervical / Trunk Assessment: Normal   Communication Communication Communication: Expressive difficulties   Cognition Arousal/Alertness: Lethargic Behavior During Therapy: Flat affect Overall Cognitive Status: History of cognitive impairments - at baseline  General Comments: hx of dementia- says she needs help with her pillow, nose and covers. Perseverates on being cold. Max multimodal cues required to get pt ti sit EOB to assist her position in bed once returned to supine   General Comments  unable to get to EOB/OOB today due to physical resistance    Exercises     Shoulder Instructions      Home Living Family/patient expects to be discharged to:: Private residence Living Arrangements: Children Available Help at Discharge: Family Type of Home: [place]: One level     Bathroom Shower/Tub: Other (comment) (walk in tub)         Home Equipment: None   Additional Comments:  patient poor historian/did not answer questions today- all info taken from prior charting      Prior Functioning/Environment Level of Independence: Independent        Comments: per DTR patient completes her dressing, utilizes walk in tub for bathing, does her own toileting without AD. goes to adult day care program 5 days a week         OT Problem List: Decreased cognition;Decreased activity tolerance;Decreased knowledge of use of DME or AE;Decreased safety awareness      OT Treatment/Interventions:      OT Goals(Current goals can be found in the care plan section) Acute Rehab OT Goals Patient Stated Goal: "get warm"  OT Frequency:     Barriers to D/C:            Co-evaluation              AM-PAC OT "6 Clicks" Daily Activity     Outcome Measure Help from another person eating meals?: None Help from another person taking care of personal grooming?: None Help from another person toileting, which includes using toliet, bedpan, or urinal?: A Little Help from another person bathing (including washing, rinsing, drying)?: A Little Help from another person to put on and taking off regular upper body clothing?: A Little Help from another person to put on and taking off regular lower body clothing?: A Little 6 Click Score: 20   End of Session    Activity Tolerance: Patient limited by fatigue;Other (comment) (cognition) Patient left: in bed;with call bell/phone within reach;with bed alarm set  OT Visit Diagnosis: Muscle weakness (generalized) (M62.81);Other symptoms and signs involving cognitive function;Cognitive communication deficit (R41.841)                Time: 8588-5027 OT Time Calculation (min): 26 min Charges:  OT General Charges $OT Visit: 1 Visit OT Evaluation $OT Eval Moderate Complexity: 1 Mod OT Treatments $Self Care/Home Management : 8-22 mins    Britt Bottom 08/03/2020, 1:36 PM

## 2020-08-03 NOTE — Discharge Summary (Signed)
Physician Discharge Summary  Melinda Griffin IOE:[id number] DOB: 01/25/1941 DOA: 08/02/2020  PCP: Seward Carol, MD  Admit date: 08/02/2020 Discharge date: 08/03/2020  Time spent: 45 minutes  Recommendations for Outpatient Follow-up:  Patient will be discharged to home with home health.  Patient will need to follow up with primary care provider within one week of discharge, repeat CMP.  Hold cholesterol medication until liver tests are back to normal. Follow up with neurology. Patient should continue medications as prescribed.  Patient should follow a heart healthy diet.    Discharge Diagnoses:  Seizure-like activity Dementia Depression/anxiety Hyperlipidemia Asthma Leukocytosis Elevated LFTs  Discharge Condition: Stable  Diet recommendation: heart healthy  Filed Weights   08/02/20 1936 08/03/20 0616  Weight: 52.2 kg 52.2 kg    History of present illness:  on 08/02/2020 by Dr. Bryan Lemma Hathcock [name], hyperlipidemia, [name]. CT head C-spine was unremarkable and patient was taken home by patient's daughter. Patient's daughter states that after increasing home patient was given a bath and while drying her understanding. Patient had a staring spell and she was saying she did not feel well and her daughter made her sit on the commode following which she became limp and her all 4 extremities started shaking. Lasted for about half a minute. After which patient started me to lay on the floor. EMS was called. After EMS arrived in 20 minutes patient started having this shaking spells again and patient appeared confused but no incontinence of urine. But during prior episodes patient did have incontinence of urine. While EMS was in the house patient had these  shaking spells he lasted for less than half a minute at least 4 episodes within 10 minutes with intervals of 2 minutes in between.  Hospital Course:  Seizure-like activity -history of recurrent syncope -Patient witnessed by EMS to have a generalized tonic-clonic activity -Per daughter, patient had 4 episodes -Neurology consulted and appreciated, recommended EEG as well as MRI -Echocardiogram done 1 week ago showed an EF of 60 to 18%, grade 2 diastolic dysfunction -Per daughter, patient does not do well with Ativan -CT head showed no evidence of acute intercranial injury -MRI brain showed no acute abnormality.  Progression of atrophy and chronic microvascular ischemic change and white matter since 2019 MRI -EEG suggestive of moderate diffuse encephalopathy, nonspecific etiology.  No seizures or left form discharges were seen throughout the recording -Continue Keppra  Dementia -Not presently on any medications -Per daughter, patient has not developed Ativan or Haldol but is okay with Zyprexa for agitation  Depression/anxiety -Continue mirtazapine, citalopram  Hyperlipidemia -Hold statin due to elevated LFTs -would repeat LFTs in one week and then restart statin if LFTs are back within normal limits  Asthma -Continue Singulair, albuterol as needed  Leukocytosis -Possibly reactive.  Patient has no signs of infection at this time -Resolved  Elevated LFTs -AST 130, ALT 63 on admission -LFTs trending downward -Repeat CMP in one week  Procedures: EEG  Consultations: Neurology   Code status: DNR  Discharge Exam: Vitals:   08/03/20 0616 08/03/20 1147  BP: 113/74 (!) 116/53  Pulse:  91  Resp: 14 16  Temp: (!) 97.5 F (36.4 C) 97.6 F (36.4 C)  SpO2: 95% 97%    Exam  General: Well developed, elderly, chronically ill appearing, NAD  HEENT: NCAT, mucous  membranes moist.   Cardiovascular: S1 S2 auscultated, RRR  Respiratory: Clear to auscultation bilaterally,  no wheezing  Abdomen: Soft, nontender, nondistended, + bowel sounds  Extremities: warm dry without cyanosis clubbing or edema  Neuro: Awake and alert, hard of hearing. Confused (dementia at baseline)   Discharge Instructions Discharge Instructions    Discharge instructions   Complete by: As directed    Patient will be discharged to home with home health services and palliative care to follow.  Patient will need to follow up with primary care provider within one week of discharge, repeat CMP.  Hold cholesterol medication until liver tests are back to normal. Follow up with neurology. Patient should continue medications as prescribed.  Patient should follow a heart healthy diet.     Allergies as of 08/03/2020      Reactions   Shrimp [shellfish Allergy] Anaphylaxis   Aspirin    Advised by MD previously not to take   Ceftin    Dizzy & Diarrhea   Ciprofloxacin Hcl    Dizzy, diarrhea   Ativan [lorazepam] Anxiety   Agitation - tolerates Zyprexa   Haldol [haloperidol] Anxiety   Agitation - tolerates Zyprexa      Medication List    TAKE these medications   albuterol 108 (90 Base) MCG/ACT inhaler Commonly known as: VENTOLIN HFA Inhale 1-2 puffs into the lungs every 6 (six) hours as needed for wheezing or shortness of breath.   albuterol (2.5 MG/3ML) 0.083% nebulizer solution Commonly known as: PROVENTIL Take 3 mLs (2.5 mg total) by nebulization every 6 (six) hours as needed for wheezing or shortness of breath.   budesonide 0.25 MG/2ML nebulizer solution Commonly known as: Pulmicort Take 2 mLs (0.25 mg total) by nebulization 2 (two) times daily. Partial refill ok   Calcium 500 + D 500-200 MG-UNIT Tabs Generic drug: Calcium Carb-Cholecalciferol Take 1 tablet by mouth daily.   cetirizine 10 MG tablet Commonly known as: ZYRTEC Take 10 mg by mouth daily.   citalopram 20 MG tablet Commonly known as: CELEXA Take 1 tablet (20 mg total) by mouth daily. Start taking on: August 04, 2020 What changed:   medication strength  how much to take   levETIRAcetam 250 MG tablet Commonly known as: KEPPRA Take 1 tablet (250 mg total) by mouth 2 (two) times daily.   lovastatin 40 MG tablet Commonly known as: MEVACOR Take 1 tablet (40 mg total) by mouth every evening. Hold until liver function tests are back to normal. What changed: See the new instructions.   Melatonin 10 MG Tabs Take 10 mg by mouth at bedtime as needed (sleep).   mirtazapine 15 MG tablet Commonly known as: REMERON Take 15 mg by mouth every evening.   montelukast 10 MG tablet Commonly known as: SINGULAIR TAKE 1 TABLET BY MOUTH EVERY DAY What changed: when to take this   omeprazole 10 MG capsule Commonly known as: PRILOSEC Take 10 mg by mouth in the morning and at bedtime.   VITAMIN B-12 PO Take 1 tablet by mouth daily.      Allergies  Allergen Reactions  . 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 Aspirin     Advised by MD previously not to take  . Ceftin     Dizzy & Diarrhea  . Ciprofloxacin Hcl     Dizzy, diarrhea  . Ativan [Lorazepam] Anxiety    Agitation - tolerates Zyprexa  . Haldol [Haloperidol] Anxiety    Agitation - tolerates Zyprexa    Follow-up Information  Care, [facility] Follow up.   Specialty: Home Health Services Why: Prudencio Pair , Social Worker Contact information: [place] [address] [phone]        Seward Carol, MD. Schedule an appointment as soon as possible for a visit in 1 week(s).   Specialty: Internal Medicine Why: Hospital follow up Contact information: [address] [place] [address] [phone]                The results of significant diagnostics from this hospitalization (including imaging, microbiology, ancillary and laboratory) are listed below for reference.    Significant Diagnostic Studies: DG Chest 2 View  Result Date: 07/25/2020 CLINICAL DATA:   Shortness of breath. Acute respiratory failure with hypoxia. EXAM: CHEST - 2 VIEW COMPARISON:  06/14/2020 FINDINGS: The heart size and mediastinal contours are within normal limits. Increased prominence of interstitial lung markings is seen throughout both lungs, suspicious for viral or other atypical pneumonitis; interstitial edema is considered less likely. No evidence pulmonary consolidation or pleural effusion. IMPRESSION: Increased diffuse interstitial prominence, suspicious for viral or other atypical pneumonitis, with interstitial edema considered less likely. Electronically Signed   By: Marlaine Hind M.D.   On: 07/25/2020 18:03   CT HEAD WO CONTRAST  Result Date: 08/01/2020 CLINICAL DATA:  Fall EXAM: CT HEAD WITHOUT CONTRAST TECHNIQUE: Contiguous axial images were obtained from the base of the skull through the vertex without intravenous contrast. COMPARISON:  07/04/2020 FINDINGS: Brain: There is no acute intracranial hemorrhage, mass effect, or edema. Gray-white differentiation is preserved. There is no extra-axial fluid collection. Patchy and confluent areas of hypoattenuation in the supratentorial white matter are nonspecific a probably reflects stable chronic microvascular ischemic changes. Prominence of the ventricles and sulci reflects stable parenchymal volume loss. Vascular: There is mild atherosclerotic calcification at the skull base. Skull: Calvarium is unremarkable. Sinuses/Orbits: Layering secretions in the left sphenoid and maxillary sinuses. Evidence of chronic sinus inflammation. Other: Right posterior scalp soft tissue swelling IMPRESSION: No evidence of acute intracranial injury. Electronically Signed   By: Macy Mis M.D.   On: 08/01/2020 16:40   CT Head Wo Contrast  Result Date: 07/05/2020 CLINICAL DATA:  Mental status change EXAM: CT HEAD WITHOUT CONTRAST TECHNIQUE: Contiguous axial images were obtained from the base of the skull through the vertex without intravenous  contrast. COMPARISON:  CT 11/04/2019 FINDINGS: Brain: No evidence of acute infarction, hemorrhage, hydrocephalus, extra-axial collection, visible mass lesion or mass effect. Symmetric prominence of the ventricles, cisterns and sulci compatible with parenchymal volume loss. Patchy and confluent areas of white matter hypoattenuation are most compatible with chronic microvascular angiopathy. Vascular: Atherosclerotic calcification of the carotid siphons and intradural vertebral arteries. No hyperdense vessel. Skull: No calvarial fracture or suspicious osseous lesion. No scalp swelling or hematoma. Sinuses/Orbits: Minimal mural thickening throughout the paranasal sinuses no layering air-fluid levels or pneumatized secretions. Mastoid air cells are well aerated. Middle ear cavities are clear. Orbital structures are unremarkable aside from prior lens extractions. Other: None. IMPRESSION: 1. No acute intracranial findings. 2. Chronic microvascular angiopathy and parenchymal volume loss. Electronically Signed   By: Lovena Le M.D.   On: 07/05/2020 00:15   CT ANGIO CHEST PE W OR WO CONTRAST  Result Date: 07/27/2020 CLINICAL DATA:  Hypoxia EXAM: CT ANGIOGRAPHY CHEST WITH CONTRAST TECHNIQUE: Multidetector CT imaging of the chest was performed using the standard protocol during bolus administration of intravenous contrast. Multiplanar CT image reconstructions and MIPs were obtained to evaluate  the vascular anatomy. CONTRAST:  103mL OMNIPAQUE IOHEXOL 350 MG/ML SOLN COMPARISON:  Chest radiograph July 27, 2020 FINDINGS: Cardiovascular: There is no demonstrable pulmonary embolus. There is no thoracic aortic aneurysm or dissection. There are scattered foci of calcification in visualized great vessels. There are foci of aortic atherosclerosis as well as foci of coronary artery calcification. There is no pericardial effusion or pericardial thickening. Mediastinum/Nodes: Thyroid appears unremarkable. There is no appreciable  thoracic adenopathy. No esophageal lesions are evident. Lungs/Pleura: Small granuloma noted in the right upper lobe. Mild scarring in the extreme apices. There are ill-defined areas of airspace opacity in each lower lobe region, somewhat more notable on the right than on the left. Small area of opacity in the inferior lingula, likely a small focus of pneumonia. There is bibasilar atelectasis. No appreciable pleural effusions. Upper Abdomen: Visualized upper abdominal structures appear unremarkable. 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 No blastic or lytic bone lesions. No evident chest wall lesions. Review of the MIP images confirms the above findings. IMPRESSION: 1. No evident pulmonary embolus. No thoracic aortic aneurysm or dissection. There are foci of aortic atherosclerosis as well as foci great vessel and coronary artery calcification. 2. Areas of somewhat ill-defined airspace opacity in the lower lung regions and inferior lingula, felt to represent multifocal pneumonia. Advise correlation with COVID-19 status check in this regard. No frank consolidation. 3. Small calcified granuloma right upper lobe. Scarring in the apices. 4.  No evident adenopathy. Aortic Atherosclerosis (ICD10-I70.0). Electronically Signed   By: Lowella Grip III M.D.   On: 07/27/2020 11:09   CT CERVICAL SPINE WO CONTRAST  Result Date: 08/01/2020 CLINICAL DATA:  Fall EXAM: CT CERVICAL SPINE WITHOUT CONTRAST TECHNIQUE: Multidetector CT imaging of the cervical spine was performed without intravenous contrast. Multiplanar CT image reconstructions were also generated. COMPARISON:  March 2021 FINDINGS: Alignment: Anteroposterior alignment is maintained. Skull base and vertebrae: Stable vertebral body heights. No acute cervical spine fracture. Soft tissues and spinal canal: No prevertebral fluid or swelling. No visible canal hematoma. Disc levels: Multilevel degenerative changes are stable in appearance. Upper  chest: No apical lung mass. Other: Calcified plaque at the common carotid bifurcations. IMPRESSION: No acute cervical spine fracture. Electronically Signed   By: Macy Mis M.D.   On: 08/01/2020 16:43   MR BRAIN WO CONTRAST  Result Date: 08/02/2020 CLINICAL DATA:  Seizure.  History of dementia. EXAM: MRI HEAD WITHOUT CONTRAST TECHNIQUE: Multiplanar, multiecho pulse sequences of the brain and surrounding structures were obtained without intravenous contrast. COMPARISON:  CT head 08/01/2020.  MRI head 12/23/2017 FINDINGS: Brain: Progression of atrophy since 2019. Periventricular and deep white matter hyperintensity bilaterally with mild progression since 2019. Brainstem and cerebellum intact. Negative for acute infarct, hemorrhage, mass. Negative for hydrocephalus. Vascular: Normal arterial flow voids. Skull and upper cervical spine: Negative Sinuses/Orbits: Mucosal edema paranasal sinuses. Bilateral cataract extraction Other: None IMPRESSION: No acute abnormality Progression of atrophy and chronic microvascular ischemic change in the white matter since 2019 MRI. Electronically Signed   By: Franchot Gallo M.D.   On: 08/02/2020 10:54   CARDIAC EVENT MONITOR  Result Date: 07/14/2020 Indication: Syncope Monitor duration: 11 days Minimum HR (bpm): 51 Maximum HR (bpm): 141 Supraventricular Ectopy: <1% SVT: none Ventricular Ectopy: <1% NSVT: none Ventricular Tachycardia: none Pauses: none AV block: none Atrial fibrillation: none Diary events: none   Rare ectopy, no findings to support an etiology for syncope.  DG CHEST PORT 1 VIEW  Result Date: 07/28/2020 CLINICAL DATA:  Shortness of breath. EXAM: PORTABLE CHEST 1 VIEW COMPARISON:  CT 07/27/2020.  Chest x-ray 07/27/2020. FINDINGS: Mediastinum and hilar structures normal. Heart size normal. Persistent mild bibasilar atelectasis and interstitial prominence. No focal alveolar infiltrates. No pleural effusion or pneumothorax. IMPRESSION: Persistent mild  bibasilar atelectasis and interstitial prominence. No focal alveolar infiltrate. Chest is unchanged from prior exams. Electronically Signed   By: Marcello Moores  Register   On: 07/28/2020 06:53   DG Chest Port 1 View  Result Date: 07/27/2020 CLINICAL DATA:  Hypoxia. EXAM: PORTABLE CHEST 1 VIEW COMPARISON:  07/25/2020. FINDINGS: Mediastinum and hilar structures normal. Interim improvement of bilateral interstitial prominence. Low lung volumes with mild bibasilar atelectasis. No pleural effusion or pneumothorax. No acute bony abnormality. IMPRESSION: Interim improvement of bilateral interstitial prominence. Low lung volumes with mild bibasilar atelectasis. Electronically Signed   By: Marcello Moores  Register   On: 07/27/2020 06:04   EEG adult  Result Date: 08/02/2020 Lora Havens, MD     08/02/2020  9:18 AM Patient Name: Melinda Griffin MRN: [id number] Epilepsy Attending: Lora Havens Referring Physician/Provider: Dr. Gean Birchwood Date: 08/02/2020 Duration: 26.05 mins Patient history: 79 year old female with history of recurrent syncope presented with paroxysmal events of shaking followed by confusion lasting about 30 seconds. EEG to evaluate for seizures. Level of alertness: Awake AEDs during EEG study: None Technical aspects: This EEG study was done with scalp electrodes positioned according to the 10-20 International system of electrode placement. Electrical activity was acquired at a sampling rate of 500Hz  and reviewed with a high frequency filter of 70Hz  and a low frequency filter of 1Hz . EEG data were recorded continuously and digitally stored. Description: No clear posterior dominant rhythm was seen.  EEG showed continuous generalized polymorphic 3 to 6 Hz theta-delta slowing.  Hyperventilation and photic stimulation were not performed.   ABNORMALITY -Continuous slow, generalized IMPRESSION: This study is suggestive of moderate diffuse encephalopathy, nonspecific etiology. No seizures or  epileptiform discharges were seen throughout the recording. Lora Havens   ECHOCARDIOGRAM COMPLETE  Result Date: 07/27/2020    ECHOCARDIOGRAM REPORT   Patient Name:   ATHZIRY MILLICAN Date of Exam: 07/27/2020 Medical Rec #:  [id number]                 Height:       61.0 in Accession #:    [id number]                Weight:       121.3 lb Date of Birth:  27-Dec-1940                  BSA:          1.527 m Patient Age:    49 years                  BP:           129/56 mmHg Patient Gender: F                         HR:           78 bpm. Exam Location:  Inpatient Procedure: 2D Echo, Cardiac Doppler and Color Doppler Indications:    R55 Syncope  History:        Patient has no prior history of Echocardiogram examinations.                 Signs/Symptoms:Altered Mental Status, Shortness of Breath and  Dyspnea; Risk Factors:Dyslipidemia. Hypoxia.  Sonographer:    Roseanna Rainbow RDCS Referring Phys: [id number] [facility]  Sonographer Comments: Technically difficult study due to poor echo windows. Image acquisition challenging due to uncooperative patient. Patient confused and would not allow me to turn her for exam. IMPRESSIONS  1. Left ventricular ejection fraction, by estimation, is 60 to 65%. The left ventricle has normal function. The left ventricle has no regional wall motion abnormalities. Left ventricular diastolic parameters are consistent with Grade I diastolic dysfunction (impaired relaxation).  2. Right ventricular systolic function is normal. The right ventricular size is normal.  3. The mitral valve is normal in structure. Trivial mitral valve regurgitation. No evidence of mitral stenosis.  4. The aortic valve has an indeterminant number of cusps. Aortic valve regurgitation is not visualized. No aortic stenosis is present.  5. The inferior vena cava is normal in size with greater than 50% respiratory variability, suggesting right atrial pressure of 3 mmHg. FINDINGS  Left Ventricle: Left  ventricular ejection fraction, by estimation, is 60 to 65%. The left ventricle has normal function. The left ventricle has no regional wall motion abnormalities. The left ventricular internal cavity size was normal in size. There is  no left ventricular hypertrophy. Left ventricular diastolic parameters are consistent with Grade I diastolic dysfunction (impaired relaxation). Right Ventricle: The right ventricular size is normal. Right ventricular systolic function is normal. Left Atrium: Left atrial size was normal in size. Right Atrium: Right atrial size was normal in size. Pericardium: There is no evidence of pericardial effusion. Mitral Valve: The mitral valve is normal in structure. Mild mitral annular calcification. Trivial mitral valve regurgitation. No evidence of mitral valve stenosis. Tricuspid Valve: The tricuspid valve is normal in structure. Tricuspid valve regurgitation is trivial. No evidence of tricuspid stenosis. Aortic Valve: The aortic valve has an indeterminant number of cusps. Aortic valve regurgitation is not visualized. No aortic stenosis is present. Pulmonic Valve: The pulmonic valve was not well visualized. Pulmonic valve regurgitation is not visualized. No evidence of pulmonic stenosis. Aorta: The aortic root is normal in size and structure. Venous: The inferior vena cava is normal in size with greater than 50% respiratory variability, suggesting right atrial pressure of 3 mmHg.   LEFT VENTRICLE PLAX 2D LVIDd:         3.67 cm     Diastology LVIDs:         2.57 cm     LV e' medial:    7.83 cm/s LV PW:         0.96 cm     LV E/e' medial:  13.5 LV IVS:        1.00 cm     LV e' lateral:   8.05 cm/s LVOT diam:     2.10 cm     LV E/e' lateral: 13.2 LV SV:         72 LV SV Index:   47 LVOT Area:     3.46 cm  LV Volumes (MOD) LV vol d, MOD A2C: 42.9 ml LV vol d, MOD A4C: 50.2 ml LV vol s, MOD A2C: 15.9 ml LV vol s, MOD A4C: 20.6 ml LV SV MOD A2C:     27.0 ml LV SV MOD A4C:     50.2 ml LV SV MOD BP:       27.4 ml RIGHT VENTRICLE            IVC RV S prime:     9.79 cm/s  IVC  diam: 1.58 cm TAPSE (M-mode): 1.8 cm LEFT ATRIUM             Index       RIGHT ATRIUM           Index LA diam:        3.40 cm 2.23 cm/m  RA Area:     10.40 cm LA Vol (A2C):   30.6 ml 20.04 ml/m RA Volume:   24.10 ml  15.78 ml/m LA Vol (A4C):   23.3 ml 15.26 ml/m LA Biplane Vol: 29.5 ml 19.32 ml/m  AORTIC VALVE LVOT Vmax:   112.00 cm/s LVOT Vmean:  69.900 cm/s LVOT VTI:    0.208 m  AORTA Ao Root diam: 3.00 cm MITRAL VALVE MV Area (PHT): 4.06 cm     SHUNTS MV Decel Time: 187 msec     Systemic VTI:  0.21 m MV E velocity: 106.00 cm/s  Systemic Diam: 2.10 cm MV A velocity: 107.00 cm/s MV E/A ratio:  0.99 Kirk Ruths MD Electronically signed by Kirk Ruths MD Signature Date/Time: 07/27/2020/11:29:30 AM    Final    VAS [place] LOWER EXTREMITY VENOUS (DVT)  Result Date: 07/28/2020  Lower Venous DVT Study Indications: Elevated Ddimer.  Risk Factors: None identified. Comparison Study: No prior studies. Performing Technologist: Oliver Hum RVT  Examination Guidelines: A complete evaluation includes B-mode imaging, spectral Doppler, color Doppler, and power Doppler as needed of all accessible portions of each vessel. Bilateral testing is considered an integral part of a complete examination. Limited examinations for reoccurring indications may be performed as noted. The reflux portion of the exam is performed with the patient in reverse Trendelenburg.  +---------+---------------+---------+-----------+----------+--------------+ RIGHT    CompressibilityPhasicitySpontaneityPropertiesThrombus Aging +---------+---------------+---------+-----------+----------+--------------+ CFV      Full           Yes      Yes                                 +---------+---------------+---------+-----------+----------+--------------+ SFJ      Full                                                         +---------+---------------+---------+-----------+----------+--------------+ FV Prox  Full                                                        +---------+---------------+---------+-----------+----------+--------------+ FV Mid   Full                                                        +---------+---------------+---------+-----------+----------+--------------+ FV DistalFull                                                        +---------+---------------+---------+-----------+----------+--------------+ PFV      Full                                                        +---------+---------------+---------+-----------+----------+--------------+  POP      Full           Yes      Yes                                 +---------+---------------+---------+-----------+----------+--------------+ PTV      Full                                                        +---------+---------------+---------+-----------+----------+--------------+ PERO     Full                                                        +---------+---------------+---------+-----------+----------+--------------+   +---------+---------------+---------+-----------+----------+--------------+ LEFT     CompressibilityPhasicitySpontaneityPropertiesThrombus Aging +---------+---------------+---------+-----------+----------+--------------+ CFV      Full           Yes      Yes                                 +---------+---------------+---------+-----------+----------+--------------+ SFJ      Full                                                        +---------+---------------+---------+-----------+----------+--------------+ FV Prox  Full                                                        +---------+---------------+---------+-----------+----------+--------------+ FV Mid   Full                                                         +---------+---------------+---------+-----------+----------+--------------+ FV DistalFull                                                        +---------+---------------+---------+-----------+----------+--------------+ PFV      Full                                                        +---------+---------------+---------+-----------+----------+--------------+ POP      Full           Yes      Yes                                 +---------+---------------+---------+-----------+----------+--------------+  PTV      Full                                                        +---------+---------------+---------+-----------+----------+--------------+ PERO     Full                                                        +---------+---------------+---------+-----------+----------+--------------+     Summary: RIGHT: - There is no evidence of deep vein thrombosis in the lower extremity.  - No cystic structure found in the popliteal fossa.  LEFT: - There is no evidence of deep vein thrombosis in the lower extremity.  - No cystic structure found in the popliteal fossa.  *See table(s) above for measurements and observations. Electronically signed by Jamelle Haring on 07/28/2020 at 1:45:40 PM.    Final     Microbiology: Recent Results (from the past 240 hour(s))  Resp Panel by RT-PCR (Flu A&B, Covid) Nasopharyngeal Swab     Status: None   Collection Time: 07/26/20  3:48 PM   Specimen: Nasopharyngeal Swab; Nasopharyngeal(NP) swabs in vial transport medium  Result Value Ref Range Status   SARS Coronavirus 2 by RT PCR NEGATIVE NEGATIVE Final    Comment: (NOTE) SARS-CoV-2 target nucleic acids are NOT DETECTED.  The SARS-CoV-2 RNA is generally detectable in upper respiratory specimens during the acute phase of infection. The lowest concentration of SARS-CoV-2 viral copies this assay can detect is 138 copies/mL. A negative result does not preclude SARS-Cov-2 infection and should not  be used as the sole basis for treatment or other patient management decisions. A negative result may occur with  improper specimen collection/handling, submission of specimen other than nasopharyngeal swab, presence of viral mutation(s) within the areas targeted by this assay, and inadequate number of viral copies(<138 copies/mL). A negative result must be combined with clinical observations, patient history, and epidemiological information. The expected result is Negative.  Fact Sheet for Patients:  EntrepreneurPulse.com.au  Fact Sheet for Healthcare Providers:  IncredibleEmployment.be  This test is no t yet approved or cleared by the [place] FDA and  has been authorized for detection and/or diagnosis of SARS-CoV-2 by FDA under an Emergency Use Authorization (EUA). This EUA will remain  in effect (meaning this test can be used) for the duration of the COVID-19 declaration under Section 564(b)(1) of the Act, 21 U.S.C.section 360bbb-3(b)(1), unless the authorization is terminated  or revoked sooner.       Influenza A by PCR NEGATIVE NEGATIVE Final   Influenza B by PCR NEGATIVE NEGATIVE Final    Comment: (NOTE) The Xpert Xpress SARS-CoV-2/FLU/RSV plus assay is intended as an aid in the diagnosis of influenza from Nasopharyngeal swab specimens and should not be used as a sole basis for treatment. Nasal washings and aspirates are unacceptable for Xpert Xpress SARS-CoV-2/FLU/RSV testing.  Fact Sheet for Patients: EntrepreneurPulse.com.au  Fact Sheet for Healthcare Providers: IncredibleEmployment.be  This test is not yet approved or cleared by the [place] FDA and has been authorized for detection and/or diagnosis of SARS-CoV-2 by FDA under an Emergency Use Authorization (EUA). This EUA will remain in effect (meaning this test can be used)  for the duration of the COVID-19 declaration under Section  564(b)(1) of the Act, 21 U.S.C. section 360bbb-3(b)(1), unless the authorization is terminated or revoked.  Performed at [facility], [place] [address], [place], [place] [address]   Urine Culture     Status: Abnormal   Collection Time: 07/26/20  5:05 PM   Specimen: Urine, Random  Result Value Ref Range Status   Specimen Description   Final    URINE, RANDOM Performed at [facility] [address], [place], [place] [address]    Special Requests   Final    NONE Performed at [facility], [place] [address] [place], [place] [address]    Culture MULTIPLE SPECIES PRESENT, SUGGEST RECOLLECTION (A)  Final   Report Status 07/28/2020 FINAL  Final  Blood culture (routine x 2)     Status: None   Collection Time: 07/26/20  5:12 PM   Specimen: BLOOD  Result Value Ref Range Status   Specimen Description   Final    BLOOD LEFT ANTECUBITAL Performed at [place] [address] [place], [place] [address]    Special Requests   Final    BOTTLES DRAWN AEROBIC AND ANAEROBIC Blood Culture adequate volume Performed at [place] [address] [place], [place] [address]    Culture   Final    NO GROWTH 5 DAYS Performed at [facility], [place] [address] [place], [place] [address]    Report Status 07/31/2020 FINAL  Final  Blood culture (routine x 2)     Status: None   Collection Time: 07/26/20  5:17 PM   Specimen: BLOOD LEFT FOREARM  Result Value Ref Range Status   Specimen Description   Final    BLOOD LEFT FOREARM Performed at [place] [address], [place], [place] [address]    Special Requests   Final    BOTTLES DRAWN AEROBIC AND ANAEROBIC Blood Culture results may not be optimal due to an inadequate volume of blood received in culture bottles   Culture   Final    NO GROWTH 5 DAYS Performed at [facility], [place] [address], [place], [place] [address]     Report Status 07/31/2020 FINAL  Final  Respiratory Panel by PCR     Status: None   Collection Time: 07/26/20  6:01 PM   Specimen: Nasopharyngeal Swab; Respiratory  Result Value Ref Range Status   Adenovirus NOT DETECTED NOT DETECTED Final   Coronavirus 229E NOT DETECTED NOT DETECTED Final    Comment: (NOTE) The Coronavirus on the Respiratory Panel, DOES NOT test for the novel  Coronavirus (2019 nCoV)    Coronavirus HKU1 NOT DETECTED NOT DETECTED Final   Coronavirus NL63 NOT DETECTED NOT DETECTED Final   Coronavirus OC43 NOT DETECTED NOT DETECTED Final   Metapneumovirus NOT DETECTED NOT DETECTED Final   Rhinovirus / Enterovirus NOT DETECTED NOT DETECTED Final   Influenza A NOT DETECTED NOT DETECTED Final   Influenza B NOT DETECTED NOT DETECTED Final   Parainfluenza Virus 1 NOT DETECTED NOT DETECTED Final   Parainfluenza Virus 2 NOT DETECTED NOT DETECTED Final   Parainfluenza Virus 3 NOT DETECTED NOT DETECTED Final   Parainfluenza Virus 4 NOT DETECTED NOT DETECTED Final   Respiratory Syncytial Virus NOT DETECTED NOT DETECTED Final   Bordetella pertussis NOT DETECTED NOT DETECTED Final   Chlamydophila pneumoniae NOT DETECTED NOT DETECTED Final   Mycoplasma pneumoniae NOT DETECTED NOT DETECTED  Final    Comment: Performed at [facility], [place] [address] [place], [place] [address]  MRSA PCR Screening     Status: None   Collection Time: 07/28/20  5:20 PM   Specimen: Nasal Mucosa; Nasopharyngeal  Result Value Ref Range Status   MRSA by PCR NEGATIVE NEGATIVE Final    Comment:        The GeneXpert MRSA Assay (FDA approved for NASAL specimens only), is one component of a comprehensive MRSA colonization surveillance program. It is not intended to diagnose MRSA infection nor to guide or monitor treatment for MRSA infections. Performed at [facility], [place] [address], [place], [place] [address]   Resp Panel by RT-PCR (Flu A&B, Covid) Nasopharyngeal Swab      Status: None   Collection Time: 08/02/20  3:52 AM   Specimen: Nasopharyngeal Swab; Nasopharyngeal(NP) swabs in vial transport medium  Result Value Ref Range Status   SARS Coronavirus 2 by RT PCR NEGATIVE NEGATIVE Final    Comment: (NOTE) SARS-CoV-2 target nucleic acids are NOT DETECTED.  The SARS-CoV-2 RNA is generally detectable in upper respiratory specimens during the acute phase of infection. The lowest concentration of SARS-CoV-2 viral copies this assay can detect is 138 copies/mL. A negative result does not preclude SARS-Cov-2 infection and should not be used as the sole basis for treatment or other patient management decisions. A negative result may occur with  improper specimen collection/handling, submission of specimen other than nasopharyngeal swab, presence of viral mutation(s) within the areas targeted by this assay, and inadequate number of viral copies(<138 copies/mL). A negative result must be combined with clinical observations, patient history, and epidemiological information. The expected result is Negative.  Fact Sheet for Patients:  EntrepreneurPulse.com.au  Fact Sheet for Healthcare Providers:  IncredibleEmployment.be  This test is no t yet approved or cleared by the [place] FDA and  has been authorized for detection and/or diagnosis of SARS-CoV-2 by FDA under an Emergency Use Authorization (EUA). This EUA will remain  in effect (meaning this test can be used) for the duration of the COVID-19 declaration under Section 564(b)(1) of the Act, 21 U.S.C.section 360bbb-3(b)(1), unless the authorization is terminated  or revoked sooner.       Influenza A by PCR NEGATIVE NEGATIVE Final   Influenza B by PCR NEGATIVE NEGATIVE Final    Comment: (NOTE) The Xpert Xpress SARS-CoV-2/FLU/RSV plus assay is intended as an aid in the diagnosis of influenza from Nasopharyngeal swab specimens and should not be used as a sole basis  for treatment. Nasal washings and aspirates are unacceptable for Xpert Xpress SARS-CoV-2/FLU/RSV testing.  Fact Sheet for Patients: EntrepreneurPulse.com.au  Fact Sheet for Healthcare Providers: IncredibleEmployment.be  This test is not yet approved or cleared by the [place] FDA and has been authorized for detection and/or diagnosis of SARS-CoV-2 by FDA under an Emergency Use Authorization (EUA). This EUA will remain in effect (meaning this test can be used) for the duration of the COVID-19 declaration under Section 564(b)(1) of the Act, 21 U.S.C. section 360bbb-3(b)(1), unless the authorization is terminated or revoked.  Performed at [facility], [place] [address] [place], [place] [address]      Labs: Basic Metabolic Panel: Recent Labs  Lab 07/28/20 [phone] 07/29/20 0723 07/30/20 0634 08/02/20 0015 08/02/20 0616 08/03/20 0710  NA 137 138 140 138  --  141  K 4.0 3.6 3.7 3.7  --  3.7  CL 106 103 107 103  --  105  CO2 21* 23 24 21*  --  25  GLUCOSE 106* 114* 101* 147*  --  99  BUN <5* 7* 7* 19  --  9  CREATININE 0.67 0.57 0.64 0.99 0.82 0.77  CALCIUM 8.2* 8.3* 8.6* 9.1  --  9.2  MG 1.9 2.0 1.9 1.7  --   --   PHOS 1.8* 2.4* 2.5  --   --   --    Liver Function Tests: Recent Labs  Lab 07/28/20 0607 07/29/20 0723 07/30/20 0634 08/02/20 0015 08/03/20 0710  AST 19 18  --  130* 58*  ALT 16 15  --  63* 51*  ALKPHOS 60 64  --  61 53  BILITOT 0.5 0.3  --  0.6 0.3  PROT 7.0 7.0  --  7.6 7.7  ALBUMIN 3.0* 2.9* 3.0* 3.2* 3.1*   No results for input(s): LIPASE, AMYLASE in the last 168 hours. No results for input(s): AMMONIA in the last 168 hours. CBC: Recent Labs  Lab 07/28/20 0607 07/29/20 0723 07/30/20 0634 08/02/20 0015 08/02/20 0616 08/03/20 0710  WBC 15.5* 10.6* 8.3 14.5* 12.2* 8.4  NEUTROABS 11.4* 6.5 4.6 11.7*  --   --   HGB 12.4 11.9* 12.0 12.9 12.2 12.8  HCT 37.2 35.5* 35.9* 38.1 36.0 36.9  MCV 92.3 91.0  91.8 91.6 91.6 90.4  PLT 305 304 346 399 392 380   Cardiac Enzymes: No results for input(s): CKTOTAL, CKMB, CKMBINDEX, TROPONINI in the last 168 hours. BNP: BNP (last 3 results) No results for input(s): BNP in the last 8760 hours.  ProBNP (last 3 results) No results for input(s): PROBNP in the last 8760 hours.  CBG: Recent Labs  Lab 07/28/20 0749 07/29/20 0745 07/31/20 0612 07/31/20 0821 08/02/20 0041  GLUCAP 107* 117* 98 108* 146*       Signed:  Neil Errickson  [facility] 08/03/2020, 11:58 AM

## 2020-08-03 NOTE — Progress Notes (Signed)
Patient refused her breakfast this morning and also refused her morning medication twice. Pt stated " I don't want that" Importance of medication was explained to patient and continue to refused. MD aware.  Bed in lowest position. Call bell within reach. Floor mats in place. Bed alarm on.  Will continue to monitor.

## 2020-08-03 NOTE — Care Management Obs Status (Signed)
[facility] NOTIFICATION   Patient Details  Name: Melinda [place] MRN: [id number] Date of Birth: 07/23/1941   Medicare Observation Status Notification Given:   Yes    Bethann Berkshire, LCSW 08/03/2020, 1:34 PM

## 2020-08-03 NOTE — Discharge Instructions (Signed)
Seizure, Adult A seizure is a sudden burst of abnormal electrical activity in the brain. Seizures usually last from 30 seconds to 2 minutes. They can cause many different symptoms. Usually, seizures are not harmful unless they last a long time. What are the causes? Common causes of this condition include:  Fever or infection.  Conditions that affect the brain, such as: ? A brain abnormality that you were born with. ? A brain or head injury. ? Bleeding in the brain. ? A tumor. ? Stroke. ? Brain disorders such as autism or cerebral palsy.  Low blood sugar.  Conditions that are passed from parent to child (are inherited).  Problems with substances, such as: ? Having a reaction to a drug or a medicine. ? Suddenly stopping the use of a substance (withdrawal). In some cases, the cause may not be known. A person who has repeated seizures over time without a clear cause has a condition called epilepsy. What increases the risk? You are more likely to get this condition if you have:  A family history of epilepsy.  Had a seizure in the past.  A brain disorder.  A history of head injury, lack of oxygen at birth, or strokes. What are the signs or symptoms? There are many types of seizures. The symptoms vary depending on the type of seizure you have. Examples of symptoms during a seizure include:  Shaking (convulsions).  Stiffness in the body.  Passing out (losing consciousness).  Head nodding.  Staring.  Not responding to sound or touch.  Loss of bladder control and bowel control. Some people have symptoms right before and right after a seizure happens. Symptoms before a seizure may include:  Fear.  Worry (anxiety).  Feeling like you may vomit (nauseous).  Feeling like the room is spinning (vertigo).  Feeling like you saw or heard something before (dj vu).  Odd tastes or smells.  Changes in how you see. You may see flashing lights or spots. Symptoms after a  seizure happens can include:  Confusion.  Sleepiness.  Headache.  Weakness on one side of the body. How is this treated? Most seizures will stop on their own in under 5 minutes. In these cases, no treatment is needed. Seizures that last longer than 5 minutes will usually need treatment. Treatment can include:  Medicines given through an IV tube.  Avoiding things that are known to cause your seizures. These can include medicines that you take for another condition.  Medicines to treat epilepsy.  Surgery to stop the seizures. This may be needed if medicines do not help. Follow these instructions at home: Medicines  Take over-the-counter and prescription medicines only as told by your doctor.  Do not eat or drink anything that may keep your medicine from working, such as alcohol. Activity  Do not do any activities that would be dangerous if you had another seizure, like driving or swimming. Wait until your doctor says it is safe for you to do them.  If you live in the [place] ask your local DMV (department of motor vehicles) when you can drive.  Get plenty of rest. Teaching others Teach friends and family what to do when you have a seizure. They should:  Lay you on the ground.  Protect your head and body.  Loosen any tight clothing around your neck.  Turn you on your side.  Not hold you down.  Not put anything into your mouth.  Know whether or not you need emergency care.  Stay   with you until you are better.  General instructions  Contact your doctor each time you have a seizure.  Avoid anything that gives you seizures.  Keep a seizure diary. Write down: ? What you think caused each seizure. ? What you remember about each seizure.  Keep all follow-up visits as told by your doctor. This is important. Contact a doctor if:  You have another seizure.  You have seizures more often.  There is any change in what happens during your seizures.  You keep having  seizures with treatment.  You have symptoms of being sick or having an infection. Get help right away if:  You have a seizure that: ? Lasts longer than 5 minutes. ? Is different than seizures you had before. ? Makes it harder to breathe. ? Happens after you hurt your head.  You have any of these symptoms after a seizure: ? Not being able to speak. ? Not being able to use a part of your body. ? Confusion. ? A bad headache.  You have two or more seizures in a row.  You do not wake up right after a seizure.  You get hurt during a seizure. These symptoms may be an emergency. Do not wait to see if the symptoms will go away. Get medical help right away. Call your local emergency services (911 in the [place] Do not drive yourself to the hospital. Summary  Seizures usually last from 30 seconds to 2 minutes. Usually, they are not harmful unless they last a long time.  Do not eat or drink anything that may keep your medicine from working, such as alcohol.  Teach friends and family what to do when you have a seizure.  Contact your doctor each time you have a seizure. This information is not intended to replace advice given to you by your health care provider. Make sure you discuss any questions you have with your health care provider. Document Revised: 10/30/2018 Document Reviewed: 10/30/2018 Elsevier Patient Education  [employer].  Per [employer] statutes, patients with seizures are not allowed to drive until they have been seizure-free for six months. Use caution when using heavy equipment or power tools. Avoid working on ladders or at heights. Take showers instead of baths. Ensure the water temperature is not too high on the home water heater. Do not go swimming alone. When caring for infants or small children, sit down when holding, feeding, or changing them to minimize risk of injury to the child in the event you have a seizure.

## 2020-08-03 NOTE — Evaluation (Signed)
Physical Therapy Evaluation Patient Details Name: Melinda Griffin MRN: [id number] DOB: 04-25-1941 Today's Date: 08/03/2020   History of Present Illness  79yo female brought from home to the ED after possible seizure activity and related fall at home. CTH, MRI, and cervical imaging all negative for acute injury/changes. PMH anxiety, HLD, migrane with aura, dementia  Clinical Impression   Patient received in bed, lethargic and very sleepy today. Able to wake her and she tells me she will do 2 things with me, however after blankets were removed she refuses and when challenged tells me she said that yesterday. Only states "yup" and "uh-huh" to A&O questions, unclear if she is unable/confused or unwilling to give details. Seems very strong- able to physically resist attempts at mobility. Eventually able to convince her to roll side to side in bed under hot blankets, which she was able to do with extended time/increased effort and min guard. Per RN and chart, daughter is having a hard time caring for her at home. Left in bed with all needs met, bed alarm active. Would likely benefit from transition to SNF at this point.     Follow Up Recommendations SNF;Supervision/Assistance - 24 hour    Equipment Recommendations  None recommended by PT    Recommendations for Other Services       Precautions / Restrictions Precautions Precautions: Fall Restrictions Weight Bearing Restrictions: No      Mobility  Bed Mobility Overal bed mobility: Needs Assistance Bed Mobility: Rolling Rolling: Min guard         General bed mobility comments: fluctuates due to cognition and her motivation to actually participate in an activity- able to physically resist me trying to help her, then at EOS rolled over on her back by her own power    Transfers                 General transfer comment: physically resisted  Ambulation/Gait             General Gait Details: physically  resisted/refused  Stairs            Wheelchair Mobility    Modified Rankin (Stroke Patients Only)       Balance                                             Pertinent Vitals/Pain Pain Assessment: Faces Faces Pain Scale: No hurt Pain Intervention(s): Limited activity within patient's tolerance;Monitored during session    Home Living Family/patient expects to be discharged to:: Private residence Living Arrangements: Children Available Help at Discharge: Family Type of Home: [place]: One level Home Equipment: None Additional Comments: patient poor historian/did not answer questions today- all info taken from prior charting    Prior Function Level of Independence: Independent         Comments: per DTR patient completes her dressing, utilizes walk in tub for bathing, does her own toileting without AD. goes to adult day care program 5 days a week      Hand Dominance        Extremity/Trunk Assessment   Upper Extremity Assessment Upper Extremity Assessment: Difficult to assess due to impaired cognition    Lower Extremity Assessment Lower Extremity Assessment: Difficult to assess due to impaired cognition    Cervical / Trunk Assessment Cervical / Trunk Assessment: Normal  Communication   Communication: Expressive difficulties;Other (comment) (hx of expressive difficulites)  Cognition Arousal/Alertness: Lethargic Behavior During Therapy: Flat affect Overall Cognitive Status: History of cognitive impairments - at baseline                                 General Comments: hx of dementia- tells me she will do 2 things with me, then 5 minutes later tries to refuse and tells me she said that yesterday. Perseverates on being cold. Only states "yup" or "uh-huh" to A&O questions.      General Comments General comments (skin integrity, edema, etc.): unable to get to EOB/OOB today due to physical resistance     Exercises     Assessment/Plan    PT Assessment Patient needs continued PT services  PT Problem List Decreased cognition;Decreased safety awareness;Decreased balance;Decreased mobility       PT Treatment Interventions DME instruction;Balance training;Gait training;Stair training;Cognitive remediation;Functional mobility training;Patient/family education;Therapeutic activities;Therapeutic exercise    PT Goals (Current goals can be found in the Care Plan section)  Acute Rehab PT Goals Patient Stated Goal: to not be cold PT Goal Formulation: With patient Time For Goal Achievement: 08/17/20 Potential to Achieve Goals: Fair    Frequency Min 2X/week   Barriers to discharge        Co-evaluation               AM-PAC PT "6 Clicks" Mobility  Outcome Measure Help needed turning from your back to your side while in a flat bed without using bedrails?: A Little Help needed moving from lying on your back to sitting on the side of a flat bed without using bedrails?: A Little Help needed moving to and from a bed to a chair (including a wheelchair)?: A Little Help needed standing up from a chair using your arms (e.g., wheelchair or bedside chair)?: A Little Help needed to walk in hospital room?: A Little Help needed climbing 3-5 steps with a railing? : A Lot 6 Click Score: 17    End of Session   Activity Tolerance: Patient limited by lethargy Patient left: in bed;with call bell/phone within reach;with bed alarm set Nurse Communication: Mobility status PT Visit Diagnosis: History of falling (Z91.81);Other abnormalities of gait and mobility (R26.89)    Time: 5110-2111 PT Time Calculation (min) (ACUTE ONLY): 9 min   Charges:   PT Evaluation $PT Eval Moderate Complexity: 1 Mod          Windell Norfolk, DPT, PN1   Supplemental Physical Therapist [place]    Pager [phone] Acute Rehab Office [phone]

## 2020-08-03 NOTE — Progress Notes (Addendum)
Subjective: No seizures overnight. Patient was refusing Keppra PO this morning so I switched her to IV.  ROS: Unable to obtain due to poor mental status  Examination  Vital signs in last 24 hours: Temp:  [97.5 F (36.4 C)-98.4 F (36.9 C)] 97.5 F (36.4 C) (12/09 0616) Pulse Rate:  [69-105] 81 (12/08 1936) Resp:  [14-24] 14 (12/09 0616) BP: (103-125)/(49-74) 113/74 (12/09 0616) SpO2:  [93 %-95 %] 95 % (12/09 0616) Weight:  [52.2 kg] 52.2 kg (12/09 0616)  General: lying in bed, NAD CVS: pulse-normal rate and rhythm RS: breathing comfortably, coarse breath sounds BL Extremities: normal, warm  Neuro: MS: Alert, oriented to self only, not to place and time, follows commands, requesting to sleep CN: pupils equal and reactive,  EOMI, face symmetric, tongue midline, normal sensation over face Motor: antigravity strength in all 4 extremities  Basic Metabolic Panel: Recent Labs  Lab 07/28/20 0607 07/29/20 0723 07/30/20 0634 08/02/20 0015 08/02/20 0616 08/03/20 0710  NA 137 138 140 138  --  141  K 4.0 3.6 3.7 3.7  --  3.7  CL 106 103 107 103  --  105  CO2 21* 23 24 21*  --  25  GLUCOSE 106* 114* 101* 147*  --  99  BUN <5* 7* 7* 19  --  9  CREATININE 0.67 0.57 0.64 0.99 0.82 0.77  CALCIUM 8.2* 8.3* 8.6* 9.1  --  9.2  MG 1.9 2.0 1.9 1.7  --   --   PHOS 1.8* 2.4* 2.5  --   --   --     CBC: Recent Labs  Lab 07/28/20 0607 07/29/20 0723 07/30/20 0634 08/02/20 0015 08/02/20 0616 08/03/20 0710  WBC 15.5* 10.6* 8.3 14.5* 12.2* 8.4  NEUTROABS 11.4* 6.5 4.6 11.7*  --   --   HGB 12.4 11.9* 12.0 12.9 12.2 12.8  HCT 37.2 35.5* 35.9* 38.1 36.0 36.9  MCV 92.3 91.0 91.8 91.6 91.6 90.4  PLT 305 304 346 399 392 380     Coagulation Studies: No results for input(s): LABPROT, INR in the last 72 hours.  Imaging No new brain imaging overnight.   ASSESSMENT AND PLAN: 79yo F with Alzheimer's dementia and multiple seizure like episodes.  Seizure Alzheimer's dementia   Leucocytosis ( improving) Hypoalbuminemia Transaminitis -Patient has a diagnosis of Alzheimer's dementia which increases the risk for epilepsy.  She has also had possibly more than 1 seizure-like episodes.   -Leukocytosis likely secondary to seizure.   Recommendations - Discussed with patient's daughter who would prefer for patient to be on antiepileptic medications. Also discussed that patient has been refusing to take medications in the hospital but daughter states that it has not been an issue at home and is confident that patient will be compliant with her medications. - Continue Keppra 250 mg twice daily. Did discuss with daughter that if medication compliance becomes an issue, please notify neurology so we can possibly switch her to a longer acting medication like zonisamide or Onfi -discussed seizure precautions, patient does not drive - PRN IV versed 2 mg for seizures lasting 1 1 5  minutes - Management of rest of comorbidities per primary team - Follow-up with neurology in 6 to 8 weeks after discharge, has neurologist at [facility]  Seizure precautions: Per [facility] statutes, patients with seizures are not allowed to drive until they have been seizure-free for six months and cleared by a physician    Use caution when using heavy equipment or power tools. Avoid  working on Academic librarian or at [employer]. Take showers instead of baths. Ensure the water temperature is not too high on the home water heater. Do not go swimming alone. Do not lock yourself in a room alone (i.e. bathroom). When caring for infants or small children, sit down when holding, feeding, or changing them to minimize risk of injury to the child in the event you have a seizure. Maintain good sleep hygiene. Avoid alcohol.    If patient has another seizure, call 911 and bring them back to the ED if: A.  The seizure lasts longer than 5 minutes.      B.  The patient doesn't wake shortly after the seizure or has new problems such  as difficulty seeing, speaking or moving following the seizure C.  The patient was injured during the seizure D.  The patient has a temperature over 102 F (39C) E.  The patient vomited during the seizure and now is having trouble breathing    During the Seizure   - First, ensure adequate ventilation and place patients on the floor on their left side  Loosen clothing around the neck and ensure the airway is patent. If the patient is clenching the teeth, do not force the mouth open with any object as this can cause severe damage - Remove all items from the surrounding that can be hazardous. The patient may be oblivious to what's happening and may not even know what he or she is doing. If the patient is confused and wandering, either gently guide him/her away and block access to outside areas - Reassure the individual and be comforting - Call 911. In most cases, the seizure ends before EMS arrives. However, there are cases when seizures may last over 3 to 5 minutes. Or the individual may have developed breathing difficulties or severe injuries. If a pregnant patient or a person with diabetes develops a seizure, it is prudent to call an ambulance. - Finally, if the patient does not regain full consciousness, then call EMS. Most patients will remain confused for about 45 to 90 minutes after a seizure, so you must use judgment in calling for help. - Avoid restraints but make sure the patient is in a bed with padded side rails - Place the individual in a lateral position with the neck slightly flexed; this will help the saliva drain from the mouth and prevent the tongue from falling backward - Remove all nearby furniture and other hazards from the area - Provide verbal assurance as the individual is regaining consciousness - Provide the patient with privacy if possible - Call for help and start treatment as ordered by the caregiver    After the Seizure (Postictal Stage)   After a seizure, most  patients experience confusion, fatigue, muscle pain and/or a headache. Thus, one should permit the individual to sleep. For the next few days, reassurance is essential. Being calm and helping reorient the person is also of importance.   Most seizures are painless and end spontaneously. Seizures are not harmful to others but can lead to complications such as stress on the lungs, brain and the heart. Individuals with prior lung problems may develop labored breathing and respiratory distress.    I have spent a total of  35 minutes with the patient reviewing hospital notes,  test results, labs and examining the patient as well as establishing an assessment and plan that was discussed personally with the patient's daughter.  > 50% of time was spent in direct patient  care.       Zeb Comfort Epilepsy [facility] For questions after 5pm please refer to AMION to reach the Neurologist on call

## 2020-08-03 NOTE — TOC Transition Note (Addendum)
Transition of Care [name]) - CM/SW Discharge Note   Patient Details  Name: Melinda Griffin MRN: [id number] Date of Birth: 1941/05/05  [facility] CM/SW Contact:  Zenon Mayo, RN Phone Number: 08/03/2020, 1:06 PM   Clinical Narrative:    Patient is for dc today, NCM spoke with daughter , [name], she is ok with [facility] for [facility] services.  Soc will begin 24 to 48 hrs post dc.  Daughter lives with patient and has been taking care of her for a while.  Per Daughter she does not need ambulance transport and will transport patient by car, states she saw patient ambulating yesterday when she was here putting her clothes on. Daughter states they have the 3 n 1 over the toilet at home already. Daughter states [facility] is working with them for outpatient palliative services. NCM contacted Chrislyn to confirm and informed her patient is for dc today.   Final next level of care: [place]  Barriers to Discharge: No Barriers Identified   Patient Goals and CMS Choice Patient states their goals for this hospitalization and ongoing recovery are:: home with [facility] CMS Medicare.gov Compare Post Acute Care list provided to:: Patient Represenative (must comment) Choice offered to / list presented to : Adult Children  Discharge Placement                       Discharge Plan and Services                DME Arranged: 3-N-1 DME Agency: [employer] Date DME Agency Contacted: 08/02/20 Time DME Agency Contacted: 1000 Representative spoke with at DME Agency: [employer] rep HH Arranged: RN,PT,OT,Nurse's [place] Work [employer] Agency: [place] Date [place]: 08/03/20 Time [place]: [place] Representative spoke with at [place]: [place] Determinants of Health ([place]) Interventions     Readmission Risk Interventions No flowsheet data found.

## 2020-08-03 NOTE — Progress Notes (Signed)
D/C instructions given and reviewed with daughter. Questions asked and answered. Obtained DNR yellow form from MD for daughter. IV removed, tolerated well.

## 2020-08-03 NOTE — TOC Progression Note (Addendum)
Transition of Care [facility]) - Progression Note    Patient Details  Name: Melinda Griffin MRN: [id number] Date of Birth: 1940-10-06  Transition of Care [facility]) CM/SW Contact  Zenon Mayo, RN Phone Number: 08/03/2020, 8:45 AM  Clinical Narrative:    NCM made referral to [facility] with Alvis Lemmings he is able to take referral, NCM  Informed [name] this information, since previous agency was not able to take patient .  Will need orders for [place], [place], [place] , [place].  Soc will begin 24 to 48 hrs post dc. Per MD , daughter wants SNF now, NCM informed Tommi Rumps to cancel referral.         Expected Discharge Plan and Services                                                 Social Determinants of Health (SDOH) Interventions    Readmission Risk Interventions No flowsheet data found.

## 2020-08-03 NOTE — Plan of Care (Signed)

## 2020-08-04 ENCOUNTER — Telehealth: Payer: Self-pay

## 2020-08-04 NOTE — Telephone Encounter (Signed)
Spoke with patient's [name] and scheduled an in-person Palliative Consult for 08/21/20 @ 3PM   COVID screening was negative. No pets in home. Patient lives with [name]   Consent obtained; updated Outlook/Netsmart/Team List and Epic.

## 2020-08-04 NOTE — Telephone Encounter (Signed)
Attempted to call Patients [name] (Okay-per DPR), Per MyChart message encounter. Left message to return call to the office when able.

## 2020-08-07 NOTE — Addendum Note (Signed)
Addended by: Len Blalock on: 08/07/2020 03:17 PM   Modules accepted: Orders

## 2020-08-10 NOTE — Telephone Encounter (Signed)
Please refer to [place].

## 2020-08-11 ENCOUNTER — Ambulatory Visit (HOSPITAL_COMMUNITY): Payer: Medicare Other

## 2020-08-18 NOTE — ED Provider Notes (Signed)
[facility] Emergency Department Provider Note MRN:  [id number]  Arrival date & time: 08/18/20     Chief Complaint   Fall and Head Laceration   History of Present Illness   Melinda Griffin is a 79 y.o. year-old female with a history of dementia presenting to the ED with chief complaint of fall.  History of advanced dementia, fall at care facility.  I was unable to obtain an accurate HPI, PMH, or ROS due to the patient's dementia.  Level 5 caveat.  Review of Systems  Positive for fall.  Patient's Health History    Past Medical History:  Diagnosis Date  . Allergic rhinitis   . Anxiety   . Asthma   . Hyperlipidemia   . Migraine with aura, without mention of intractable migraine without mention of status migrainosus     Past Surgical History:  Procedure Laterality Date  . APPENDECTOMY  1975  . BREAST BIOPSY Left 1976  . TUBAL LIGATION Bilateral 1976    Family History  Problem Relation Age of Onset  . Asthma Father   . Asthma Sister   . Heart disease Sister   . Cancer Mother        brain, breast  . Cancer Brother 20  . Heart disease Brother   . Hypertension Brother     Social History   Socioeconomic History  . Marital status: Divorced    Spouse name: [name]  . Number of children: 2  . Years of education: college   . Highest education level: Not on file  Occupational History  . Not on file  Tobacco Use  . Smoking status: Never Smoker  . Smokeless tobacco: Never Used  . Tobacco comment: smoked  briefly in early 60's less than a year  Vaping Use  . Vaping Use: Never used  Substance and Sexual Activity  . Alcohol use: No    Alcohol/week: 0.0 standard drinks  . Drug use: No  . Sexual activity: Not on file  Other Topics Concern  . 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 She  previously lived in [place]. Previously has traveled to [place], [place], [place], [place], [place], [place], [place], [place], [place], [place], & [place]. She has traveled all the way up the [facility] to [place]. Hasn't traveled much in the [place] except for [place]. Prior travel to [place] & [place]. She currently has 2 dogs & 1 cat. No bird exposure. Previously worked as a Freight forwarder in [employer]. No mold or hot tub exposure. She does have a swimming pool & coy pond.    Social Determinants of Health   Financial Resource Strain: Not on file  Food Insecurity: Not on file  Transportation Needs: Not on file  Physical Activity: Not on file  Stress: Not on file  Social Connections: Not on file  Intimate Partner Violence: Not on file     Physical Exam   Vitals:   08/01/20 1830 08/01/20 2147  BP: (!) 150/70 138/72  Pulse: 88 81  Resp: 16 20  Temp:    SpO2: 97% 94%    CONSTITUTIONAL: Chronically ill-appearing, NAD NEURO: Awake, not oriented to person place or time, moves all extremities EYES:  eyes equal and reactive ENT/NECK:  no LAD, no JVD CARDIO: Regular  rate, well-perfused, normal S1 and S2 PULM:  CTAB no wheezing or rhonchi GI/GU:  normal bowel sounds, non-distended, non-tender MSK/SPINE:  No gross deformities, no edema SKIN: Hematoma to occipital scalp PSYCH:  Appropriate speech and behavior  *Additional and/or pertinent findings included in MDM below  Diagnostic and Interventional Summary    EKG Interpretation  Date/Time:    Ventricular Rate:    PR Interval:    QRS Duration:   QT Interval:    QTC Calculation:   R Axis:     Text Interpretation:        Labs Reviewed - No data to display  CT HEAD WO CONTRAST  Final Result    CT CERVICAL SPINE WO CONTRAST  Final Result      Medications  acetaminophen (TYLENOL) tablet 1,000 mg (1,000 mg Oral Given 08/01/20 1749)     Procedures  /  Critical Care Procedures  ED Course and Medical Decision Making  I have reviewed the triage vital signs, the  nursing notes, and pertinent available records from the EMR.  Listed above are laboratory and imaging tests that I personally ordered, reviewed, and interpreted and then considered in my medical decision making (see below for details).  Mechanical fall, awaiting imaging.  Daughter with concerns about care facility.  May need case management.  Signed out to oncoming provider.       Elmer Sow. Pilar Plate, MD [facility] Emergency Medicine [facility] [email or web address]  Final Clinical Impressions(s) / ED Diagnoses     ICD-10-CM   1. Fall, initial encounter  W19.[facility]        Discharge Instructions Discussed with and Provided to Patient:   Discharge Instructions   None       Sabas Sous, MD 08/18/20 1513

## 2020-08-21 ENCOUNTER — Other Ambulatory Visit: Payer: Medicare Other | Admitting: Hospice

## 2020-08-21 ENCOUNTER — Other Ambulatory Visit: Payer: Self-pay

## 2020-08-21 DIAGNOSIS — F0391 Unspecified dementia with behavioral disturbance: Secondary | ICD-10-CM

## 2020-08-21 DIAGNOSIS — Z515 Encounter for palliative care: Secondary | ICD-10-CM

## 2020-08-21 NOTE — Progress Notes (Signed)
PATIENT NAME: Melinda Griffin [place] [address] [phone] (home)  DOB: November 09, 1940 MRN: [id number]  PRIMARY CARE PROVIDER:    Seward Carol, MD,  [place] [address] [place] [address] [phone]  REFERRING PROVIDER:   Dr. Leeroy Cha  RESPONSIBLE PARTY:   Extended Emergency Contact Information Primary Emergency Contact: Liccione,[name] Address: [address]          [place], [place] [address] Johnnette Litter of [place] Phone: [phone] Mobile Phone: [phone] Relation: Daughter  I met face to face with patient and family in home/facility.  ADVANCE CARE PLANNING/RECOMMENDATIONS/PLAN:    Visit at the request of Dr. Leeroy Cha   for palliative consult. Visit consisted of building trust and discussions on Palliative Medicine as specialized medical care for people living with serious illness, aimed at facilitating better quality of life through symptoms relief, assisting with advance care plan and establishing complex decision making.   Discussion on the difference between Palliative and Hospice care. Palliative care and hospice have similar goals of managing symptoms, promoting comfort, improving quality of life, and maintaining a person's dignity. However, palliative care may be offered during any phase of a serious illness, while hospice care is usually offered when a person is expected to live for 6 months or less.  Visit consisted of counseling and education dealing with the complex and emotionally intense issues of symptom management and palliative care in the setting of serious and potentially life-threatening illness. Darrick Meigs faith and family/friends support network are sources of strength for patient and family. Palliative care team will continue to support patient, patient's family, and medical team.  Advance Care Planning: Our advance care planning conversation included a discussion about:    The  value and importance of advance care planning  Exploration of goals of care in the event of a sudden injury or illness  Identification and preparation of a healthcare agent          Review and updating or creation of an advance directive document         CODE STATUS: Ramifications and implications of CODE STATUS discussed.  Patient is a DO NOT RESUSCITATE.  Signed DNR is in epic and at home.  GOALS OF CARE: Goals of care include to maximize quality of life and symptom management.  Follow up Palliative Care Visit: Palliative care will continue to follow for goals of care clarification and symptom management.   Symptom Management:  Memory loss/confusion: Related to dementia, ongoing.  Sundowning and agitation effectively managed with Zyprexa.  Continue Zyprexa and melatonin as ordered. Seizures: No report of seizures since started on Keppra.  Continue Keppra as ordered.  Seizure precautions discussed.  Follow-up appointment with neurologist as planned. Patient in no respiratory distress, no wheezing no dyspnea no shortness of breath.  Patient no longer on oxygen.  Continue Pulmicort as ordered. Appetite has improved since started on mirtazapine.  Continue mirtazapine 50 mg tablet as ordered.  No complaint of dysphagia at this time. Education provided on the progressive nature of dementia. Palliative will continue to monitor for symptom management/decline and make recommendations as needed. Family /Caregiver/Community Supports: Patient lives at home with her [name] and grandchildren strong family support system identified.  Patient also goes to [facility] for a day program  I spent 1 hour and 46 minutes providing this initial consultation; time includes time spent with patient/family, chart review, provider coordination, and documentation. More than 50% of the time in this consultation was spent  on counseling patient and coordinating communication.   CHIEF COMPLAIN/HISTORY OF PRESENT  ILLNESS:  Melinda Griffin is a 79 y.o. female with multiple medical problems including Dementia, Seizure, HTN. History obtained from review of EMR, discussion with [name] and interview with patient.   Palliative Care was asked to follow this patient by consultation request of Dr Leeroy Cha to help address advance care planning and complex decision making. Thank you for the opportunity to participate in the care of [place]: DNR  PPS: 50%  HOSPICE ELIGIBILITY/DIAGNOSIS: TBD  PAST MEDICAL HISTORY:  Past Medical History:  Diagnosis Date   Allergic rhinitis    Anxiety    Asthma    Hyperlipidemia    Migraine with aura, without mention of intractable migraine without mention of status migrainosus     SOCIAL HX:  Social History   Tobacco Use   Smoking status: Never Smoker   Smokeless tobacco: Never Used   Tobacco comment: smoked  briefly in early 60's less than a year  Substance Use Topics   Alcohol use: No    Alcohol/week: 0.0 standard drinks   FAMILY HX:  Family History  Problem Relation Age of Onset   Asthma Father    Asthma Sister    Heart disease Sister    Cancer Mother        brain, breast   Cancer Brother 39   Heart disease Brother    Hypertension Brother      Labs:   No results for input(s): WBC, HGB, HCT, PLT, MCV in the last 168 hours. No results for input(s): NA, K, CL, CO2, BUN, CREATININE, GLUCOSE in the last 168 hours.  ALLERGIES:  Allergies  Allergen Reactions   Shrimp [Shellfish Allergy] Anaphylaxis   Aspirin     Advised by MD previously not to take   Ceftin     Dizzy & Diarrhea   Ciprofloxacin Hcl     Dizzy, diarrhea   Ativan [Lorazepam] Anxiety    Agitation - tolerates Zyprexa   Haldol [Haloperidol] Anxiety    Agitation - tolerates Zyprexa     PERTINENT MEDICATIONS:  Outpatient Encounter Medications as of 08/21/2020  Medication Sig   albuterol (PROVENTIL) (2.5 MG/3ML)  0.083% nebulizer solution Take 3 mLs (2.5 mg total) by nebulization every 6 (six) hours as needed for wheezing or shortness of breath.   albuterol (VENTOLIN HFA) 108 (90 Base) MCG/ACT inhaler Inhale 1-2 puffs into the lungs every 6 (six) hours as needed for wheezing or shortness of breath.    budesonide (PULMICORT) 0.25 MG/2ML nebulizer solution Take 2 mLs (0.25 mg total) by nebulization 2 (two) times daily. Partial refill ok   Calcium Carb-Cholecalciferol (CALCIUM 500 + D) 500-200 MG-UNIT TABS Take 1 tablet by mouth daily.   cetirizine (ZYRTEC) 10 MG tablet Take 10 mg by mouth daily.    citalopram (CELEXA) 20 MG tablet Take 1 tablet (20 mg total) by mouth daily.   Cyanocobalamin (VITAMIN B-12 PO) Take 1 tablet by mouth daily.   levETIRAcetam (KEPPRA) 250 MG tablet Take 1 tablet (250 mg total) by mouth 2 (two) times daily.   lovastatin (MEVACOR) 40 MG tablet Take 1 tablet (40 mg total) by mouth every evening. Hold until liver function tests are back to normal.   Melatonin 10 MG TABS Take 10 mg by mouth at bedtime as needed (sleep).   mirtazapine (REMERON) 15 MG tablet Take 15 mg by mouth every evening.    montelukast (SINGULAIR) 10  MG tablet TAKE 1 TABLET BY MOUTH EVERY DAY (Patient taking differently: Take 10 mg by mouth every evening. )   omeprazole (PRILOSEC) 10 MG capsule Take 10 mg by mouth in the morning and at bedtime.   No facility-administered encounter medications on file as of 08/21/2020.    PHYSICAL EXAM/ROS:  General: NAD, cooperative Cardiovascular: regular rate and rhythm; denies chest pain Pulmonary: clear ant /post fields Abdomen: soft, nontender, + bowel sounds, no report of constipation GU: no suprapubic tenderness Extremities: no edema, no joint deformities Skin: no rashes to visible skin Neurological: Weakness but otherwise nonfocal; forgetful  Note: Portions of this note were generated with Lobbyist. Dictation errors may occur despite  best attempts at proofreading.  Teodoro Spray, NP

## 2020-08-29 ENCOUNTER — Ambulatory Visit: Payer: Medicare Other | Admitting: Internal Medicine

## 2020-09-02 ENCOUNTER — Telehealth: Payer: Self-pay | Admitting: Pulmonary Disease

## 2020-09-02 NOTE — Telephone Encounter (Signed)
Notified by [facility] answering service that the patient's daughter Kerri Perches requested a call. Returned call and reached daughter.   She states that her mother has had increased shortness of breath this evening. She is also more agitated. [name] checked her mother's SpO2 using a pulse oximeter and it initially read 82%. After calming her mother down somewhat, recheck showed a maximum of 88%.  The patient has a history of asthma and a recent hospital admission for pneumonia in November 2021. She also has diagnoses of Alzheimer's disease and seizure disorder. Palliative care has previously been consulted. The patient has a DNR/DNI order on record. The daughter informs me that her mother has previously stated when she was more lucid that she did not want to return to the hospital under any circumstances, even if she were at risk of imminent death. As such, she tells me that she is seeking information on how best to manage the patient's symptoms at home.  She does not have supplemental oxygen available in the home. She has albuterol nebs and budesonide available at home. I informed [name] that albuterol nebs were the most likely to benefit the patient at this time, both in terms of her hypoxemia and symptomatic relief. She was appreciate of this information and insisted that she would continue to take care of her mother without bringing her to the hospital, as the patient had previously expressed were her wishes.  I will notify the patient's primary pulmonology team Dr. Baltazar Apo and Geraldo Pitter ([facility]) of this call.

## 2020-09-04 ENCOUNTER — Telehealth: Payer: Self-pay | Admitting: Primary Care

## 2020-09-04 NOTE — Telephone Encounter (Signed)
It sounds to me like she is experiencing a slow decline, progression of symptoms. I believe we will need to concentrate on the interventions that will relieve her symptoms and that she can tolerate. I know Palliative Care is involved in her care, and that the patient would like to avoid coming here or going to the hospital if at all possible. I believe we will need to draw on palliative care services to insure that she is not short of breath. I would hold off on further antibiotics for now.

## 2020-09-04 NOTE — Telephone Encounter (Signed)
Called and spoke with [name]. Patient has been admitted to ED in December. O2 sats all over the place, won't eat or drink like she is supposed to. Daughter wants to know if she needs to bring her in to be seen and check O2, or if she needs more antibiotics for pneumonia, states the albuterol nebulizer seems to agitate patient. She is not sure if it is from the medication or if its cause the patient has to have a mask on or what.   Dr. Lamonte Sakai please advise

## 2020-09-04 NOTE — Telephone Encounter (Signed)
Can we see if her DME can go to the home to do an O2 titration, rest and exertion to see if we can qualify her for O2?

## 2020-09-05 ENCOUNTER — Telehealth: Payer: Self-pay | Admitting: Emergency Medicine

## 2020-09-05 ENCOUNTER — Other Ambulatory Visit: Payer: Self-pay

## 2020-09-05 ENCOUNTER — Ambulatory Visit (INDEPENDENT_AMBULATORY_CARE_PROVIDER_SITE_OTHER): Payer: Medicare Other | Admitting: Emergency Medicine

## 2020-09-05 ENCOUNTER — Encounter: Payer: Self-pay | Admitting: Emergency Medicine

## 2020-09-05 VITALS — BP 112/70 | HR 98 | Temp 97.2°F | Ht 61.0 in | Wt 120.2 lb

## 2020-09-05 DIAGNOSIS — F0280 Dementia in other diseases classified elsewhere without behavioral disturbance: Secondary | ICD-10-CM

## 2020-09-05 DIAGNOSIS — G3184 Mild cognitive impairment, so stated: Secondary | ICD-10-CM | POA: Diagnosis not present

## 2020-09-05 DIAGNOSIS — G309 Alzheimer's disease, unspecified: Secondary | ICD-10-CM | POA: Diagnosis not present

## 2020-09-05 DIAGNOSIS — J9601 Acute respiratory failure with hypoxia: Secondary | ICD-10-CM

## 2020-09-05 NOTE — Telephone Encounter (Signed)
She should have oxygen, I have a note from 07/25/20 that states her O2 was 80% on RA with exertion, improved to 97% on 2L. And we send in a new order at that time.

## 2020-09-05 NOTE — Progress Notes (Signed)
Subjective:    Patient ID: Melinda Griffin, female    DOB: [birth year], 80 y.o.   MRN: [id number]  HPI 80 year old minimal tobacco hx (2nd hand smoke exposure), with a history of hyperlipidemia, migraines, followed in our office previously for allergic rhinitis and mild intermittent asthma.  Last seen 11/07/2015. She has Alzheimer's dementia follows at [facility], has had 2 episode aspiration PNA since last visit. She has intermittent aspiration sx and cough w food or drink. [facility] at [facility] has ordered a SLP eval locally, hasn't been done.  CXR 05/12/20 after a choking event reviewed, hyperinflated but clear.  Daughter is her with her today, assists with hx.   Her asthma sx were principally cough, mucous burden, UA noise. She still has a lot of nasal gtt, clears her throat a lot. Formerly has received prednisone but not any time recently. ? Whether she may have some GERD sx based on her daughter's observations. She  She is on Breo, zyrtec, singulair, flonase. Has albuterol, never uses it.   Pulmonary function testing performed 11/07/2015 reviewed by me, shows normal airflows without a bronchodilator response, normal lung volumes and a normal DLCO.  ROV 06/27/20 --this is a follow-up visit for 80 year old woman with a history of dysphagia and chronic aspiration in the setting of Alzheimer's dementia.  Also carries a history of possible asthma although reassuring spirometry in 2017.  At her last visit we stopped her Memory Dance given the concern that it could be exacerbating upper airway irritation.  We continued Zyrtec, Singulair, Flonase and started omeprazole.  She underwent modified barium swallow 06/02/2020 that showed mild oral dysphagia.  Recommend thin liquids, dysphagia 2 and swallowing precautions. She seems to have benefited from the omeprazole - less burning, less UA irritation.  She experienced another syncopal episode 10/20, has a cardiology eval planned.  She has tried stopping her nasal  steroid, has not seemed to miss it.   ROV 09/05/20 --Melinda Griffin is 80 with a history of Alzheimer's dementia with associated dysphagia and chronic aspiration.  Also possible asthma versus upper airway irritation in the setting of allergic rhinitis, chronic GERD.  She was admitted in December for suspected aspiration pneumonia with associated hypoxemic respiratory failure.  Then again 08/02/2020 after a fall and possible seizure-like activity that was characterized as a staring spell.  Her daughter called 1/10 and 1/11 as the patient has been lethargic, will not eat or drink.  She is concerned because she has not wanted to use her albuterol nebulizer.  Daughter helps w hx, She presents today reporting that she had some increased dyspnea for over a week, increased cough. They started taking doxycycline script that they had, finished last Thursday. Has had some recurrent anxiety attacks. She has seen some desaturations to 80's documented at home - Daughter states that pt's O2 was stopped on recent hospitalization. There was a documented desat here in office in November.     Review of Systems As per HPI     Objective:   Physical Exam  Vitals:   09/05/20 1602  BP: 112/70  Pulse: 98  Temp: (!) 97.2 F (36.2 C)  SpO2: 96%  Weight: 120 lb 3.2 oz (54.5 kg)  Height: 5\' 1"  (1.549 m)   Gen: Pleasant, elderly woman, in no distress, in wheelchair  ENT: No lesions,  mouth clear,  oropharynx clear, no postnasal drip  Neck: No JVD, no stridor  Lungs: No use of accessory muscles, no crackles or wheezing on normal respiration  Cardiovascular:  RRR, heart sounds normal, no murmur or gallops, no peripheral edema  Musculoskeletal: No deformities, no cyanosis or clubbing  Neuro: awake, not interacting. She does track. Poor hearing. Relies on her daughter for all of the history.   Skin: Warm, no lesions or rash      Assessment & Plan:  Mild cognitive impairment with memory loss Her cognitive  impairment has progressed, more advanced dementia.  Overall conditions been complicated by intermittent aspiration and pneumonias.  Her daughter treated her empirically for a possible pneumonia/bronchitis with doxycycline last week which was probably appropriate.  The patient is having behavioral issues, panic attacks, acting out, spells of coughing that I suspect are related to reflux and aspiration.  On evaluation today she does not have any respiratory compromise, does not have any evidence of recurrent pneumonia on exam.  Her overall condition is declining.  I think both the patient and her daughter would benefit from a transition to hospice.  They do already have consultation relationship with palliative care.  Clearly her daughter is overwhelmed with taking care of Melinda Griffin at this juncture.  I do not see any role for extending her antibiotics right now.  I asked them to use albuterol judiciously, no more than every 4 hours in order to avoid the known side effect of potential agitation.  I will attempt to communicate with Fredda Hammed at [facility].   Acute respiratory failure ([place]) She had a documented desat in office in November. We will reorder her O2  Baltazar Apo, MD, PhD 09/05/2020, 5:18 PM [facility] [phone] or if no answer [phone]

## 2020-09-05 NOTE — Patient Instructions (Signed)
Dr. Lamonte Sakai will review your case and current condition with your primary care provider Fredda Hammed.  You may benefit from a transition to hospice services for your Alzheimer's, associated dysphagia. I do not believe we need to extend your antibiotic treatment right now You can continue to use albuterol either 2 puffs or 1 nebulizer treatment up to every 4 hours We will arrange for a telephone visit with Dr. Lamonte Sakai in about 1 month or next available

## 2020-09-05 NOTE — Assessment & Plan Note (Signed)
She had a documented desat in office in November. We will reorder her O2

## 2020-09-05 NOTE — Telephone Encounter (Signed)
Please send in a new order for O2 at 2L/min based on desat data from E. Walsh visit in Nov.  Also please have her start doxycycline 100mg  bid x 7 days.   OK to get her an OV or televisit w me or APP next available.

## 2020-09-05 NOTE — Telephone Encounter (Signed)
ATC [name]. LM for [name] to call back with Dr. Agustina Caroli recommendations.

## 2020-09-05 NOTE — Telephone Encounter (Signed)
Spoke with the pt's daughter, [name]  She states that the pt needing to be seen by Dr Lamonte Sakai  She states the hospital took away her o2, and now she is having to stay in bed doing nothing to keep sats above 90%  She is still not eating or drinking and is using neb about every 2 hours  Do you want to work her in Dr Lamonte Sakai?

## 2020-09-05 NOTE — Assessment & Plan Note (Signed)
Her cognitive impairment has progressed, more advanced dementia.  Overall conditions been complicated by intermittent aspiration and pneumonias.  Her daughter treated her empirically for a possible pneumonia/bronchitis with doxycycline last week which was probably appropriate.  The patient is having behavioral issues, panic attacks, acting out, spells of coughing that I suspect are related to reflux and aspiration.  On evaluation today she does not have any respiratory compromise, does not have any evidence of recurrent pneumonia on exam.  Her overall condition is declining.  I think both the patient and her daughter would benefit from a transition to hospice.  They do already have consultation relationship with palliative care.  Clearly her daughter is overwhelmed with taking care of Mrs. Capurro at this juncture.  I do not see any role for extending her antibiotics right now.  I asked them to use albuterol judiciously, no more than every 4 hours in order to avoid the known side effect of potential agitation.  I will attempt to communicate with Fredda Hammed at [facility].

## 2020-09-05 NOTE — Telephone Encounter (Signed)
Called and spoke with pt's [name] letting her know the info stated by RB. When stated to her that [place] said to send round of doxy abx to pharmacy for pt, she stated that pt actually just finished a round of doxy that had been prescribed prior by office but pt was then hospitalized and only had taken one day of the doxy. Stated to [name] that I would check with RB to see if he wanted her to have another round of abx since pt just finished a round of doxy and she verbalized understanding.  While speaking with [name] about pt's O2, she wanted to know if there was any other DME avail as she feels like she might need to take pt back to the hospital as pt has been doing numerous albuterol treatments and pt is still having a lot of problems with her breathing. She said that they have been checking pt's sats and pt has been satting at 90%.  Stated to her that we could get pt scheduled for an OV today 09/05/20 with RB and she stated that that would be good. Pt is scheduled for a visit with RB to further discuss pt's symptoms. Nothing further needed.

## 2020-09-05 NOTE — Telephone Encounter (Signed)
Pt has been scheduled for an OV with RB today 09/05/20. Nothing further needed.

## 2020-09-05 NOTE — Telephone Encounter (Signed)
Called and spoke with Melissa at [facility].  Melissa stated DME's can not send anyone out to check sats for O2, because it is considered a conflict of interest. Melissa stated that anyone with a pulse ox can check sats during a video/tele visit at this time, because of the pandemic.  The provider has to make sure O2 sats are documented in Patients chart as resting and exertion on room air.  Message routed to Dr. Lamonte Sakai

## 2020-09-06 ENCOUNTER — Telehealth: Payer: Self-pay | Admitting: Emergency Medicine

## 2020-09-06 NOTE — Telephone Encounter (Signed)
OK to cancel.

## 2020-09-06 NOTE — Telephone Encounter (Signed)
Spoke with the pt's daughter, [name]  I advised her that the o2 order is not being accepted by DME- [employer] due to outdated testing  Offered appt to re certify and she stated that the pt did not qualify at her visit 07/06/21  She does not think o2 is needed now  Looks like referral made to Hospice at yesterday's visit  Dr Lamonte Sakai- is it okay to cancel the o2 order that we had placed? Or do you want her to try again later for o2 re cert? Thanks!

## 2020-09-06 NOTE — Addendum Note (Signed)
Addended by: Lia Foyer R on: 09/06/2020 05:07 PM   Modules accepted: Orders

## 2020-09-06 NOTE — Telephone Encounter (Signed)
Order for oxygen has been canceled. Will route to [facility] to make them aware. Nothing further needed at this time.

## 2020-09-06 NOTE — Telephone Encounter (Signed)
DME O2 order response from [name] w/ [employer]:   Melinda Griffin, this patient does not qualify for O2 per DX and test date is past 63 days old.   Thanks

## 2020-09-06 NOTE — Telephone Encounter (Signed)
Spoke with Melinda Griffin to inform her that per Dr. Lamonte Sakai pt's PCP would need to make the decision on who the hospice attendee will be. Melinda Griffin stated understanding. Nothing further needed at this time.

## 2020-09-08 ENCOUNTER — Telehealth: Payer: Self-pay | Admitting: Emergency Medicine

## 2020-09-08 NOTE — Telephone Encounter (Signed)
Dr. Lamonte Sakai, please see mychart message sent by pt's daughter.

## 2020-09-08 NOTE — Telephone Encounter (Signed)
Spoke with Claybon Jabs, PA, and let her know that I referred Mrs. W to hospice for her dementia, overall debilitation.

## 2020-09-14 NOTE — Telephone Encounter (Signed)
Daughter emailed today 09/14/20  I wanted to let you know mom passed at home Monday evening. I appreciate all you did to support [place]! [name]

## 2020-09-15 NOTE — Telephone Encounter (Signed)
Thank you very much for letting me know ?

## 2020-09-26 ENCOUNTER — Ambulatory Visit: Payer: Medicare Other | Admitting: Emergency Medicine

## 2020-09-26 DEATH — deceased

## 2021-09-28 IMAGING — CT CT HEAD W/O CM
3 series · 15 of 47 positions shown, 18 images · non-contrast
Comparison: 12/09/2017

CLINICAL DATA: Fall, laceration to right side of head

EXAM:
CT HEAD WITHOUT CONTRAST
CT CERVICAL SPINE WITHOUT CONTRAST
TECHNIQUE: Multidetector CT imaging of the head and cervical spine was
performed following the standard protocol without intravenous
contrast. Multiplanar CT image reconstructions of the cervical spine
were also generated.

[Series 2: head wo · axial · 0.47mm/px · z∈[+1631,+1756]mm · 9 of 31 slices shown, 12 images]
[im 3/31  brain]
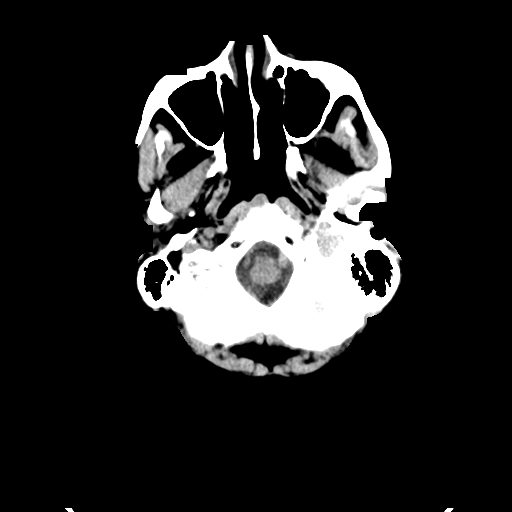
[im 3/31  bone]
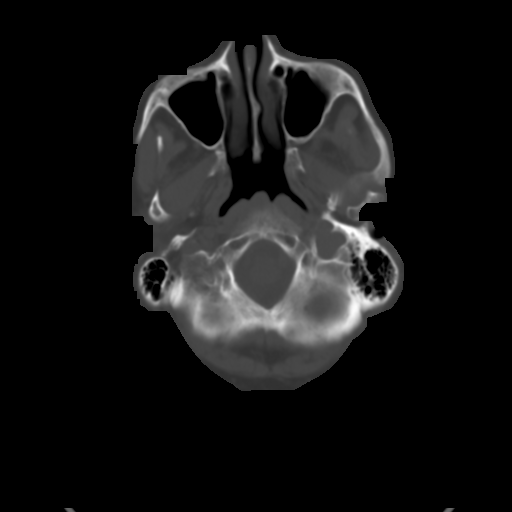
[im 6/31  brain]
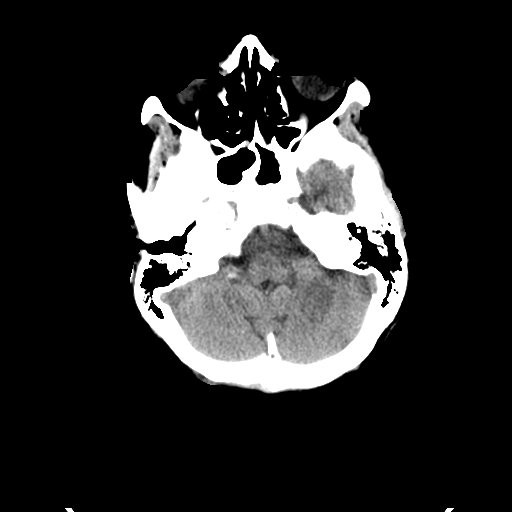
[im 9/31  brain]
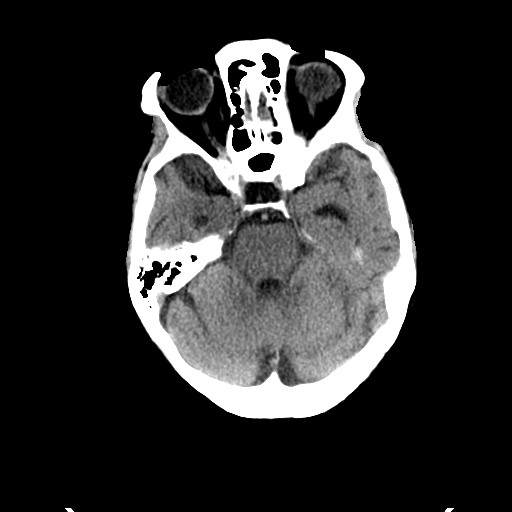
[im 12/31  brain]
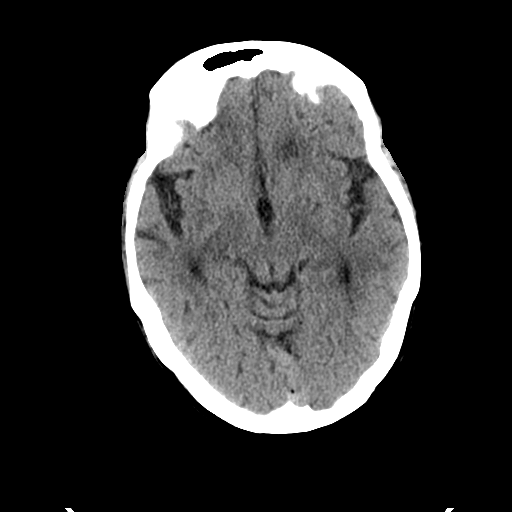
[im 16/31  brain]
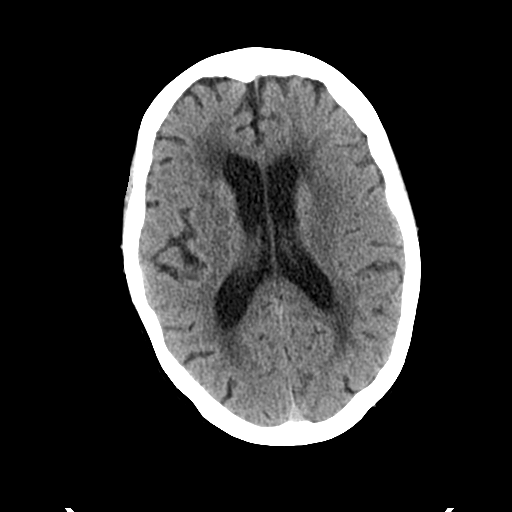
[im 16/31  bone]
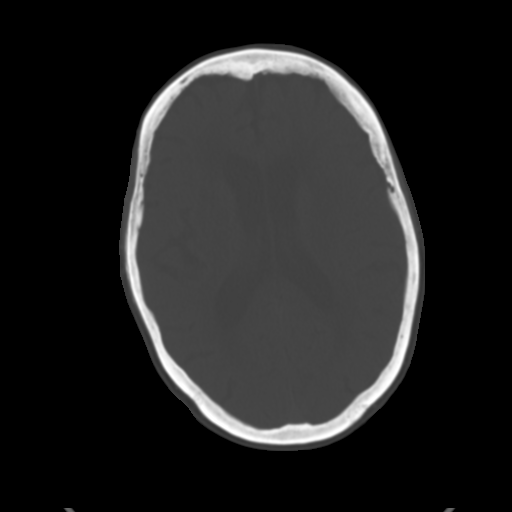
[im 19/31  brain]
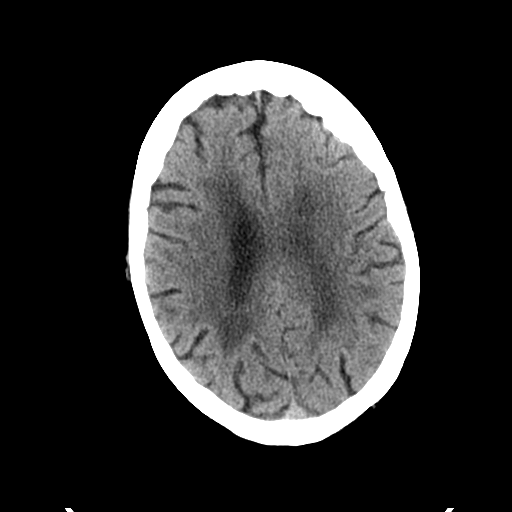
[im 22/31  brain]
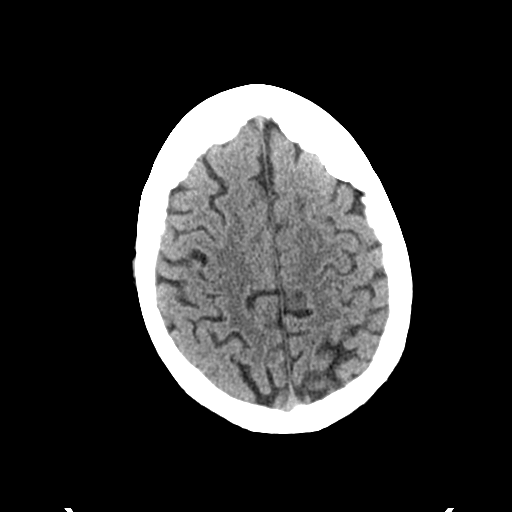
[im 25/31  brain]
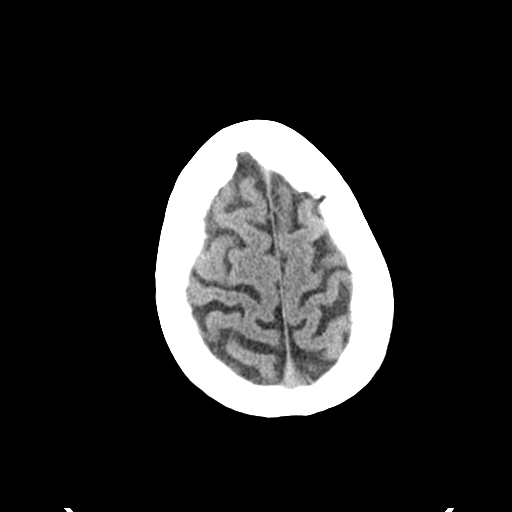
[im 28/31  brain]
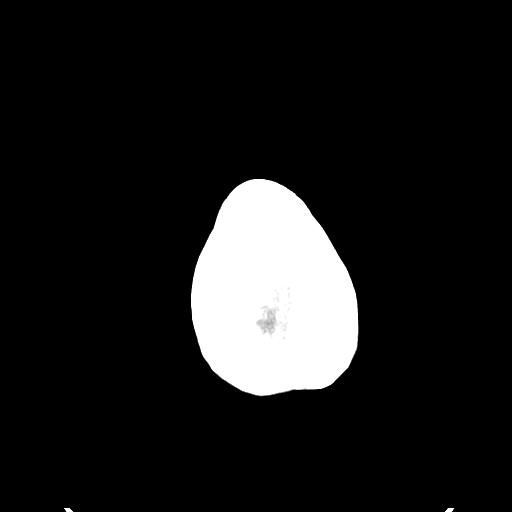
[im 28/31  bone]
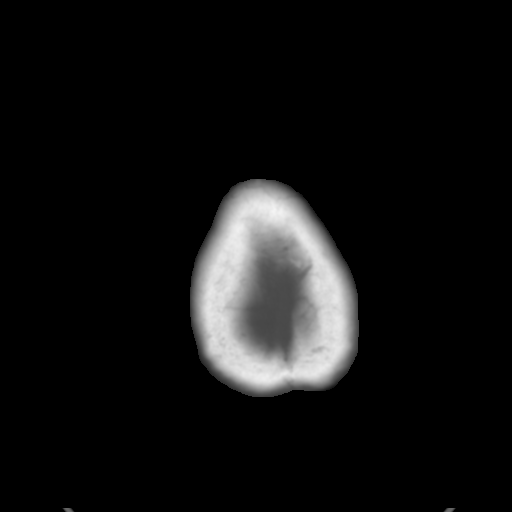

[Series 4: coronal soft tissue · coronal · 0.34mm/px · 3 of 65 slices shown]
[im 22/65  brain]
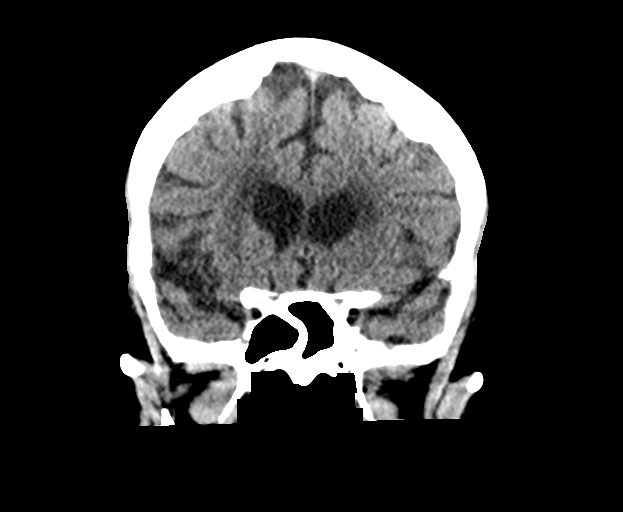
[im 29/65  brain]
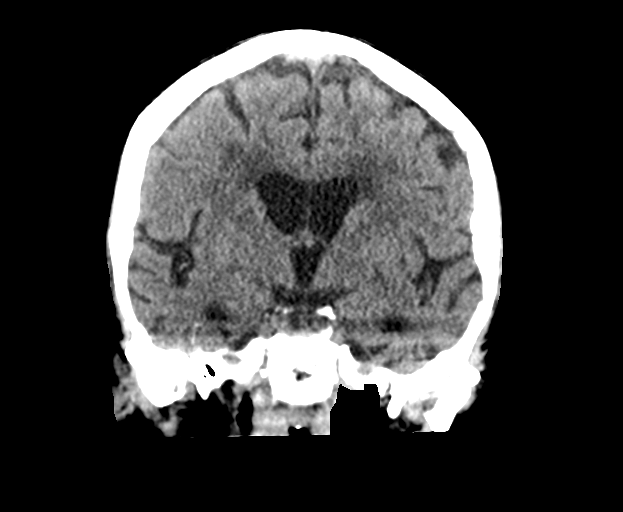
[im 36/65  brain]
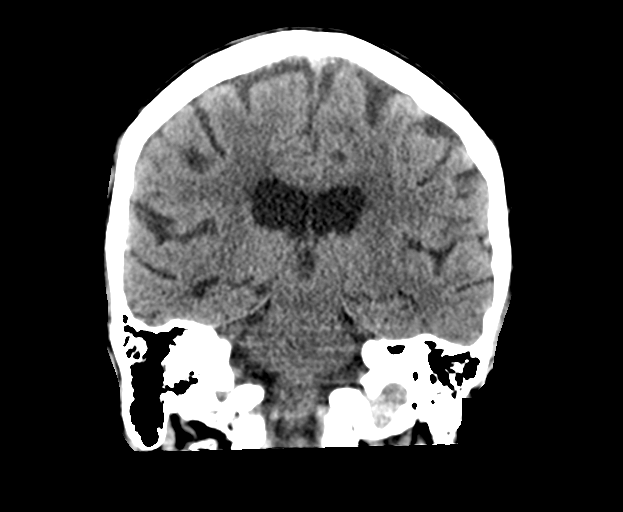

[Series 5: sagittal soft tissue · sagittal · 0.40mm/px · 3 of 50 slices shown]
[im 17/50  brain]
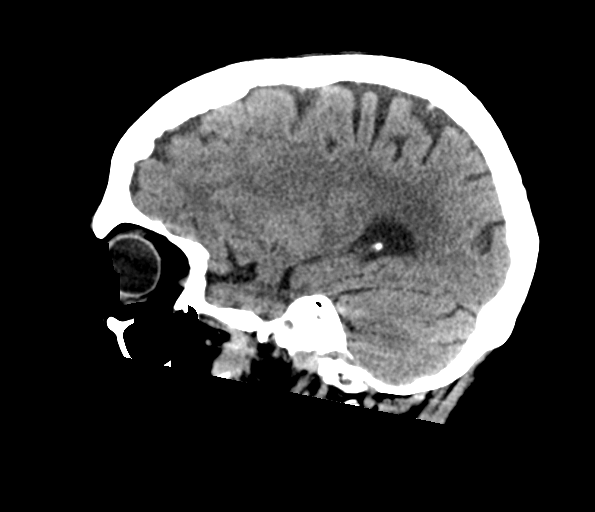
[im 25/50  brain]
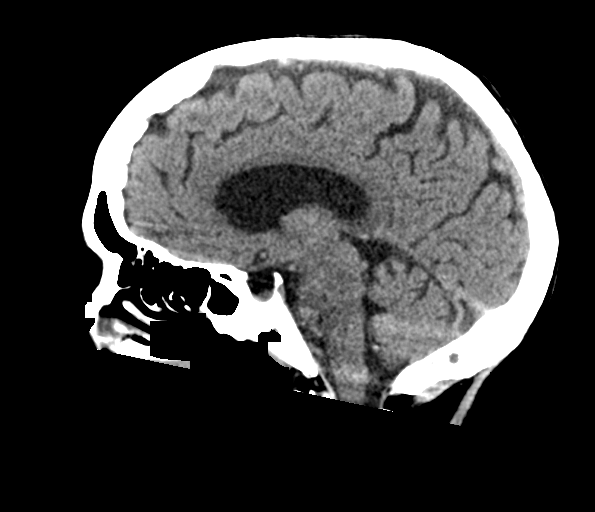
[im 33/50  brain]
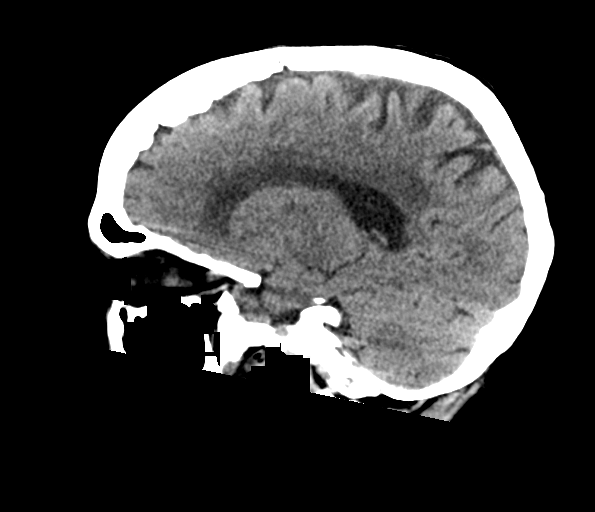

[15 of 47 positions shown; findings below may reference images not displayed]

FINDINGS: CT HEAD FINDINGS

Brain: No evidence of acute infarction, hemorrhage, hydrocephalus,
extra-axial collection or mass lesion/mass effect. Periventricular
and deep white matter hypodensity.

Vascular: No hyperdense vessel or unexpected calcification.

Skull: Normal. Negative for fracture or focal lesion.

Sinuses/Orbits: No acute finding.

Other: Soft tissue contusion and laceration to the right parietal
scalp.

CT CERVICAL SPINE FINDINGS

Alignment: Normal.

Skull base and vertebrae: No acute fracture. No primary bone lesion
or focal pathologic process.

Soft tissues and spinal canal: No prevertebral fluid or swelling. No
visible canal hematoma.

Disc levels:  Mild multilevel disc space height loss.

Upper chest: Negative.

Other: None.
IMPRESSION: 1. No acute intracranial pathology. Small-vessel white matter
disease.

2. Soft tissue contusion and laceration to the right parietal scalp.

3.  No fracture or static subluxation of the cervical spine.

## 2022-05-09 IMAGING — DX DG CHEST 1V PORT
1 series · 1 of 1 positions shown · non-contrast
Comparison: 05/11/2020

CLINICAL DATA: Syncope

EXAM:
PORTABLE CHEST 1 VIEW

[chest ap]
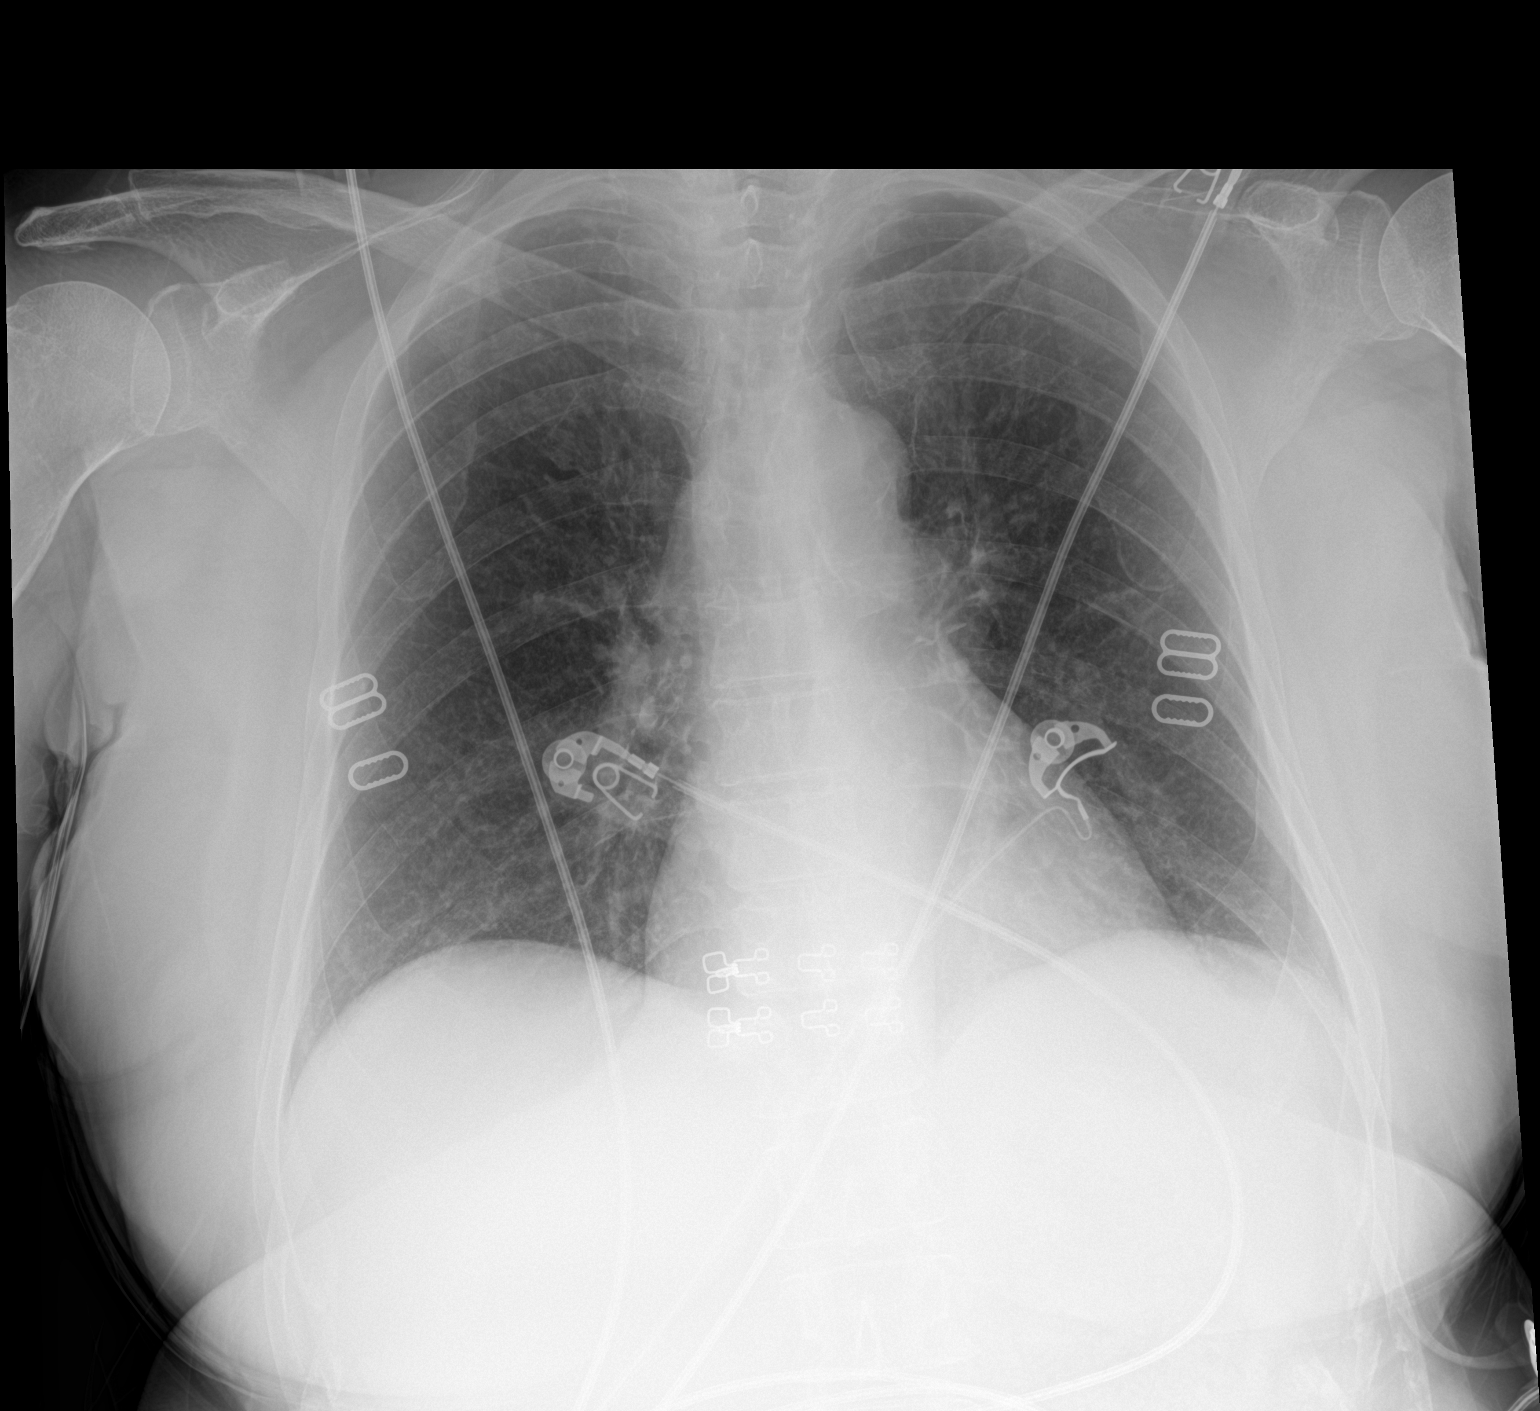

[1 of 1 positions shown; findings below may reference images not displayed]

FINDINGS: Heart and mediastinal contours are within normal limits. No focal
opacities or effusions. No acute bony abnormality.
IMPRESSION: No active disease.
